# Patient Record
Sex: Male | Born: 1960 | Race: Black or African American | Hispanic: No | Marital: Married | Smoking: Former smoker
Health system: Southern US, Community
[De-identification: ages and names within clinical notes are randomized; demographics above are authoritative.]

## PROBLEM LIST (undated history)

## (undated) DIAGNOSIS — K760 Fatty (change of) liver, not elsewhere classified: Secondary | ICD-10-CM

## (undated) DIAGNOSIS — E785 Hyperlipidemia, unspecified: Secondary | ICD-10-CM

## (undated) DIAGNOSIS — K219 Gastro-esophageal reflux disease without esophagitis: Secondary | ICD-10-CM

## (undated) DIAGNOSIS — N400 Enlarged prostate without lower urinary tract symptoms: Secondary | ICD-10-CM

## (undated) DIAGNOSIS — E119 Type 2 diabetes mellitus without complications: Secondary | ICD-10-CM

## (undated) HISTORY — PX: HERNIA REPAIR: SHX51

## (undated) HISTORY — DX: Benign prostatic hyperplasia without lower urinary tract symptoms: N40.0

## (undated) HISTORY — DX: Gastro-esophageal reflux disease without esophagitis: K21.9

## (undated) HISTORY — PX: TREATMENT FISTULA ANAL: SUR1390

## (undated) HISTORY — PX: APPENDECTOMY: SHX54

## (undated) HISTORY — DX: Fatty (change of) liver, not elsewhere classified: K76.0

---

## 1999-05-28 ENCOUNTER — Ambulatory Visit (HOSPITAL_COMMUNITY): Admission: RE | Admit: 1999-05-28 | Discharge: 1999-05-28 | Payer: Self-pay | Admitting: *Deleted

## 2000-08-01 ENCOUNTER — Emergency Department (HOSPITAL_COMMUNITY): Admission: EM | Admit: 2000-08-01 | Discharge: 2000-08-01 | Payer: Self-pay | Admitting: Emergency Medicine

## 2002-02-12 ENCOUNTER — Encounter: Payer: Self-pay | Admitting: Emergency Medicine

## 2002-02-12 ENCOUNTER — Emergency Department (HOSPITAL_COMMUNITY): Admission: EM | Admit: 2002-02-12 | Discharge: 2002-02-12 | Payer: Self-pay

## 2002-02-14 ENCOUNTER — Encounter: Payer: Self-pay | Admitting: *Deleted

## 2002-02-14 ENCOUNTER — Ambulatory Visit (HOSPITAL_COMMUNITY): Admission: RE | Admit: 2002-02-14 | Discharge: 2002-02-14 | Payer: Self-pay

## 2006-06-13 ENCOUNTER — Emergency Department (HOSPITAL_COMMUNITY): Admission: EM | Admit: 2006-06-13 | Discharge: 2006-06-13 | Payer: Self-pay | Admitting: Emergency Medicine

## 2006-10-16 ENCOUNTER — Ambulatory Visit: Payer: Self-pay | Admitting: Family Medicine

## 2006-10-26 ENCOUNTER — Encounter: Payer: Self-pay | Admitting: Family Medicine

## 2006-10-27 ENCOUNTER — Encounter: Payer: Self-pay | Admitting: Family Medicine

## 2006-10-27 DIAGNOSIS — E785 Hyperlipidemia, unspecified: Secondary | ICD-10-CM

## 2006-10-27 LAB — CONVERTED CEMR LAB
Albumin: 4.2 g/dL (ref 3.5–5.2)
BUN: 12 mg/dL (ref 6–23)
Calcium: 9.4 mg/dL (ref 8.4–10.5)
Chloride: 101 meq/L (ref 96–112)
Glucose, Bld: 80 mg/dL (ref 70–99)
HDL: 41 mg/dL (ref >39)
PSA: 0.97 ng/mL (ref 0.10–4.00)
Potassium: 4.6 meq/L (ref 3.5–5.3)
Sodium: 138 meq/L (ref 135–145)
Total Protein: 7.8 g/dL (ref 6.0–8.3)
Triglycerides: 241 mg/dL — ABNORMAL HIGH (ref <150)

## 2007-11-08 ENCOUNTER — Ambulatory Visit: Payer: Self-pay | Admitting: Family Medicine

## 2007-11-13 ENCOUNTER — Encounter: Payer: Self-pay | Admitting: Family Medicine

## 2007-11-21 ENCOUNTER — Encounter: Payer: Self-pay | Admitting: Family Medicine

## 2007-11-21 LAB — CONVERTED CEMR LAB
ALT: 33 units/L (ref 0–53)
Albumin: 4.1 g/dL (ref 3.5–5.2)
BUN: 12 mg/dL (ref 6–23)
CO2: 23 meq/L (ref 19–32)
Calcium: 9.4 mg/dL (ref 8.4–10.5)
Chloride: 104 meq/L (ref 96–112)
Cholesterol, target level: 200 mg/dL
Cholesterol: 245 mg/dL — ABNORMAL HIGH (ref 0–200)
Creatinine, Ser: 0.85 mg/dL (ref 0.40–1.50)
HDL goal, serum: 40 mg/dL
LDL Goal: 160 mg/dL
PSA: 0.98 ng/mL (ref 0.10–4.00)
Potassium: 4.7 meq/L (ref 3.5–5.3)
Total CHOL/HDL Ratio: 6.4

## 2009-01-14 ENCOUNTER — Ambulatory Visit: Payer: Self-pay | Admitting: Family Medicine

## 2009-05-05 ENCOUNTER — Ambulatory Visit: Payer: Self-pay | Admitting: Family Medicine

## 2009-05-08 ENCOUNTER — Encounter: Payer: Self-pay | Admitting: Family Medicine

## 2009-05-08 LAB — CONVERTED CEMR LAB
ALT: 25 units/L (ref 0–53)
AST: 18 units/L (ref 0–37)
Albumin: 4.1 g/dL (ref 3.5–5.2)
Alkaline Phosphatase: 72 units/L (ref 39–117)
Glucose, Bld: 72 mg/dL (ref 70–99)
LDL Cholesterol: 163 mg/dL — ABNORMAL HIGH (ref 0–99)
Potassium: 4.8 meq/L (ref 3.5–5.3)
Sodium: 139 meq/L (ref 135–145)
Total Bilirubin: 0.6 mg/dL (ref 0.3–1.2)
Total Protein: 7.7 g/dL (ref 6.0–8.3)
VLDL: 42 mg/dL — ABNORMAL HIGH (ref 0–40)

## 2009-05-15 ENCOUNTER — Ambulatory Visit: Payer: Self-pay | Admitting: Family Medicine

## 2009-05-25 ENCOUNTER — Encounter: Admission: RE | Admit: 2009-05-25 | Discharge: 2009-06-17 | Payer: Self-pay | Admitting: Family Medicine

## 2009-05-25 ENCOUNTER — Telehealth: Payer: Self-pay | Admitting: Family Medicine

## 2009-05-25 ENCOUNTER — Encounter: Payer: Self-pay | Admitting: Family Medicine

## 2009-05-28 ENCOUNTER — Ambulatory Visit: Payer: Self-pay | Admitting: Family Medicine

## 2009-06-05 ENCOUNTER — Encounter (INDEPENDENT_AMBULATORY_CARE_PROVIDER_SITE_OTHER): Payer: Self-pay | Admitting: *Deleted

## 2009-06-05 ENCOUNTER — Ambulatory Visit: Payer: Self-pay | Admitting: Family Medicine

## 2009-06-05 ENCOUNTER — Encounter: Admission: RE | Admit: 2009-06-05 | Discharge: 2009-06-05 | Payer: Self-pay | Admitting: Family Medicine

## 2009-06-12 ENCOUNTER — Ambulatory Visit: Payer: Self-pay | Admitting: Family Medicine

## 2009-06-15 ENCOUNTER — Telehealth: Payer: Self-pay | Admitting: Family Medicine

## 2009-06-15 ENCOUNTER — Encounter: Payer: Self-pay | Admitting: Family Medicine

## 2009-06-16 ENCOUNTER — Encounter: Payer: Self-pay | Admitting: Family Medicine

## 2009-06-23 ENCOUNTER — Encounter: Payer: Self-pay | Admitting: Family Medicine

## 2009-07-30 ENCOUNTER — Encounter: Admission: RE | Admit: 2009-07-30 | Discharge: 2009-07-30 | Payer: Self-pay | Admitting: Orthopedic Surgery

## 2009-08-02 ENCOUNTER — Encounter: Admission: RE | Admit: 2009-08-02 | Discharge: 2009-08-02 | Payer: Self-pay | Admitting: Orthopedic Surgery

## 2009-10-19 ENCOUNTER — Telehealth (INDEPENDENT_AMBULATORY_CARE_PROVIDER_SITE_OTHER): Payer: Self-pay | Admitting: *Deleted

## 2009-10-22 ENCOUNTER — Encounter: Payer: Self-pay | Admitting: Family Medicine

## 2009-10-23 LAB — CONVERTED CEMR LAB
ALT: 25 units/L (ref 0–53)
AST: 19 units/L (ref 0–37)
CO2: 24 meq/L (ref 19–32)
Calcium: 9.2 mg/dL (ref 8.4–10.5)
Chloride: 103 meq/L (ref 96–112)
Cholesterol: 183 mg/dL (ref 0–200)
Creatinine, Ser: 0.87 mg/dL (ref 0.40–1.50)
Potassium: 4.5 meq/L (ref 3.5–5.3)
Sodium: 138 meq/L (ref 135–145)
Total CHOL/HDL Ratio: 4.3
Total Protein: 7 g/dL (ref 6.0–8.3)

## 2009-10-28 ENCOUNTER — Encounter: Payer: Self-pay | Admitting: Family Medicine

## 2009-11-26 ENCOUNTER — Ambulatory Visit: Payer: Self-pay | Admitting: Family

## 2009-11-26 LAB — CONVERTED CEMR LAB: Rapid Strep: NEGATIVE

## 2009-12-31 ENCOUNTER — Ambulatory Visit: Payer: Self-pay | Admitting: Family

## 2009-12-31 DIAGNOSIS — M5136 Other intervertebral disc degeneration, lumbar region: Secondary | ICD-10-CM | POA: Insufficient documentation

## 2009-12-31 DIAGNOSIS — IMO0002 Reserved for concepts with insufficient information to code with codable children: Secondary | ICD-10-CM

## 2010-02-05 ENCOUNTER — Telehealth: Payer: Self-pay | Admitting: Family Medicine

## 2010-02-05 ENCOUNTER — Encounter: Payer: Self-pay | Admitting: Family Medicine

## 2010-04-13 ENCOUNTER — Encounter
Admission: RE | Admit: 2010-04-13 | Discharge: 2010-05-26 | Payer: Self-pay | Source: Home / Self Care | Admitting: Neurological Surgery

## 2010-04-19 ENCOUNTER — Ambulatory Visit: Payer: Self-pay | Admitting: Family Medicine

## 2010-04-20 ENCOUNTER — Telehealth: Payer: Self-pay | Admitting: Family Medicine

## 2010-04-21 ENCOUNTER — Encounter: Payer: Self-pay | Admitting: Family Medicine

## 2010-04-22 LAB — CONVERTED CEMR LAB
CO2: 23 meq/L (ref 19–32)
Calcium: 9.9 mg/dL (ref 8.4–10.5)
Glucose, Bld: 85 mg/dL (ref 70–99)
Potassium: 4.6 meq/L (ref 3.5–5.3)
Sodium: 139 meq/L (ref 135–145)
Triglycerides: 226 mg/dL — ABNORMAL HIGH (ref <150)

## 2010-07-18 ENCOUNTER — Encounter: Payer: Self-pay | Admitting: Orthopedic Surgery

## 2010-07-29 NOTE — Assessment & Plan Note (Signed)
Summary: f/u on back pain- jr   Vital Signs:  Patient profile:   50 year old male Height:      67.5 inches Weight:      281 pounds BMI:     43.52 O2 Sat:      98 % on Room air Pulse rate:   90 / minute BP sitting:   121 / 66  (left arm) Cuff size:   large  Vitals Entered By: Payton Spark CMA (December 31, 2009 1:47 PM)  O2 Flow:  Room air CC: F/U on back pain. Would like referral to neuro surgery   Primary Care Provider:  Metheney, C  CC:  F/U on back pain. Would like referral to neuro surgery.  History of Present Illness: Frederick Yang is a 50 year old male who presents today with complaint of low back pain.  1) Low back pain- reports history of ruptured L5.  Review of E-chart notes MRI from 2/11  which notes foraminal and extraforaminal disc protrusions bilaterally L3-4 and L4-5 and on the left at L5-S1. His pain radiates down his right leg, nearly to his right foot.  Notes generalized weakness in his legs.  Pain initially started approximately 1 year ago.  Patient has been evaluated by Dr. Venita Lick and has undergone PT and ESI without significant improvment.  He is requesting a second opinion from Neurosurgery.  Reports pain today is 5/10-  he occasionally uses tramadol if severe.  Generally does not use medication.  2) Insomnia-has been using melatonin and tylenol PM.  Notes that he falls asleep and then will suddenly awaken- almost feels startled and like he is choking.  Drowsy during the day.  Current Medications (verified): 1)  Simvastatin 40 Mg Tabs (Simvastatin) .... Take 1 Tablet By Mouth Once A Day At Bedtime 2)  Fish Oil 1000 Mg Caps (Omega-3 Fatty Acids) .... 4 Tabs By Mouth Daily.  Allergies (verified): No Known Drug Allergies  Review of Systems       see HPI  Physical Exam  General:  Well-developed,well-nourished,in no acute distress; alert,appropriate and cooperative throughout examination Mouth:  narrow posterior pharynx. Neck:  thick neck Lungs:  Normal  respiratory effort, chest expands symmetrically. Lungs are clear to auscultation, no crackles or wheezes. Heart:  Normal rate and regular rhythm. S1 and S2 normal without gallop, murmur, click, rub or other extra sounds. Neurologic:  strength normal in all extremities.     Detailed Back/Spine Exam  Lumbosacral Exam:  Toe Walking:    Right:  normal    Left:  normal Heel Walking:    Right:  normal    Left:  normal   Impression & Recommendations:  Problem # 1:  BACK PAIN WITH RADICULOPATHY (ICD-729.2) Assessment Deteriorated  50 year old male with severe low back pain with radiculopathy.  Has undergone ESI and PT- now considering surgery, desires second opinion from Neurosurgery.  Will refer.    Orders: Neurosurgeon Referral (Neurosurgeon)  Problem # 2:  SLEEPINESS (ICD-780.09) Assessment: New  I am concerned about possibility of underlying sleep apnea in this patient given his complaints and body habitus.  Will schedule for a sleep study.    Orders: Sleep Disorder Referral (Sleep Disorder)  Complete Medication List: 1)  Simvastatin 40 Mg Tabs (Simvastatin) .... Take 1 tablet by mouth once a day at bedtime 2)  Fish Oil 1000 Mg Caps (Omega-3 fatty acids) .... 4 tabs by mouth daily.  Patient Instructions: 1)  You will be contacted about  your referral for a sleep study and for Neurosurgery.

## 2010-07-29 NOTE — Progress Notes (Signed)
Summary: Doesn' want sleep study  ---- Converted from flag ---- ---- 02/05/2010 1:43 PM, Frederick Bars DO wrote:   ---- 02/05/2010 1:43 PM, Frederick Yang wrote: Patient has decided he does not want his sleep study because he is sleeping better. He thinks it was due to anxiety issues he was having at the time. Thanks ------------------------------

## 2010-07-29 NOTE — Consult Note (Signed)
Summary: [facility]  [facility]   Imported By: Lanelle Bal 02/19/2010 12:41:19  _____________________________________________________________________  External Attachment:    Type:   Image     Comment:   External Document  Appended Document: [facility] Call pt: Neurosurg recommended PT.  Has then been scheduled?  Would he like [place] to schedule it for him?  02/24/2010 @ 11:03am- Pt notified of MD instructions via VM and instructed to call office back with answer. KJ LPN  Appended Document: [facility] 03/02/2010 @ 9:29am- Left several messages for pt to return call regarding whether PT had been set up for him or not and have not yet heard back from pt. KJ LPN

## 2010-07-29 NOTE — Letter (Signed)
Summary: Generic Letter  [facility]  [address], [address]   [place], [place] [address]   Phone: [phone]  Fax: [phone]    10/28/2009  [place] [facility] [address] [place], [place]  [address]  Dear Mr. Canale,   We have received your lab results and have made many attempts to call you but have not heard back from you. Your blood sugar is borderline elevated lets recheck this in 6 months. Cholesterol looks much better on the statin, but your triglycerides are still elevated. Recommend start Fish Oil 1000mg  4 capsules a day.        Sincerely,   Nani Gasser, MD

## 2010-07-29 NOTE — Progress Notes (Signed)
----   Converted from flag ---- ---- 10/19/2009 12:23 PM, Nani Gasser MD wrote: Call pt: Need to recheck lipids and liver before we refill cholesterol pill for 90 days supply ------------------------------  10/19/2009 @ 1:01pm-Pt notified of above. KJ LPN

## 2010-07-29 NOTE — Assessment & Plan Note (Signed)
Summary: sore throat- jr   Vital Signs:  Patient profile:   50 year old male Height:      67.5 inches Weight:      286 pounds Temp:     98.2 degrees F oral Pulse rate:   74 / minute BP sitting:   119 / 64  (left arm) Cuff size:   large  Vitals Entered By: Kathlene November (November 26, 2009 2:18 PM) CC: sore throat, a little drainage in the mornings, denies fever or cough. Sypmtoms since Monday   Primary Care Provider:  Linford Arnold, C  CC:  sore throat, a little drainage in the mornings, and denies fever or cough. Sypmtoms since Monday.  History of Present Illness: [name] is a 50 year old male who presents with complaint of sore throat which started 4 days ago.  Denies associated fever.  Sore throat is worse with swallowing.  Has been salt water gargling without improvement.  Noted some right ear discomfort last night.  Denies fever.  Denies known exposure to strep.  Notes some thick brown mucous in the morning only.    Current Medications (verified): 1)  Simvastatin 40 Mg Tabs (Simvastatin) .... Take 1 Tablet By Mouth Once A Day At Bedtime 2)  Fish Oil 1000 Mg Caps (Omega-3 Fatty Acids) .... 4 Tabs By Mouth Daily.  Allergies (verified): No Known Drug Allergies  Comments:  Nurse/Medical Assistant: The patient's medications and allergies were reviewed with the patient and were updated in the Medication and Allergy Lists. Kathlene November (November 26, 2009 2:19 PM)  Past History:  Past Medical History: Last updated: 10/16/2006 Hx of swollen prostate 10 years ago, now resolved.    Past Surgical History: Last updated: 10/16/2006 Right Groin Hernia 2003 Appendectomy 1993  Family History: Last updated: 10/16/2006 Uncle with alchoholism GF with Lung CA, smoker Mother with Br Ca Aunt with DM Uncle with MI, stroke Sibling with HTN  Social History: Last updated: 11/08/2007 Working at [employer].  HS degree. Married to [place] with 3 children.   Former Smoker Alcohol  use-yes Drug use-no  Regular exercise-yes  Risk Factors: Alcohol Use: <1 (10/16/2006) Caffeine Use: 0 (11/08/2007) Exercise: yes (11/08/2007)  Risk Factors: Smoking Status: quit (10/16/2006)  Physical Exam  General:  Well-developed,well-nourished,in no acute distress; alert,appropriate and cooperative throughout examination Eyes:  PERRLA Ears:  Mild erythema of left TM, R TM normal Mouth:  Oral mucosa and oropharynx without lesions or exudates.  Teeth in good repair. Neck:  No deformities, masses, or tenderness noted. Lungs:  Normal respiratory effort, chest expands symmetrically. Lungs are clear to auscultation, no crackles or wheezes. Heart:  Normal rate and regular rhythm. S1 and S2 normal without gallop, murmur, click, rub or other extra sounds.   Impression & Recommendations:  Problem # 1:  OTITIS MEDIA, ACUTE, LEFT (ICD-382.9) Assessment New Rapid strep is negative.  Will plan to treat OM with amoxicillin. The following medications were removed from the medication list:    Diclofenac Sodium 75 Mg Tbec (Diclofenac sodium) .Marland Kitchen... Take 1 tablet by mouth two times a day His updated medication list for this problem includes:    Amoxicillin 500 Mg Cap (Amoxicillin) .Marland Kitchen... Take 1 capsule by mouth three times a day x 10 days  Complete Medication List: 1)  Simvastatin 40 Mg Tabs (Simvastatin) .... Take 1 tablet by mouth once a day at bedtime 2)  Fish Oil 1000 Mg Caps (Omega-3 fatty acids) .... 4 tabs by mouth daily. 3)  Amoxicillin  500 Mg Cap (Amoxicillin) .... Take 1 capsule by mouth three times a day x 10 days  Other Orders: Rapid Strep ([address])  Patient Instructions: 1)  Gargle twice daily with salt water. 2)  Take Tylenol 650mg  every 6 hours as needed for pain 3)  You may use over the counter Cepacol lozenges or Chloraseptic spray as needed for sore throat. 4)  Call if you develop fever, worsening cough, if symptoms worsen, or if they do not  improve. Prescriptions: AMOXICILLIN 500 MG CAP (AMOXICILLIN) Take 1 capsule by mouth three times a day X 10 days  #30 x 0   Entered and Authorized by:   Lemont Fillers FNP   Signed by:   Lemont Fillers FNP on 11/26/2009   Method used:   Electronically to        [facility]* (retail)       [address] [place], [place]  [address]       Ph: [phone]       Fax: [phone]   RxID:   [id number]   Laboratory Results  Date/Time Received: 11/26/2009 Date/Time Reported: 11/26/2009  Other Tests  Rapid Strep: negative

## 2010-07-29 NOTE — Progress Notes (Signed)
Summary: Needs rx for the fish oil  Phone Note Call from Patient Call back at Home Phone [phone]   Caller: Patient Call For: Nani Gasser MD Summary of Call: Pt needs a rx called into pharmacy for the fish oil for his flex spending account to cover it. Send to [facility] Initial call taken by: Kathlene November LPN,  April 20, 2010 10:26 AM  Follow-up for Phone Call        OK, print and I will sign Follow-up by: Nani Gasser MD,  April 20, 2010 12:18 PM    Prescriptions: FISH OIL 1000 MG CAPS (OMEGA-3 FATTY ACIDS) 4 tabs by mouth daily.  #120 x 2   Entered by:   Kathlene November LPN   Authorized by:   Nani Gasser MD   Signed by:   Kathlene November LPN on 13/01/6577   Method used:   [phone]ail[facility]       [address]       [place], [place]  [address]       Ph: [phone] or [phone]       Fax: [phone]   RxID:   [id number]

## 2010-07-29 NOTE — Assessment & Plan Note (Signed)
Summary: CPE   Vital Signs:  Patient profile:   50 year old male Height:      67.5 inches Weight:      281 pounds Pulse rate:   74 / minute BP sitting:   116 / 75  (right arm) Cuff size:   large  Vitals Entered By: Avon Gully CMA, Duncan Dull) (April 19, 2010 1:02 PM) CC: CPE, need refill on simvistatin   Primary Care Provider:  Linford Arnold, C  CC:  CPE and need refill on simvistatin.  History of Present Illness: CPE, need refill on simvistatin. Started back to the gym about 2 months ago. Has had some back problems that affected his exercise as well. Currently doing PT for his back. He did up his Fish Oil back in May to help his TG.  Current Medications (verified): 1)  Simvastatin 40 Mg Tabs (Simvastatin) .... Take 1 Tablet By Mouth Once A Day At Bedtime 2)  Fish Oil 1000 Mg Caps (Omega-3 Fatty Acids) .... 4 Tabs By Mouth Daily.  Allergies (verified): No Known Drug Allergies  Comments:  Nurse/Medical Assistant: The patient's medications and allergies were reviewed with the patient and were updated in the Medication and Allergy Lists. Avon Gully CMA, Duncan Dull) (April 19, 2010 1:02 PM)  Past History:  Past Medical History: Last updated: 10/16/2006 Hx of swollen prostate 10 years ago, now resolved.    Past Surgical History: Last updated: 10/16/2006 Right Groin Hernia 2003 Appendectomy 1993  Family History: Last updated: 10/16/2006 Uncle with alchoholism GF with Lung CA, smoker Mother with Br Ca Aunt with DM Uncle with MI, stroke Sibling with HTN  Social History: Last updated: 11/08/2007 Working at [employer].  HS degree. Married to [place] with 3 children.   Former Smoker Alcohol use-yes Drug use-no  Regular exercise-yes  Review of Systems  The patient denies anorexia, fever, weight loss, weight gain, vision loss, decreased hearing, hoarseness, chest pain, syncope, dyspnea on exertion, peripheral edema, prolonged cough, headaches,  hemoptysis, abdominal pain, melena, hematochezia, severe indigestion/heartburn, hematuria, incontinence, genital sores, muscle weakness, suspicious skin lesions, transient blindness, difficulty walking, depression, unusual weight change, enlarged lymph nodes, angioedema, and testicular masses.         Notice an itching below his umbilicus.  No urinary sxs or rash.   Physical Exam  General:  Well-developed,well-nourished,in no acute distress; alert,appropriate and cooperative throughout examination Head:  Normocephalic and atraumatic without obvious abnormalities. No apparent alopecia or balding. Eyes:  No corneal or conjunctival inflammation noted. EOMI. Perrla.  Ears:  External ear exam shows no significant lesions or deformities.  Otoscopic examination reveals clear canals, tympanic membranes are intact bilaterally without bulging, retraction, inflammation or discharge. Hearing is grossly normal bilaterally. Nose:  External nasal examination shows no deformity or inflammation.  Mouth:  Oral mucosa and oropharynx without lesions or exudates.  Teeth in good repair. Neck:  No deformities, masses, or tenderness noted. Chest Wall:  No deformities, masses, tenderness or gynecomastia noted. Lungs:  Normal respiratory effort, chest expands symmetrically. Lungs are clear to auscultation, no crackles or wheezes. Heart:  Normal rate and regular rhythm. S1 and S2 normal without gallop, murmur, click, rub or other extra sounds. Abdomen:  Bowel sounds positive,abdomen soft and non-tender without masses, organomegaly or hernias noted. Msk:  No deformity or scoliosis noted of thoracic or lumbar spine.   Pulses:  R and L carotid,radial,dorsalis pedis and posterior tibial pulses are full and equal bilaterally Extremities:  No clubbing, cyanosis, edema, or deformity noted with normal  full range of motion of all joints.   Neurologic:  No cranial nerve deficits noted. Station and gait are normal. DTRs are  symmetrical throughout. Sensory, motor and coordinative functions appear intact. Skin:  no rashes.   Cervical Nodes:  No lymphadenopathy noted Psych:  Cognition and judgment appear intact. Alert and cooperative with normal attention span and concentration. No apparent delusions, illusions, hallucinations   Impression & Recommendations:  Problem # 1:  EXAMINATION, ROUTINE MEDICAL (ICD-V70.0) Exam is normal except for obesity Continue wtih his regular exercise. Encourage weight los and dec sweets, carbs as well Due to recheck TG and make sure at gaol in inc dose of Fish OIl.  Tdap given today. Getting flu vac through work.  Orders: T-Basic Metabolic Panel [id number]) T-Triglycerides [id number])  Complete Medication List: 1)  Simvastatin 40 Mg Tabs (Simvastatin) .... Take 1 tablet by mouth once a day at bedtime 2)  Fish Oil 1000 Mg Caps (Omega-3 fatty acids) .... 4 tabs by mouth daily.  Other Orders: Tdap => 50yrs IM (29562) Admin 1st Vaccine ([address]86)  Patient Instructions: 1)  It is important that you exercise reguarly at least 20 minutes 5 times a week. If you develop chest pain, have severe difficulty breathing, or feel very tired, stop exercising immediately and seek medical attention.  2)  You need to lose weight. Consider a lower calorie diet and regular exercise.   Contraindications/Deferment of Procedures/Staging:    Test/Procedure: FLU VAX    Reason for deferment: patient declined  Prescriptions: SIMVASTATIN 40 MG TABS (SIMVASTATIN) Take 1 tablet by mouth once a day at bedtime  #90 x 3   Entered and Authorized by:   Nani Gasser MD   Signed by:   Nani Gasser MD on 04/19/2010   Method used:   [facility]        [facility]  [address] [phone][facility]       [address] [place], [place]  [address]       Ph: [phone] or [phone]       Fax: [phone]   RxID:   [id number]    Orders Added: 1)  T-Basic Metabolic Panel  [[id number]] 2)  T-Triglycerides [10932-35573] 3)  Tdap => 63yrs IM [22025] 4)  Admin 1st Vaccine [90471] 5)  Est. Patient age 74-64 [99396]   Immunizations Administered:  Tetanus Vaccine:    Vaccine Type: Tdap    Site: left deltoid    Mfr: GlaxoSmithKline    Dose: 0.5 ml    Route: IM    Given by: Sue Lush McCrimmon CMA, (AAMA)    Exp. Date: 04/15/2012    Lot #: [id number]    VIS given: 05/14/08 version given April 19, 2010.   Immunizations Administered:  Tetanus Vaccine:    Vaccine Type: Tdap    Site: left deltoid    Mfr: GlaxoSmithKline    Dose: 0.5 ml    Route: IM    Given by: Sue Lush McCrimmon CMA, (AAMA)    Exp. Date: 04/15/2012    Lot #: [id number]    VIS given: 05/14/08 version given April 19, 2010.

## 2010-09-10 ENCOUNTER — Ambulatory Visit (INDEPENDENT_AMBULATORY_CARE_PROVIDER_SITE_OTHER): Payer: Private Health Insurance - Indemnity | Admitting: Family Medicine

## 2010-09-10 ENCOUNTER — Encounter: Payer: Self-pay | Admitting: Family Medicine

## 2010-09-10 DIAGNOSIS — M542 Cervicalgia: Secondary | ICD-10-CM

## 2010-09-10 DIAGNOSIS — IMO0002 Reserved for concepts with insufficient information to code with codable children: Secondary | ICD-10-CM

## 2010-09-10 DIAGNOSIS — M7541 Impingement syndrome of right shoulder: Secondary | ICD-10-CM | POA: Insufficient documentation
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
## 2011-01-29 ENCOUNTER — Encounter: Payer: Self-pay | Admitting: Family Medicine

## 2011-02-03 ENCOUNTER — Ambulatory Visit (INDEPENDENT_AMBULATORY_CARE_PROVIDER_SITE_OTHER): Payer: Private Health Insurance - Indemnity | Admitting: Family Medicine

## 2011-02-03 ENCOUNTER — Encounter: Payer: Self-pay | Admitting: Family Medicine

## 2011-02-03 VITALS — BP 118/70 | HR 61 | Ht 67.0 in | Wt 292.0 lb

## 2011-02-03 DIAGNOSIS — Z0000 Encounter for general adult medical examination without abnormal findings: Secondary | ICD-10-CM

## 2011-02-03 MED ORDER — PANTOPRAZOLE SODIUM 40 MG PO TBEC: 40.0000 mg | DELAYED_RELEASE_TABLET | Freq: Every day | ORAL | Status: DC

## 2011-02-03 NOTE — Patient Instructions (Signed)
Start a regular exercise program and make sure you are eating a healthy diet Try to eat 4 servings of dairy a day or take a calcium supplement (500mg  twice a day). Your vaccines are up to date.  We will call you with your lab results.

## 2011-02-03 NOTE — Progress Notes (Signed)
Subjective:    Patient ID: Frederick Yang, male    DOB: [birth year], 50 y.o.   MRN: [id number]  HPI Has concerns about reflux- Says has been having more frequent heartburn. Tried prilosec OTC for 2 weeks and it helped some but then a week later sxs recurred.    Insomnia - On and off for the last year.  Says initially took melatonin and that has helped. Says it got better and then recently started again.Says hard time falling asleep but has also been waking up feelings started. No nightmares or excessive dreams. No recent mood change or inc in stress.     Review of Systems Comprehensive ROS except for above.     BP 118/70  Pulse 61  Ht 5\' 7"  (1.702 m)  Wt 292 lb (132.45 kg)  BMI 45.73 kg/m2    No Known Allergies  Past Medical History  Diagnosis Date  . Prostate hypertrophy     Past Surgical History  Procedure Date  . Hernia repair     RT groin  . Appendectomy   . Treatment fistula anal     History   Social History  . Marital Status: Married    Spouse Name: N/A    Number of Children: 3   . Years of Education: N/A   Occupational History  .  [employer]   Social History Main Topics  . Smoking status: Former Smoker    Types: Cigarettes    Quit date: 06/27/1988  . Smokeless tobacco: Not on file  . Alcohol Use: Yes  . Drug Use: No  . Sexually Active: Not on file   Other Topics Concern  . Not on file   Social History Narrative   Started some exercise. Occ caffeine but not daily.     Family History  Problem Relation Age of Onset  . Breast cancer Mother 30  . Alcohol abuse Maternal Uncle   . Heart attack Maternal Uncle   . Stroke Maternal Uncle   . Cancer Maternal Grandfather     lung  . Diabetes Maternal Aunt   . Hypertension Brother   . Hypertension Sister   . Diabetes Sister     Mr. Frederick Yang does not currently have medications on file.  Objective:   Physical Exam  Constitutional: He is oriented to person, place, and time. He appears  well-developed and well-nourished.  HENT:  Head: Normocephalic and atraumatic.  Right Ear: External ear normal.  Left Ear: External ear normal.  Nose: Nose normal.  Mouth/Throat: Oropharynx is clear and moist.       TMs and canals are clear bilat   Eyes: Conjunctivae and EOM are normal. Pupils are equal, round, and reactive to light.  Neck: Normal range of motion. Neck supple. No thyromegaly present.  Cardiovascular: Normal rate, regular rhythm, normal heart sounds and intact distal pulses.        No carotid or abdominal bruits.   Pulmonary/Chest: Effort normal and breath sounds normal.  Abdominal: Soft. Bowel sounds are normal. He exhibits no distension and no mass. There is no tenderness. There is no rebound and no guarding.  Musculoskeletal: Normal range of motion. He exhibits no edema.  Lymphadenopathy:    He has no cervical adenopathy.  Neurological: He is alert and oriented to person, place, and time. He has normal reflexes.  Skin: Skin is warm and dry.  Psychiatric: He has a normal mood and affect. His behavior is normal. Judgment and thought content normal.  Assessment & Plan:  CPE -Overweight 50 yo male.   Note he has not taken his statin in about 2 weeks but has been taking his fish oil and garlic Due for labs today to recheck Cholesterol and liver.  BP looks great.  Start a regular exercise program and make sure you are eating a healthy diet Try to eat 4 servings of dairy a day or take a calcium supplement (500mg  twice a day). Your vaccines are up to date.  GERD - Will start a PPI and reviewed reflux hygeine Insomnia-Discussed restarting his melatonin since this worked well in the past and discussed consider a sleep study for abpnea since he snores, is obese and at risk for OSA.

## 2011-02-04 ENCOUNTER — Telehealth: Payer: Self-pay | Admitting: Family Medicine

## 2011-02-04 LAB — LIPID PANEL
LDL Cholesterol: 166 mg/dL — ABNORMAL HIGH (ref 0–99)
Total CHOL/HDL Ratio: 6.9 Ratio
VLDL: 41 mg/dL — ABNORMAL HIGH (ref 0–40)

## 2011-02-04 LAB — COMPLETE METABOLIC PANEL WITH GFR
AST: 21 U/L (ref 0–37)
Albumin: 4.3 g/dL (ref 3.5–5.2)
Alkaline Phosphatase: 68 U/L (ref 39–117)
Glucose, Bld: 95 mg/dL (ref 70–99)
Potassium: 4.8 mEq/L (ref 3.5–5.3)
Sodium: 135 mEq/L (ref 135–145)
Total Protein: 7.4 g/dL (ref 6.0–8.3)

## 2011-02-04 MED ORDER — SIMVASTATIN 40 MG PO TABS: 40.0000 mg | ORAL_TABLET | Freq: Every day | ORAL | Status: DC

## 2011-02-04 NOTE — Telephone Encounter (Signed)
Pt.notified

## 2011-02-04 NOTE — Telephone Encounter (Signed)
Call pt: Blood sugar, liver and kidneys look great!  Chol is high again. REstart statin. I will send over refills and recheck level in 8 weeks. l

## 2011-06-14 ENCOUNTER — Other Ambulatory Visit: Payer: Self-pay | Admitting: Family Medicine

## 2011-07-01 ENCOUNTER — Encounter: Payer: Self-pay | Admitting: *Deleted

## 2011-07-01 ENCOUNTER — Emergency Department (INDEPENDENT_AMBULATORY_CARE_PROVIDER_SITE_OTHER)
Admission: EM | Admit: 2011-07-01 | Discharge: 2011-07-01 | Disposition: A | Discharge status: Home / Self Care | Payer: Private Health Insurance - Indemnity | Source: Home / Self Care | Attending: Emergency Medicine | Admitting: Emergency Medicine

## 2011-07-01 ENCOUNTER — Emergency Department
Admit: 2011-07-01 | Discharge: 2011-07-01 | Disposition: A | Discharge status: Home / Self Care | Payer: Private Health Insurance - Indemnity

## 2011-07-01 DIAGNOSIS — M79672 Pain in left foot: Secondary | ICD-10-CM

## 2011-07-01 DIAGNOSIS — M79609 Pain in unspecified limb: Secondary | ICD-10-CM

## 2011-07-01 HISTORY — DX: Hyperlipidemia, unspecified: E78.5

## 2011-07-01 NOTE — ED Provider Notes (Signed)
History     CSN: [id number]  Arrival date & time 07/01/11  1632   First MD Initiated Contact with Patient 07/01/11 1652      Chief Complaint  Patient presents with  . Foot Pain    right    (Consider location/radiation/quality/duration/timing/severity/associated sxs/prior treatment) Patient is a 51 y.o. male presenting with lower extremity pain.  Foot Pain   about 2 weeks ago he stepped off a curb and felt that he stubbed or tweaked his foot. Since then he's been having pain in his left great toe. He thinks it may be a little bit swollen. No bruising. He has not tried any modalities or medications.  Past Medical History  Diagnosis Date  . Prostate hypertrophy   . Hyperlipidemia     Past Surgical History  Procedure Date  . Hernia repair     RT groin  . Appendectomy   . Treatment fistula anal     Family History  Problem Relation Age of Onset  . Breast cancer Mother 45  . Alcohol abuse Maternal Uncle   . Heart attack Maternal Uncle   . Stroke Maternal Uncle   . Cancer Maternal Grandfather     lung  . Diabetes Maternal Aunt   . Hypertension Brother   . Hypertension Sister   . Diabetes Sister     History  Substance Use Topics  . Smoking status: Former Smoker    Types: Cigarettes    Quit date: 06/27/1988  . Smokeless tobacco: Not on file  . Alcohol Use: Yes      Review of Systems  Allergies  Review of patient's allergies indicates no known allergies.  Home Medications   Current Outpatient Rx  Name Route Sig Dispense Refill  . PANTOPRAZOLE SODIUM 40 MG PO TBEC Oral Take 1 tablet (40 mg total) by mouth daily. 30 tablet 6  . SIMVASTATIN 40 MG PO TABS  TAKE 1 TABLET (40 MG TOTAL) BY MOUTH AT BEDTIME. 90 tablet 0    BP 118/78  Pulse 97  Temp(Src) 99 F (37.2 C) (Oral)  Resp 16  Ht 5\' 7"  (1.702 m)  Wt 303 lb (137.44 kg)  BMI 47.46 kg/m2  SpO2 96%  Physical Exam  Nursing note and vitals reviewed. Constitutional: He is oriented to person, place,  and time. He appears well-developed and well-nourished.  HENT:  Head: Normocephalic and atraumatic.  Eyes: No scleral icterus.  Neck: Neck supple.  Cardiovascular: Regular rhythm and normal heart sounds.   Pulmonary/Chest: Effort normal and breath sounds normal. No respiratory distress.  Musculoskeletal:       L ankle/foot: FROM, +TTP 1st MTP joint.  No pain with axial loading.   No TTP medial/lateral malleolus, navicular, base of 5th, calcaneus, Achilles, proximal fibula.  No swelling.  No ecchymoses.  Distal neurovascular status is intact.   Neurological: He is alert and oriented to person, place, and time.  Skin: Skin is warm and dry.  Psychiatric: He has a normal mood and affect. His speech is normal.    ED Course  Procedures (including critical care time)  Labs Reviewed - No data to display No results found.   1. Left foot pain       MDM   An x-ray was performed and read by radiology as above. Likely sprain.  Encourage rest, ice, compression with ACE bandage and/or a brace, and elevation of injured body part. A postop walking shoe was tried.  Differential diagnosis includes gout.  Lily Kocher, MD 07/01/11  1814 

## 2011-07-01 NOTE — ED Notes (Signed)
Patient stepped off curb and felt a pain in his left foot at the base of his great toe 2 weeks ago. No previous injury.

## 2011-08-03 ENCOUNTER — Encounter: Payer: Self-pay | Admitting: Family Medicine

## 2011-08-03 ENCOUNTER — Ambulatory Visit (INDEPENDENT_AMBULATORY_CARE_PROVIDER_SITE_OTHER): Payer: Private Health Insurance - Indemnity | Admitting: Family Medicine

## 2011-08-03 VITALS — BP 118/69 | HR 64 | Wt 295.0 lb

## 2011-08-03 DIAGNOSIS — E669 Obesity, unspecified: Secondary | ICD-10-CM | POA: Insufficient documentation

## 2011-08-03 DIAGNOSIS — R110 Nausea: Secondary | ICD-10-CM

## 2011-08-03 DIAGNOSIS — K625 Hemorrhage of anus and rectum: Secondary | ICD-10-CM

## 2011-08-03 MED ORDER — HYDROCORTISONE ACETATE 25 MG RE SUPP: 25.0000 mg | Freq: Two times a day (BID) | RECTAL | Status: AC

## 2011-08-03 NOTE — Patient Instructions (Signed)
Restart fiber to soften your stools.  Restart your protonix Call if not better in 2 weeks and we will you to a gastroenterologist.

## 2011-08-03 NOTE — Progress Notes (Signed)
  Subjective:    Patient ID: Frederick Yang, male    DOB: [birth year], 51 y.o.   MRN: [id number]  HPI Rectal bleeding for 6 days. During BM will notice Blood dripping into the toilet. Felt nauseated with it. No straining. NO constipation.  Says has been taking IBU bid  for her his foot regularly and was on a fast and only eating one meal a day about a week before this started.  Has now stopped his vitamins and IBU.  Has been feeling gassy. No hx of colonoscopy.  No fever.  Not sure if really any abdominal pain.  Hx of fistula and infection in the pst but was several years ago. No worsening or alleviating factors.   Review of Systems     Objective:   Physical Exam  Constitutional: He appears well-developed and well-nourished.  HENT:  Head: Normocephalic and atraumatic.  Cardiovascular: Normal rate and normal heart sounds.   Pulmonary/Chest: Effort normal and breath sounds normal.  Abdominal: Soft. Bowel sounds are normal. He exhibits no distension and no mass. There is no tenderness. There is no rebound and no guarding.  Genitourinary: Prostate normal. Guaiac negative stool.       No rectal masses. No external polyps.  There is a small tear at the 3:00 position.          Assessment & Plan:  Rectal bleeding- discussed likely secondary to the tear seen on exam. We will treat him with Anusol-HC over the next one to 2 weeks. Anchors to restart his diverticulitis somersaults is not aggravating the lesion. If he is still having blood in the stool at that point then please call the office or for him to gastroenterology for further evaluation. At this point, due to comparable to have an explanation for his bleeding. Check CBC and CMP today.  Nausea - May be secondary to the NSAID.  Restart the protonix.  I do not think this necessarily related to the rectal bleeding. If the nausea persists even on protonic after 2 weeks then please call the office an hour for her to get urology for further  evaluation.

## 2011-08-04 ENCOUNTER — Telehealth: Payer: Self-pay | Admitting: *Deleted

## 2011-08-04 LAB — COMPLETE METABOLIC PANEL WITH GFR
ALT: 30 U/L (ref 0–53)
AST: 20 U/L (ref 0–37)
CO2: 23 mEq/L (ref 19–32)
Chloride: 103 mEq/L (ref 96–112)
GFR, Est African American: >89 mL/min
Sodium: 136 mEq/L (ref 135–145)
Total Bilirubin: 0.4 mg/dL (ref 0.3–1.2)
Total Protein: 7.6 g/dL (ref 6.0–8.3)

## 2011-08-04 LAB — CBC WITH DIFFERENTIAL/PLATELET
Lymphocytes Relative: 42 % (ref 12–46)
Lymphs Abs: 2.9 10*3/uL (ref 0.7–4.0)
Neutrophils Relative %: 46 % (ref 43–77)
Platelets: 252 10*3/uL (ref 150–400)
RBC: 5.21 MIL/uL (ref 4.22–5.81)
WBC: 6.8 10*3/uL (ref 4.0–10.5)

## 2011-08-04 NOTE — Telephone Encounter (Signed)
LM for pt to returncall

## 2011-08-04 NOTE — Telephone Encounter (Signed)
Pt wants to know if you know where the tear on the rectum came from?

## 2011-08-04 NOTE — Progress Notes (Signed)
Quick Note:  All labs are normal. ______ 

## 2011-08-04 NOTE — Telephone Encounter (Signed)
Usually just a hard stool.

## 2011-08-05 NOTE — Telephone Encounter (Signed)
Pt informed

## 2011-10-14 ENCOUNTER — Other Ambulatory Visit: Payer: Self-pay | Admitting: Family Medicine

## 2011-10-21 ENCOUNTER — Encounter: Payer: Self-pay | Admitting: Family Medicine

## 2011-10-21 ENCOUNTER — Ambulatory Visit (INDEPENDENT_AMBULATORY_CARE_PROVIDER_SITE_OTHER): Payer: Private Health Insurance - Indemnity | Admitting: Family Medicine

## 2011-10-21 VITALS — BP 107/63 | HR 85 | Ht 67.0 in | Wt 283.0 lb

## 2011-10-21 DIAGNOSIS — M7710 Lateral epicondylitis, unspecified elbow: Secondary | ICD-10-CM

## 2011-10-21 MED ORDER — DICLOFENAC SODIUM 1 % TD GEL: 1.0000 "application " | Freq: Four times a day (QID) | TRANSDERMAL | Status: DC

## 2011-10-21 NOTE — Progress Notes (Signed)
  Subjective:    Patient ID: Frederick Yang, male    DOB: 02/17/1961, 51 y.o.   MRN: [id number]  HPI Left outer elbow pain for about 3 weeks. Has been icing it and heat but not helping. Also using a brace for support. Taking Motrin for pain.  Changed brakes on his car about 2-3 weeks ago. No weakness in th hand. Worse when reaches back for seatbelt.  No direct trauam.    Review of Systems     Objective:   Physical Exam  Constitutional: He is oriented to person, place, and time. He appears well-developed and well-nourished.  HENT:  Head: Normocephalic and atraumatic.  Cardiovascular:       Radial pulse on the right wrist is 2+.   Pulmonary/Chest: Effort normal and breath sounds normal.  Musculoskeletal:       Very tender over the lateral epicondyle. No significant swelling or erythema. He is nontender elsewhere on the elbow. He does have pain with pronation and supination. He has pain with dorsiflexion against resistance. Otherwise wrist and fingers with normal strength 5 over 5.  Neurological: He is alert and oriented to person, place, and time.  Skin: Skin is warm and dry.  Psychiatric: He has a normal mood and affect. His behavior is normal.          Assessment & Plan:  Lateral epicondylitis-discuss his diagnosis. Start icing frequently. He can take ibuprofen. I did give him prescription for Voltaren she'll that he can use in place of the ibuprofen if it's not too expensive. It is costly and switch back to the ibuprofen. Also gave him a handout on stretches to start doing home after he rested over the weekend. Also recommend getting a tennis elbow strap and explained to him how to apply it properly. Call or followup in 3-4 weeks if not making significant improvement in his pain. Consider injection, physical therapy or referral at that time. He has no sign of weakness in the hand.

## 2011-10-21 NOTE — Patient Instructions (Signed)
Tennis Elbow  Your caregiver has diagnosed you with a condition often referred to as "tennis elbow." This results from small tears or soreness (inflammation) at the start (origin) of the extensor muscles of the forearm. Although the condition is often called tennis or golfer's elbow, it is caused by any repetitive action performed by your elbow.  HOME CARE INSTRUCTIONS   If the condition has been short lived, rest may be the only treatment required. Using your opposite hand or arm to perform the task may help. Even changing your grip may help rest the extremity. These may even prevent the condition from recurring.   Longer standing problems, however, will often be relieved faster by:   Using anti-inflammatory agents.   Applying ice packs for 30 minutes at the end of the working day, at bed time, or when activities are finished.   Your caregiver may also have you wear a splint or sling. This will allow the inflamed tendon to heal.  At times, steroid injections aided with a local anesthetic will be required along with splinting for 1 to 2 weeks. Two to three steroid injections will often solve the problem. In some long standing cases, the inflamed tendon does not respond to conservative (non-surgical) therapy. Then surgery may be required to repair it.  MAKE SURE YOU:    Understand these instructions.   Will watch your condition.   Will get help right away if you are not doing well or get worse.  Document Released: 06/13/2005 Document Revised: 06/02/2011 Document Reviewed: 01/30/2008  [employer] Patient Information 2012 [employer].

## 2013-03-08 ENCOUNTER — Other Ambulatory Visit: Payer: Self-pay | Admitting: *Deleted

## 2013-03-08 ENCOUNTER — Telehealth: Payer: Self-pay | Admitting: Family Medicine

## 2013-03-08 DIAGNOSIS — Z0000 Encounter for general adult medical examination without abnormal findings: Secondary | ICD-10-CM

## 2013-03-08 NOTE — Telephone Encounter (Signed)
Patient has a cpe on 9/18 and would like an order for labwork before his appt.

## 2013-03-08 NOTE — Telephone Encounter (Signed)
Pt informed that labs have been ordered and faxed to the lab.Laureen Ochs, Viann Shove

## 2013-03-14 ENCOUNTER — Other Ambulatory Visit: Payer: Self-pay | Admitting: Family Medicine

## 2013-03-14 ENCOUNTER — Ambulatory Visit (INDEPENDENT_AMBULATORY_CARE_PROVIDER_SITE_OTHER): Payer: Private Health Insurance - Indemnity | Admitting: Family Medicine

## 2013-03-14 ENCOUNTER — Encounter: Payer: Self-pay | Admitting: Family Medicine

## 2013-03-14 VITALS — BP 119/68 | HR 68 | Ht 67.6 in | Wt 297.0 lb

## 2013-03-14 DIAGNOSIS — Z0000 Encounter for general adult medical examination without abnormal findings: Secondary | ICD-10-CM

## 2013-03-14 DIAGNOSIS — M7662 Achilles tendinitis, left leg: Secondary | ICD-10-CM

## 2013-03-14 DIAGNOSIS — R200 Anesthesia of skin: Secondary | ICD-10-CM

## 2013-03-14 DIAGNOSIS — R209 Unspecified disturbances of skin sensation: Secondary | ICD-10-CM

## 2013-03-14 DIAGNOSIS — Z1211 Encounter for screening for malignant neoplasm of colon: Secondary | ICD-10-CM

## 2013-03-14 DIAGNOSIS — M7660 Achilles tendinitis, unspecified leg: Secondary | ICD-10-CM

## 2013-03-14 LAB — COMPLETE METABOLIC PANEL WITH GFR
AST: 20 U/L (ref 0–37)
Albumin: 3.8 g/dL (ref 3.5–5.2)
BUN: 12 mg/dL (ref 6–23)
Calcium: 9.1 mg/dL (ref 8.4–10.5)
Chloride: 103 mEq/L (ref 96–112)
GFR, Est Non African American: >89 mL/min
Glucose, Bld: 111 mg/dL — ABNORMAL HIGH (ref 70–99)
Potassium: 4.8 mEq/L (ref 3.5–5.3)

## 2013-03-14 LAB — PSA, TOTAL AND FREE: PSA: 1.56 ng/mL (ref <=4.00)

## 2013-03-14 NOTE — Patient Instructions (Addendum)
Keep up a regular exercise program and make sure you are eating a healthy diet Try to eat 4 servings of dairy a day, or if you are lactose intolerant take a calcium with vitamin D daily.  Your vaccines are up to date.   

## 2013-03-14 NOTE — Progress Notes (Addendum)
Subjective:    Patient ID: Frederick Yang, male    DOB: May 12, 1961, 52 y.o.   MRN: [id number]  HPI Here for CPE - he has the following complaints as below. He declines flu vaccines if he will get at work this year.  Left hand numbness has been going on for severeal years on and off.  It happens intermittently. Driving seems to trigger it. It also happens at night. He does do a lot of small repetitive movements at work. Such as standing indwelling. He says he feels like it's gotten worse. He does occasionally have a tender spot on the medial side of left elbow and wonders if it could be coming from that. He's not really having any symptoms in the right hand. No other worsening or alleviating events. He says if he goes numb he just tries to move his hand and shake it around. He has tried Aleve and says does gets some mild relief.   Says occ gets numbness in the achilles area on the left foot as well. He had been working out at [employer] and change to a new parish use. He's not sure if this may have triggered it. He does not are a specific injury to the ankle. This started a couple months ago. He says he backed off his exercise and it got a little bit better. Aleve seems to help some as well but not significantly. He just frustrated that it's not completely gone away at this point in time. And wants to get back into his regular exercise routine. He says the worst pain is first thing in the morning when he first gets out of bed and walks on it. He says as he continues to move and stretch the area it actually feels a little bit better. He denies any weakness of the ankle.  Hx of back pain.  No neck pain currently   Review of Systems Comprehensive ROS neg except for HPI  BP 119/68  Pulse 68  Ht 5' 7.6" (1.717 m)  Wt 297 lb (134.718 kg)  BMI 45.7 kg/m2    No Known Allergies  Past Medical History  Diagnosis Date  . Prostate hypertrophy   . Hyperlipidemia     Past Surgical History  Procedure  Laterality Date  . Hernia repair      RT groin  . Appendectomy    . Treatment fistula anal      History   Social History  . Marital Status: Married    Spouse Name: N/A    Number of Children: 3   . Years of Education: N/A   Occupational History  .  [employer]   Social History Main Topics  . Smoking status: Former Smoker    Types: Cigarettes    Quit date: 06/27/1988  . Smokeless tobacco: Not on file  . Alcohol Use: Yes  . Drug Use: No  . Sexual Activity: Not on file   Other Topics Concern  . Not on file   Social History Narrative   Started some exercise. Occ caffeine but not daily.     Family History  Problem Relation Age of Onset  . Breast cancer Mother 74  . Alcohol abuse Maternal Uncle   . Heart attack Maternal Uncle   . Stroke Maternal Uncle   . Lung cancer Maternal Grandfather   . Diabetes Maternal Aunt   . Hypertension Brother   . Hypertension Sister   . Diabetes Sister   . Uterine cancer Mother  Outpatient Encounter Prescriptions as of 03/14/2013  Medication Sig Dispense Refill  . [DISCONTINUED] diclofenac sodium (VOLTAREN) 1 % GEL Apply 1 application topically 4 (four) times daily.  100 g  1  . [DISCONTINUED] pantoprazole (PROTONIX) 40 MG tablet Take 1 tablet (40 mg total) by mouth daily.  30 tablet  6  . [DISCONTINUED] simvastatin (ZOCOR) 40 MG tablet TAKE 1 TABLET (40 MG TOTAL) BY MOUTH AT BEDTIME.  90 tablet  0   No facility-administered encounter medications on file as of 03/14/2013.          Objective:   Physical Exam  Constitutional: He is oriented to person, place, and time. He appears well-developed and well-nourished.  HENT:  Head: Normocephalic and atraumatic.  Right Ear: External ear normal.  Left Ear: External ear normal.  Nose: Nose normal.  Mouth/Throat: Oropharynx is clear and moist.  Eyes: Conjunctivae and EOM are normal. Pupils are equal, round, and reactive to light.  Neck: Normal range of motion. Neck supple. No  thyromegaly present.  Cardiovascular: Normal rate, regular rhythm, normal heart sounds and intact distal pulses.   Pulmonary/Chest: Effort normal and breath sounds normal.  Abdominal: Soft. Bowel sounds are normal. He exhibits no distension and no mass. There is no tenderness. There is no rebound and no guarding.  Musculoskeletal: Normal range of motion.  Lymphadenopathy:    He has no cervical adenopathy.  Neurological: He is alert and oriented to person, place, and time. He has normal reflexes.  Skin: Skin is warm and dry.  Psychiatric: He has a normal mood and affect. His behavior is normal. Judgment and thought content normal.    He is tender over the posterior heel where the Achilles attaches. He is a little bit more tender laterally. Great strength with flexion-extension inversion and eversion of the foot. He has a positive Tinel's sign and negative Phalen's. Wrist with normal range of motion no swelling. Fingers with normal strength and range of motion.      Assessment & Plan:  CPE Keep up a regular exercise program and make sure you are eating a healthy diet Try to eat 4 servings of dairy a day, or if you are lactose intolerant take a calcium with vitamin D daily.  Your vaccines are up to date.  Labs pending including PSA.   Left hand numbness - Discussed possible dx of carpal tunnel vs nerve entrapment at the elbow. Could be from the neck as well as he does have a history of back pain. He's not currently having any neck pain. Would like to start by treating him for carpal tunnel since this is fairly easy to treat. Given a left cock-up splint to wear at bedtime for the next 3-4 weeks. If she's not improving significantly then consider EMG study to further delineate where the nerve is being pinched. We discussed this as a potential option if she's not improving.  BMI >40 (severe obesity)- I really want him to be would get back into his exercise routine so I really want to work on the  tendinitis issue below. He does note that he's been off of cholesterol medications for about a year and a half and has been taking supplements. He said when he had his cluster of checked earlier this year through work it actually looked pretty good.  Achilles tendinitis-discussed diagnosis. No sign of weakness of the ankles I do not suspect a significant tear. Handout given with stretches to do. Since he started been using Aleve but has not  been getting better over the last couple months with backing off of his exercise I would like for him to go ahead and see my partner Dr. Rodney Langton for further evaluation and treatment.

## 2013-03-18 ENCOUNTER — Ambulatory Visit (INDEPENDENT_AMBULATORY_CARE_PROVIDER_SITE_OTHER): Payer: Private Health Insurance - Indemnity | Admitting: Sports Medicine

## 2013-03-18 ENCOUNTER — Encounter: Payer: Self-pay | Admitting: Sports Medicine

## 2013-03-18 ENCOUNTER — Ambulatory Visit (INDEPENDENT_AMBULATORY_CARE_PROVIDER_SITE_OTHER): Payer: Private Health Insurance - Indemnity

## 2013-03-18 VITALS — BP 118/67 | HR 76 | Wt 297.0 lb

## 2013-03-18 DIAGNOSIS — M7660 Achilles tendinitis, unspecified leg: Secondary | ICD-10-CM

## 2013-03-18 DIAGNOSIS — M19049 Primary osteoarthritis, unspecified hand: Secondary | ICD-10-CM

## 2013-03-18 DIAGNOSIS — M7662 Achilles tendinitis, left leg: Secondary | ICD-10-CM | POA: Insufficient documentation

## 2013-03-18 DIAGNOSIS — G5602 Carpal tunnel syndrome, left upper limb: Secondary | ICD-10-CM | POA: Insufficient documentation

## 2013-03-18 DIAGNOSIS — G5600 Carpal tunnel syndrome, unspecified upper limb: Secondary | ICD-10-CM

## 2013-03-18 DIAGNOSIS — M79609 Pain in unspecified limb: Secondary | ICD-10-CM

## 2013-03-18 NOTE — Assessment & Plan Note (Signed)
Left first metacarpophalangeal joint. Continue Aleve. If no better in one month, we can inject. I do want some x-rays.

## 2013-03-18 NOTE — Progress Notes (Signed)
   Subjective:    I'm seeing this patient as a consultation for:  Dr. Linford Arnold  CC: Left ankle pain  HPI: This is a pleasant 52 year old male who comes in with a several week history of pain localized at the calcaneal insertion of the Achilles on the left side. He denies any trauma, change in his activity level. He is tried two Aleve twice a day without a response.  Pain is localized, doesn't radiate, mild to moderate.  Left wrist pain: Localized at the joint from the volar aspect, the pain radiates into the hand causing numbness and tingling in the second through fourth fingers. Worse in the mornings, no radiation from the neck, hurt sometimes into the forearm. PCP has been treating with a wrist brace.  Left thumb pain: Localized at the first metacarpophalangeal joint, mild swelling, no trauma.  Past medical history, Surgical history, Family history not pertinant except as noted below, Social history, Allergies, and medications have been entered into the medical record, reviewed, and no changes needed.   Review of Systems: No headache, visual changes, nausea, vomiting, diarrhea, constipation, dizziness, abdominal pain, skin rash, fevers, chills, night sweats, weight loss, swollen lymph nodes, body aches, joint swelling, muscle aches, chest pain, shortness of breath, mood changes, visual or auditory hallucinations.   Objective:   General: Well Developed, well nourished, and in no acute distress.  Neuro/Psych: Alert and oriented x3, extra-ocular muscles intact, able to move all 4 extremities, sensation grossly intact. Skin: Warm and dry, no rashes noted.  Respiratory: Not using accessory muscles, speaking in full sentences, trachea midline.  Cardiovascular: Pulses palpable, no extremity edema. Abdomen: Does not appear distended. Left Ankle: No visible erythema or swelling. Range of motion is full in all directions. Strength is 5/5 in all directions. Stable lateral and medial ligaments;  squeeze test and kleiger test unremarkable; Talar dome nontender; No pain at base of 5th MT; No tenderness over cuboid; No tenderness over N spot or navicular prominence No tenderness on posterior aspects of lateral and medial malleolus No sign of peroneal tendon subluxations or tenderness to palpation Negative tarsal tunnel tinel's Able to walk 4 steps. Left Wrist: Inspection normal with no visible erythema or swelling. ROM smooth and normal with good flexion and extension and ulnar/radial deviation that is symmetrical with opposite wrist. Palpation is normal over metacarpals, navicular, lunate, and TFCC; tendons without tenderness/ swelling No snuffbox tenderness. No tenderness over Canal of Guyon. Strength 5/5 in all directions without pain. Negative Finkelstein sign, negative Tinel's sign, positive Phalen's sign. Negative Watson's test.  Hand x-ray was reviewed, there is arthritis at the trapezial metacarpal joint, metacarpophalangeal joint looks okay.  Impression and Recommendations:   This case required medical decision making of moderate complexity.

## 2013-03-18 NOTE — Assessment & Plan Note (Signed)
Bilateral heel lift. Home eccentric rehabilitation. Continue Aleve. Return in 4 weeks, if no better we will proceed to formal physical therapy.

## 2013-03-18 NOTE — Assessment & Plan Note (Signed)
Continue bracing per PCP. He can followup with her. Should this be insufficient I would be happy to perform a guided median nerve hydrodissection/injection.

## 2013-03-18 NOTE — Patient Instructions (Addendum)
[facility] Begin with easy walking, heel, toe and backwards  Do calf raises on a step:  First lower and then raise on 1 foot  If this is painful, lower on 1 foot, do the heel raise on both feet Begin with 3 sets of 10 repetitions  Increase by 5 repetitions every 3 days  Goal is 3 sets of 30 repetitions  Do with both straight knee and knee at 20 degrees of flexion  If pain persists, once you can do 3 sets of 30 without weight, add backpack with 5 lbs.  Increase by 5 lbs per week to max of 30 lbs for 3 sets of 15 ; 

## 2013-03-19 LAB — LIPID PANEL
Cholesterol: 242 mg/dL — ABNORMAL HIGH (ref 0–200)
HDL: 44 mg/dL (ref >39)
LDL Cholesterol: 164 mg/dL — ABNORMAL HIGH (ref 0–99)
Total CHOL/HDL Ratio: 5.5 Ratio
VLDL: 34 mg/dL (ref 0–40)

## 2013-03-22 DIAGNOSIS — Z6838 Body mass index (BMI) 38.0-38.9, adult: Secondary | ICD-10-CM | POA: Insufficient documentation

## 2013-03-22 DIAGNOSIS — Z6839 Body mass index (BMI) 39.0-39.9, adult: Secondary | ICD-10-CM | POA: Insufficient documentation

## 2013-03-22 NOTE — Addendum Note (Signed)
Addended by: Nani Gasser D on: 03/22/2013 01:36 PM   Modules accepted: Level of Service

## 2013-04-12 ENCOUNTER — Telehealth: Payer: Self-pay

## 2013-04-15 ENCOUNTER — Ambulatory Visit (INDEPENDENT_AMBULATORY_CARE_PROVIDER_SITE_OTHER): Payer: Private Health Insurance - Indemnity | Admitting: Sports Medicine

## 2013-04-15 ENCOUNTER — Encounter: Payer: Self-pay | Admitting: Sports Medicine

## 2013-04-15 VITALS — BP 120/73 | HR 65 | Wt 299.0 lb

## 2013-04-15 DIAGNOSIS — M7660 Achilles tendinitis, unspecified leg: Secondary | ICD-10-CM

## 2013-04-15 DIAGNOSIS — G5602 Carpal tunnel syndrome, left upper limb: Secondary | ICD-10-CM

## 2013-04-15 DIAGNOSIS — M19049 Primary osteoarthritis, unspecified hand: Secondary | ICD-10-CM

## 2013-04-15 DIAGNOSIS — M7662 Achilles tendinitis, left leg: Secondary | ICD-10-CM

## 2013-04-15 DIAGNOSIS — G5600 Carpal tunnel syndrome, unspecified upper limb: Secondary | ICD-10-CM

## 2013-04-15 MED ORDER — NITROGLYCERIN 0.2 MG/HR TD PT24: MEDICATED_PATCH | TRANSDERMAL | Status: DC

## 2013-04-15 NOTE — Assessment & Plan Note (Signed)
40% improvement with bracing. Ultrasound guided hyper dissection of the median nerve as above. Return in one month.

## 2013-04-15 NOTE — Assessment & Plan Note (Addendum)
85 percent improved, and continuing to improve, continue NSAIDs, and home exercises. Adding nitroglycerin protocol.

## 2013-04-15 NOTE — Progress Notes (Signed)
  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  Carpal tunnel syndrome: 40% improved with bracing, and exercises, desires interventional treatment.  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  Past medical history, Surgical history, Family history not pertinant except as noted below, Social history, Allergies, and medications have been entered into the medical record, reviewed, and no changes needed.   Review of Systems: No fevers, chills, night sweats, weight loss, chest pain, or shortness of breath.   Objective:    General: Well Developed, well nourished, and in no acute distress.  Neuro: Alert and oriented x3, extra-ocular muscles intact, sensation grossly intact.  HEENT: Normocephalic, atraumatic, pupils equal round reactive to light, neck supple, no masses, no lymphadenopathy, thyroid nonpalpable.  Skin: Warm and dry, no rashes. Cardiac: Regular rate and rhythm, no murmurs rubs or gallops, no lower extremity edema.  Respiratory: Clear to auscultation bilaterally. Not using accessory muscles, speaking in full sentences.  Procedure: Real-time Ultrasound Guided Injection of left first metacarpophalangeal joint Device: GE Logiq E  Verbal informed consent obtained.  Time-out conducted.  Noted no overlying erythema, induration, or other signs of local infection.  Skin prepped in a sterile fashion.  Local anesthesia: Topical Ethyl chloride.  With sterile technique and under real time ultrasound guidance:  0.5 cc Kenalog 40, 0.5 cc lidocaine injected easily. Completed without difficulty  Pain immediately resolved suggesting accurate placement of the medication.  Advised to call if fevers/chills, erythema, induration, drainage, or persistent bleeding.  Images permanently stored and available for review in the ultrasound unit.  Impression: Technically successful  ultrasound guided injection.  Procedure: Real-time Ultrasound Guided Injection of left carpal tunnel median nerve hydrodissection Device: GE Logiq E  Verbal informed consent obtained.  Time-out conducted.  Noted no overlying erythema, induration, or other signs of local infection.  Skin prepped in a sterile fashion.  Local anesthesia: Topical Ethyl chloride.  With sterile technique and under real time ultrasound guidance:  25-gauge needle used to inject a total of 1 cc Kenalog 40, 5 cc lidocaine to hydrodissect the median nerve from surrounding structures. Completed without difficulty  Pain immediately resolved suggesting accurate placement of the medication.  Advised to call if fevers/chills, erythema, induration, drainage, or persistent bleeding.  Images permanently stored and available for review in the ultrasound unit.  Impression: Technically successful ultrasound guided injection.  Impression and Recommendations:

## 2013-04-15 NOTE — Telephone Encounter (Signed)
error 

## 2013-04-15 NOTE — Assessment & Plan Note (Signed)
No improvement with NSAIDs, guided injection into the left first metacarpophalangeal joint as above. Return in one month.

## 2013-04-29 ENCOUNTER — Ambulatory Visit (INDEPENDENT_AMBULATORY_CARE_PROVIDER_SITE_OTHER): Payer: Private Health Insurance - Indemnity | Admitting: Sports Medicine

## 2013-04-29 ENCOUNTER — Encounter: Payer: Self-pay | Admitting: Sports Medicine

## 2013-04-29 DIAGNOSIS — M7662 Achilles tendinitis, left leg: Secondary | ICD-10-CM

## 2013-04-29 DIAGNOSIS — G5602 Carpal tunnel syndrome, left upper limb: Secondary | ICD-10-CM

## 2013-04-29 DIAGNOSIS — G5600 Carpal tunnel syndrome, unspecified upper limb: Secondary | ICD-10-CM

## 2013-04-29 DIAGNOSIS — M19049 Primary osteoarthritis, unspecified hand: Secondary | ICD-10-CM

## 2013-04-29 DIAGNOSIS — M7660 Achilles tendinitis, unspecified leg: Secondary | ICD-10-CM

## 2013-04-29 NOTE — Assessment & Plan Note (Signed)
85% better after left first metacarpophalangeal joint injection.

## 2013-04-29 NOTE — Progress Notes (Signed)
  Subjective:    CC: Followup  HPI: Left carpal tunnel syndrome: 85% improved after median nerve hydrodissection. He has not been using his nighttime splints.  Hand osteoarthritis:  85% improved after left first metacarpophalangeal joint injection.   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  Past medical history, Surgical history, Family history not pertinant except as noted below, Social history, Allergies, and medications have been entered into the medical record, reviewed, and no changes needed.   Review of Systems: No fevers, chills, night sweats, weight loss, chest pain, or shortness of breath.   Objective:    General: Well Developed, well nourished, and in no acute distress.  Neuro: Alert and oriented x3, extra-ocular muscles intact, sensation grossly intact.  HEENT: Normocephalic, atraumatic, pupils equal round reactive to light, neck supple, no masses, no lymphadenopathy, thyroid nonpalpable.  Skin: Warm and dry, no rashes. Cardiac: Regular rate and rhythm, no murmurs rubs or gallops, no lower extremity edema.  Respiratory: Clear to auscultation bilaterally. Not using accessory muscles, speaking in full sentences.  Impression and Recommendations:

## 2013-04-29 NOTE — Assessment & Plan Note (Signed)
95% better with home exercises, heel lifts, and nitroglycerin patches. Return as needed for this.

## 2013-04-29 NOTE — Assessment & Plan Note (Signed)
85% better, decreased numbness and tingling, no nocturnal symptoms one month after Hydro dissection. Needs to wear nighttime splints. Return as needed.

## 2013-06-19 ENCOUNTER — Encounter: Payer: Self-pay | Admitting: Family Medicine

## 2013-06-24 ENCOUNTER — Encounter: Payer: Self-pay | Admitting: Family Medicine

## 2013-06-24 HISTORY — PX: HEMORRHOID BANDING: SHX5850

## 2013-07-12 HISTORY — PX: HEMORRHOID BANDING: SHX5850

## 2013-07-15 ENCOUNTER — Encounter: Payer: Self-pay | Admitting: Family Medicine

## 2013-09-12 ENCOUNTER — Encounter: Payer: Self-pay | Admitting: Family Medicine

## 2013-10-24 ENCOUNTER — Encounter: Payer: Self-pay | Admitting: Sports Medicine

## 2013-10-24 ENCOUNTER — Ambulatory Visit (INDEPENDENT_AMBULATORY_CARE_PROVIDER_SITE_OTHER): Payer: Private Health Insurance - Indemnity | Admitting: Sports Medicine

## 2013-10-24 VITALS — BP 110/63 | HR 70 | Ht 67.0 in | Wt 297.0 lb

## 2013-10-24 DIAGNOSIS — M25819 Other specified joint disorders, unspecified shoulder: Secondary | ICD-10-CM

## 2013-10-24 DIAGNOSIS — M19049 Primary osteoarthritis, unspecified hand: Secondary | ICD-10-CM

## 2013-10-24 DIAGNOSIS — M7541 Impingement syndrome of right shoulder: Secondary | ICD-10-CM

## 2013-10-24 DIAGNOSIS — G5600 Carpal tunnel syndrome, unspecified upper limb: Secondary | ICD-10-CM

## 2013-10-24 DIAGNOSIS — G5602 Carpal tunnel syndrome, left upper limb: Secondary | ICD-10-CM

## 2013-10-24 DIAGNOSIS — M7580 Other shoulder lesions, unspecified shoulder: Secondary | ICD-10-CM

## 2013-10-24 NOTE — Assessment & Plan Note (Signed)
Repeat median nerve hydrodissection, last one was 6 months ago.

## 2013-10-24 NOTE — Assessment & Plan Note (Signed)
Only little the pain has returned since his left first metacarpophalangeal joint injection 6 months ago. He is not yet ready for another shot.

## 2013-10-24 NOTE — Assessment & Plan Note (Signed)
Has done physical therapy, NSAIDs, still having pain. Subacromial injection as above. Continue rehabilitation, return in one month.

## 2013-10-24 NOTE — Progress Notes (Signed)
  Subjective:    CC: Recheck hand pain  HPI: Left first metatarsophalangeal joint osteoarthritis : doing well after injection, only having a bit of pain, does not desire repeat injection.  Left carpal tunnel syndrome: Six-month response to median nerve hydrodissection, desires a repeat.  Right shoulder pain: Localized over the deltoid, moderate, persistent, worse with overhead activities.  Past medical history, Surgical history, Family history not pertinant except as noted below, Social history, Allergies, and medications have been entered into the medical record, reviewed, and no changes needed.   Review of Systems: No fevers, chills, night sweats, weight loss, chest pain, or shortness of breath.   Objective:    General: Well Developed, well nourished, and in no acute distress.  Neuro: Alert and oriented x3, extra-ocular muscles intact, sensation grossly intact.  HEENT: Normocephalic, atraumatic, pupils equal round reactive to light, neck supple, no masses, no lymphadenopathy, thyroid nonpalpable.  Skin: Warm and dry, no rashes. Cardiac: Regular rate and rhythm, no murmurs rubs or gallops, no lower extremity edema.  Respiratory: Clear to auscultation bilaterally. Not using accessory muscles, speaking in full sentences. Right Shoulder: Inspection reveals no abnormalities, atrophy or asymmetry. Palpation is normal with no tenderness over AC joint or bicipital groove. ROM is full in all planes. Rotator cuff strength normal throughout. Positive Neer and Hawkin's tests, empty can sign. Speeds and Yergason's tests normal. No labral pathology noted with negative Obrien's, negative clunk and good stability. Normal scapular function observed. No painful arc and no drop arm sign. No apprehension sign  Procedure: Real-time Ultrasound Guided Injection of left median nerve hydrodissection Device: GE Logiq E  Verbal informed consent obtained.  Time-out conducted.  Noted no overlying  erythema, induration, or other signs of local infection.  Skin prepped in a sterile fashion.  Local anesthesia: Topical Ethyl chloride.  With sterile technique and under real time ultrasound guidance:  Using a 25-gauge needle a total of 1 cc Kenalog 40, 4 cc lidocaine was injected both superficial 2, and deep to the median nerve, hydrodissecting it from the surrounding structures. Completed without difficulty  Pain immediately resolved suggesting accurate placement of the medication.  Advised to call if fevers/chills, erythema, induration, drainage, or persistent bleeding.  Images permanently stored and available for review in the ultrasound unit.  Impression: Technically successful ultrasound guided injection.  Procedure: Real-time Ultrasound Guided Injection of right subacromial bursa Device: GE Logiq E  Verbal informed consent obtained.  Time-out conducted.  Noted no overlying erythema, induration, or other signs of local infection.  Skin prepped in a sterile fashion.  Local anesthesia: Topical Ethyl chloride.  With sterile technique and under real time ultrasound guidance:  1 cc Kenalog 40, 4 cc lidocaine injected into a visibly distended subacromial bursa. Completed without difficulty  Pain immediately resolved suggesting accurate placement of the medication.  Advised to call if fevers/chills, erythema, induration, drainage, or persistent bleeding.  Images permanently stored and available for review in the ultrasound unit.  Impression: Technically successful ultrasound guided injection.  Impression and Recommendations:

## 2013-11-22 ENCOUNTER — Ambulatory Visit: Payer: Private Health Insurance - Indemnity | Admitting: Sports Medicine

## 2013-11-29 ENCOUNTER — Ambulatory Visit (INDEPENDENT_AMBULATORY_CARE_PROVIDER_SITE_OTHER): Payer: Private Health Insurance - Indemnity | Admitting: Sports Medicine

## 2013-11-29 ENCOUNTER — Encounter: Payer: Self-pay | Admitting: Sports Medicine

## 2013-11-29 VITALS — BP 145/83 | HR 77 | Ht 67.0 in | Wt 296.0 lb

## 2013-11-29 DIAGNOSIS — M25819 Other specified joint disorders, unspecified shoulder: Secondary | ICD-10-CM

## 2013-11-29 DIAGNOSIS — M7541 Impingement syndrome of right shoulder: Secondary | ICD-10-CM

## 2013-11-29 DIAGNOSIS — G5602 Carpal tunnel syndrome, left upper limb: Secondary | ICD-10-CM

## 2013-11-29 DIAGNOSIS — M7580 Other shoulder lesions, unspecified shoulder: Secondary | ICD-10-CM

## 2013-11-29 DIAGNOSIS — G5600 Carpal tunnel syndrome, unspecified upper limb: Secondary | ICD-10-CM

## 2013-11-29 NOTE — Assessment & Plan Note (Signed)
Improved after injection, I do want him to wear a low profile extension brace at work as well as at night, it does not need to mobilize his thumb.

## 2013-11-29 NOTE — Assessment & Plan Note (Signed)
Impingement symptoms resolved after initial subacromial injection. Symptoms now are predominantly referrable to the glenohumeral joint and the acromioclavicular joint. I placed injections into both the glenohumeral and the acromioclavicular joints today. These were effective, and confirmed that they are also in generators.

## 2013-11-29 NOTE — Progress Notes (Signed)
   Subjective:    CC: Follow up  HPI: Carpal tunnel syndrome: Nearly completely resolved after ultrasound-guided hydrodissection one month ago, continues to have mild symptoms, is not wearing his splint at work.  Right shoulder pain: All impingement symptoms have resolved after subacromial injection last month, unfortunately starting to have pain at the glenohumeral joint and at the a.c. joint.  Past medical history, Surgical history, Family history not pertinant except as noted below, Social history, Allergies, and medications have been entered into the medical record, reviewed, and no changes needed.   Review of Systems: No fevers, chills, night sweats, weight loss, chest pain, or shortness of breath.   Objective:    General: Well Developed, well nourished, and in no acute distress.  Neuro: Alert and oriented x3, extra-ocular muscles intact, sensation grossly intact.  HEENT: Normocephalic, atraumatic, pupils equal round reactive to light, neck supple, no masses, no lymphadenopathy, thyroid nonpalpable.  Skin: Warm and dry, no rashes. Cardiac: Regular rate and rhythm, no murmurs rubs or gallops, no lower extremity edema.  Respiratory: Clear to auscultation bilaterally. Not using accessory muscles, speaking in full sentences. Right Shoulder: Inspection reveals no abnormalities, atrophy or asymmetry. Tender to palpation over the acromioclavicular joint ROM is full in all planes. Rotator cuff strength normal throughout. No signs of impingement with negative Neer and Hawkin's tests, empty can sign. Speeds and Yergason's tests normal. Positive crank test. Normal scapular function observed. No painful arc and no drop arm sign. No apprehension sign  Procedure: Real-time Ultrasound Guided Injection of right acromioclavicular joint Device: GE Logiq E  Verbal informed consent obtained.  Time-out conducted.  Noted no overlying erythema, induration, or other signs of local infection.    Skin prepped in a sterile fashion.  Local anesthesia: Topical Ethyl chloride.  With sterile technique and under real time ultrasound guidance:  0.5 cc Kenalog 40, 0.5 cc lidocaine injected easily. Completed without difficulty  Pain immediately resolved suggesting accurate placement of the medication.  Advised to call if fevers/chills, erythema, induration, drainage, or persistent bleeding.  Images permanently stored and available for review in the ultrasound unit.  Impression: Technically successful ultrasound guided injection.  Procedure: Real-time Ultrasound Guided Injection of right glenohumeral joint Device: GE Logiq E  Verbal informed consent obtained.  Time-out conducted.  Noted no overlying erythema, induration, or other signs of local infection.  Skin prepped in a sterile fashion.  Local anesthesia: Topical Ethyl chloride.  With sterile technique and under real time ultrasound guidance:  1 cc Kenalog 44 cc lidocaine injected easily. Completed without difficulty  Pain immediately resolved suggesting accurate placement of the medication.  Advised to call if fevers/chills, erythema, induration, drainage, or persistent bleeding.  Images permanently stored and available for review in the ultrasound unit.  Impression: Technically successful ultrasound guided injection.  Impression and Recommendations:

## 2013-12-30 ENCOUNTER — Ambulatory Visit (INDEPENDENT_AMBULATORY_CARE_PROVIDER_SITE_OTHER): Payer: Private Health Insurance - Indemnity | Admitting: Sports Medicine

## 2013-12-30 ENCOUNTER — Ambulatory Visit (INDEPENDENT_AMBULATORY_CARE_PROVIDER_SITE_OTHER): Payer: Private Health Insurance - Indemnity

## 2013-12-30 ENCOUNTER — Encounter: Payer: Self-pay | Admitting: Sports Medicine

## 2013-12-30 VITALS — BP 131/76 | HR 79 | Ht 67.0 in | Wt 299.0 lb

## 2013-12-30 DIAGNOSIS — M25819 Other specified joint disorders, unspecified shoulder: Secondary | ICD-10-CM

## 2013-12-30 DIAGNOSIS — M7541 Impingement syndrome of right shoulder: Secondary | ICD-10-CM

## 2013-12-30 DIAGNOSIS — M7580 Other shoulder lesions, unspecified shoulder: Secondary | ICD-10-CM

## 2013-12-30 DIAGNOSIS — R9389 Abnormal findings on diagnostic imaging of other specified body structures: Secondary | ICD-10-CM

## 2013-12-30 MED ORDER — DIAZEPAM 10 MG PO TABS: ORAL_TABLET | ORAL | Status: DC

## 2013-12-30 MED ORDER — TRAMADOL HCL 50 MG PO TABS: ORAL_TABLET | ORAL | Status: DC

## 2013-12-30 NOTE — Progress Notes (Signed)
  Subjective:    CC: Followup  HPI: Right shoulder pain: Initially started with impingement type symptoms, a subacromial injection provided good relief. He then had some pain over the a.c. joint and the glenohumeral joint, these were injected under guidance but unfortunately his pain is persistent. The  Past medical history, Surgical history, Family history not pertinant except as noted below, Social history, Allergies, and medications have been entered into the medical record, reviewed, and no changes needed.   Review of Systems: No fevers, chills, night sweats, weight loss, chest pain, or shortness of breath.   Objective:    General: Well Developed, well nourished, and in no acute distress.  Neuro: Alert and oriented x3, extra-ocular muscles intact, sensation grossly intact.  HEENT: Normocephalic, atraumatic, pupils equal round reactive to light, neck supple, no masses, no lymphadenopathy, thyroid nonpalpable.  Skin: Warm and dry, no rashes. Cardiac: Regular rate and rhythm, no murmurs rubs or gallops, no lower extremity edema.  Respiratory: Clear to auscultation bilaterally. Not using accessory muscles, speaking in full sentences.  Shoulder x-ray shows acromioclavicular degenerative changes.  Impression and Recommendations:

## 2013-12-30 NOTE — Assessment & Plan Note (Signed)
Frederick Yang has now had acromioclavicular, subacromial, and glenohumeral joint injections as well as physical therapy. Continues to have pain, MRI, return to see results. He will need an x-ray as well in the meantime. Tramadol for pain in the meantime, Valium for preprocedural anxiolysis for claustrophobia.

## 2013-12-31 ENCOUNTER — Telehealth: Payer: Self-pay | Admitting: *Deleted

## 2013-12-31 NOTE — Telephone Encounter (Signed)
I called and lmom for Hezakiah to return call regarding his insurance. I attempted to state auth and [employer] portal is unable to identify or locate. Need new insurance card. Margette Fast, CMA

## 2014-01-01 ENCOUNTER — Telehealth: Payer: Self-pay | Admitting: *Deleted

## 2014-01-01 NOTE — Telephone Encounter (Signed)
No prior auth required as per [facility] @ [employer] and [facility] for CPT [phone]. As per [facility] must have study done at main campus because they are in network with his policy. [place] notified. Margette Fast, CMA

## 2014-01-22 ENCOUNTER — Ambulatory Visit (HOSPITAL_COMMUNITY)
Admission: RE | Admit: 2014-01-22 | Discharge: 2014-01-22 | Disposition: A | Discharge status: Home / Self Care | Payer: Private Health Insurance - Indemnity | Source: Ambulatory Visit | Attending: Sports Medicine | Admitting: Sports Medicine

## 2014-01-22 ENCOUNTER — Telehealth: Payer: Self-pay

## 2014-01-22 DIAGNOSIS — M249 Joint derangement, unspecified: Secondary | ICD-10-CM | POA: Diagnosis not present

## 2014-01-22 DIAGNOSIS — M719 Bursopathy, unspecified: Principal | ICD-10-CM | POA: Insufficient documentation

## 2014-01-22 DIAGNOSIS — T1590XS Foreign body on external eye, part unspecified, unspecified eye, sequela: Secondary | ICD-10-CM

## 2014-01-22 DIAGNOSIS — M7541 Impingement syndrome of right shoulder: Secondary | ICD-10-CM

## 2014-01-22 DIAGNOSIS — M19019 Primary osteoarthritis, unspecified shoulder: Secondary | ICD-10-CM | POA: Insufficient documentation

## 2014-01-22 DIAGNOSIS — M67919 Unspecified disorder of synovium and tendon, unspecified shoulder: Secondary | ICD-10-CM | POA: Insufficient documentation

## 2014-01-22 DIAGNOSIS — M25819 Other specified joint disorders, unspecified shoulder: Secondary | ICD-10-CM | POA: Diagnosis present

## 2014-01-22 NOTE — Telephone Encounter (Signed)
Frederick Yang from [facility] called wanted Xray ordered for foreign body in eye. [name],CMA

## 2014-01-28 ENCOUNTER — Telehealth: Payer: Self-pay

## 2014-01-28 DIAGNOSIS — M7541 Impingement syndrome of right shoulder: Secondary | ICD-10-CM

## 2014-01-28 MED ORDER — DIAZEPAM 10 MG PO TABS: ORAL_TABLET | ORAL | Status: DC

## 2014-01-28 NOTE — Telephone Encounter (Signed)
Frederick Yang called and stated that he went to go have his MRI done last week but there was some complications. His MRI is rescheduled for the 13 th. He would like to know if he can get another rx for the Valium to get him through the MRI procedure/Kanna Dafoe,CMA

## 2014-01-28 NOTE — Telephone Encounter (Signed)
New prescription for Valium is in my box.

## 2014-01-29 NOTE — Telephone Encounter (Signed)
LMOM for return call for which pharmacy to fax Rx to,/Ridley Schewe,CMA

## 2014-01-29 NOTE — Telephone Encounter (Signed)
Rx faxed to [facility] and pt informed./Mata Rowen,CMA

## 2014-02-06 ENCOUNTER — Ambulatory Visit (HOSPITAL_COMMUNITY)
Admission: RE | Admit: 2014-02-06 | Discharge: 2014-02-06 | Disposition: A | Discharge status: Home / Self Care | Payer: Private Health Insurance - Indemnity | Source: Ambulatory Visit | Attending: Sports Medicine | Admitting: Sports Medicine

## 2014-02-06 DIAGNOSIS — M25519 Pain in unspecified shoulder: Secondary | ICD-10-CM | POA: Diagnosis present

## 2014-02-08 ENCOUNTER — Encounter: Payer: Self-pay | Admitting: Emergency Medicine

## 2014-02-08 ENCOUNTER — Emergency Department
Admission: EM | Admit: 2014-02-08 | Discharge: 2014-02-08 | Disposition: A | Discharge status: Home / Self Care | Payer: Private Health Insurance - Indemnity | Source: Home / Self Care | Attending: Family Medicine | Admitting: Family Medicine

## 2014-02-08 DIAGNOSIS — S61219A Laceration without foreign body of unspecified finger without damage to nail, initial encounter: Secondary | ICD-10-CM

## 2014-02-08 DIAGNOSIS — S61209A Unspecified open wound of unspecified finger without damage to nail, initial encounter: Secondary | ICD-10-CM

## 2014-02-08 NOTE — ED Notes (Signed)
Stuck steak knife into distal soft pad of L index finger.  Done this AM.  Frederick Yang re last Tetanus

## 2014-02-08 NOTE — Discharge Instructions (Signed)
Keep wound clean and dry.  Wear splint.  Return for any signs of infection.  Follow instructions on Dermabond information sheet.   Laceration Care, Adult A laceration is a cut or lesion that goes through all layers of the skin and into the tissue just beneath the skin. TREATMENT  Some lacerations may not require closure. Some lacerations may not be able to be closed due to an increased risk of infection. It is important to see your caregiver as soon as possible after an injury to minimize the risk of infection and maximize the opportunity for successful closure. If closure is appropriate, pain medicines may be given, if needed. The wound will be cleaned to help prevent infection. Your caregiver will use stitches (sutures), staples, wound glue (adhesive), or skin adhesive strips to repair the laceration. These tools bring the skin edges together to allow for faster healing and a better cosmetic outcome. However, all wounds will heal with a scar. Once the wound has healed, scarring can be minimized by covering the wound with sunscreen during the day for 1 full year. HOME CARE INSTRUCTIONS  For sutures or staples:  Keep the wound clean and dry.  If you were given a bandage (dressing), you should change it at least once a day. Also, change the dressing if it becomes wet or dirty, or as directed by your caregiver.  Wash the wound with soap and water 2 times a day. Rinse the wound off with water to remove all soap. Pat the wound dry with a clean towel.  After cleaning, apply a thin layer of the antibiotic ointment as recommended by your caregiver. This will help prevent infection and keep the dressing from sticking.  You may shower as usual after the first 24 hours. Do not soak the wound in water until the sutures are removed.  Only take over-the-counter or prescription medicines for pain, discomfort, or fever as directed by your caregiver.  Get your sutures or staples removed as directed by your  caregiver. For skin adhesive strips:  Keep the wound clean and dry.  Do not get the skin adhesive strips wet. You may bathe carefully, using caution to keep the wound dry.  If the wound gets wet, pat it dry with a clean towel.  Skin adhesive strips will fall off on their own. You may trim the strips as the wound heals. Do not remove skin adhesive strips that are still stuck to the wound. They will fall off in time. For wound adhesive:  You may briefly wet your wound in the shower or bath. Do not soak or scrub the wound. Do not swim. Avoid periods of heavy perspiration until the skin adhesive has fallen off on its own. After showering or bathing, gently pat the wound dry with a clean towel.  Do not apply liquid medicine, cream medicine, or ointment medicine to your wound while the skin adhesive is in place. This may loosen the film before your wound is healed.  If a dressing is placed over the wound, be careful not to apply tape directly over the skin adhesive. This may cause the adhesive to be pulled off before the wound is healed.  Avoid prolonged exposure to sunlight or tanning lamps while the skin adhesive is in place. Exposure to ultraviolet light in the first year will darken the scar.  The skin adhesive will usually remain in place for 5 to 10 days, then naturally fall off the skin. Do not pick at the adhesive film. You  may need a tetanus shot if:  You cannot remember when you had your last tetanus shot.  You have never had a tetanus shot. If you get a tetanus shot, your arm may swell, get red, and feel warm to the touch. This is common and not a problem. If you need a tetanus shot and you choose not to have one, there is a rare chance of getting tetanus. Sickness from tetanus can be serious. SEEK MEDICAL CARE IF:   You have redness, swelling, or increasing pain in the wound.  You see a red line that goes away from the wound.  You have yellowish-white fluid (pus) coming from  the wound.  You have a fever.  You notice a bad smell coming from the wound or dressing.  Your wound breaks open before or after sutures have been removed.  You notice something coming out of the wound such as wood or glass.  Your wound is on your hand or foot and you cannot move a finger or toe. SEEK IMMEDIATE MEDICAL CARE IF:   Your pain is not controlled with prescribed medicine.  You have severe swelling around the wound causing pain and numbness or a change in color in your arm, hand, leg, or foot.  Your wound splits open and starts bleeding.  You have worsening numbness, weakness, or loss of function of any joint around or beyond the wound.  You develop painful lumps near the wound or on the skin anywhere on your body. MAKE SURE YOU:   Understand these instructions.  Will watch your condition.  Will get help right away if you are not doing well or get worse. Document Released: 06/13/2005 Document Revised: 09/05/2011 Document Reviewed: 12/07/2010 [facility] Patient Information 2015 [place], [place]. This information is not intended to replace advice given to you by your health care provider. Make sure you discuss any questions you have with your health care provider.

## 2014-02-08 NOTE — ED Provider Notes (Signed)
CSN: [id number]     Arrival date & time 02/08/14  2536 History   First MD Initiated Contact with Patient 02/08/14 0957     Chief Complaint  Patient presents with  . Extremity Laceration    L index finger      HPI Comments: Patient cut his left distal index finger with a steak knife last night (stab type wound).  His last Tdap was 04/19/2010.  Patient is a 53 y.o. male presenting with skin laceration. The history is provided by the patient.  Laceration Location:  Finger Finger laceration location:  L index finger Length (cm):  0.6 Depth:  Through underlying tissue Quality: straight   Bleeding: controlled   Time since incident:  12 hours Laceration mechanism:  Knife Pain details:    Quality:  Dull   Severity:  Mild   Timing:  Constant   Progression:  Improving Foreign body present:  No foreign bodies Relieved by:  Pressure Ineffective treatments:  Pressure Tetanus status:  Up to date   Past Medical History  Diagnosis Date  . Prostate hypertrophy   . Hyperlipidemia   . GERD (gastroesophageal reflux disease)    Past Surgical History  Procedure Laterality Date  . Hernia repair      RT groin  . Appendectomy    . Treatment fistula anal    . Hemorrhoid banding  06/24/13    Dr. Ludwig Lean  . Hemorrhoid banding  07/12/2013   Family History  Problem Relation Age of Onset  . Breast cancer Mother 62  . Alcohol abuse Maternal Uncle   . Heart attack Maternal Uncle   . Stroke Maternal Uncle   . Lung cancer Maternal Grandfather   . Diabetes Maternal Aunt   . Hypertension Brother   . Hypertension Sister   . Diabetes Sister   . Uterine cancer Mother    History  Substance Use Topics  . Smoking status: Former Smoker    Types: Cigarettes    Quit date: 06/27/1988  . Smokeless tobacco: Never Used  . Alcohol Use: Yes    Review of Systems  All other systems reviewed and are negative.   Allergies  Review of patient's allergies indicates no known allergies.  Home  Medications   Prior to Admission medications   Medication Sig Start Date End Date Taking? Authorizing Provider  APPLE CIDER VINEGAR PO Take 30 mLs by mouth daily.    Historical Provider, MD  Calcium-Magnesium-Vitamin D (CALCIUM 500 PO) Take 500 mg by mouth 2 (two) times daily.    Historical Provider, MD  Cinnamon 500 MG capsule Take 2,000 mg by mouth daily.    Historical Provider, MD  diazepam (VALIUM) 10 MG tablet Take 1 tab PO 2 hours before procedure or imaging. 01/28/14   Silverio Decamp, MD  FIBER PO Take 9 g by mouth 2 (two) times daily.    Historical Provider, MD  fish oil-omega-3 fatty acids 1000 MG capsule Take 3,000 g by mouth daily.    Historical Provider, MD  Garlic 6440 MG CAPS Take 1,000 mg by mouth.    Historical Provider, MD  glucosamine-chondroitin 500-400 MG tablet Take 1 tablet by mouth 3 (three) times daily.    Historical Provider, MD  HONEY PO Take 30 mLs by mouth daily.    Historical Provider, MD  Magnesium 500 MG CAPS Take 500 mg by mouth.    Historical Provider, MD  Multiple Vitamins-Minerals (MULTIVITAMIN WITH MINERALS) tablet Take 1 tablet by mouth daily.    Historical  Provider, MD  niacin 500 MG tablet Take 500 mg by mouth daily with breakfast.    Historical Provider, MD  nitroGLYCERIN (NITRODUR - DOSED IN MG/24 HR) 0.2 mg/hr patch Cut and apply 1/4 patch to most painful area q24h. 04/15/13   Silverio Decamp, MD  traMADol (ULTRAM) 50 MG tablet 1-2 tabs by mouth Q8 hours, maximum 6 tabs per day. 12/30/13   Silverio Decamp, MD   BP 114/77  Pulse 83  Temp(Src) 98.1 F (36.7 C) (Oral)  Ht 5\' 7"  (1.702 m)  Wt 297 lb 8 oz (134.945 kg)  BMI 46.58 kg/m2  SpO2 98% Physical Exam  Nursing note and vitals reviewed. Constitutional: He is oriented to person, place, and time. He appears well-developed and well-nourished. No distress.  Patient is obese (BMI 36.6)  HENT:  Head: Atraumatic.  Eyes: Conjunctivae are normal. Pupils are equal, round, and reactive  to light.  Musculoskeletal:       Left hand: He exhibits laceration. He exhibits normal range of motion, no tenderness, no bony tenderness, normal two-point discrimination, normal capillary refill and no swelling. Normal sensation noted.       Hands: On the palmar surface of the left index finger is a simple linear 49mm long laceration as noted on diagram.    Neurological: He is alert and oriented to person, place, and time.  Skin: Skin is warm and dry.    ED Course  Procedures  Laceration Repair (Dermabond) Discussed benefits and risks of procedure and verbal consent obtained. Using sterile technique, cleansed wound with Betadine followed by copious lavage with normal saline.  Wound carefully inspected for debris and foreign bodies; none found.  Wound edges carefully approximated in normal anatomic position and closed with Dermabond.  Wound precautions explained to patient.             MDM   1. Laceration of finger of left hand, initial encounter     Keep wound clean and dry.  Wear splint.  Return for any signs of infection.  Follow instructions on Dermabond information sheet.    Kandra Nicolas, MD 02/09/14 [phone]

## 2014-02-18 ENCOUNTER — Ambulatory Visit (INDEPENDENT_AMBULATORY_CARE_PROVIDER_SITE_OTHER): Payer: Private Health Insurance - Indemnity | Admitting: Sports Medicine

## 2014-02-18 ENCOUNTER — Encounter: Payer: Self-pay | Admitting: Sports Medicine

## 2014-02-18 VITALS — BP 127/74 | HR 90 | Ht 67.0 in | Wt 300.0 lb

## 2014-02-18 DIAGNOSIS — M7541 Impingement syndrome of right shoulder: Secondary | ICD-10-CM

## 2014-02-18 DIAGNOSIS — M25819 Other specified joint disorders, unspecified shoulder: Secondary | ICD-10-CM

## 2014-02-18 DIAGNOSIS — M7580 Other shoulder lesions, unspecified shoulder: Secondary | ICD-10-CM

## 2014-02-18 MED ORDER — DICLOFENAC SODIUM 75 MG PO TBEC: 75.0000 mg | DELAYED_RELEASE_TABLET | Freq: Two times a day (BID) | ORAL | Status: DC

## 2014-02-18 MED ORDER — TRAMADOL HCL 50 MG PO TABS: ORAL_TABLET | ORAL | Status: DC

## 2014-02-18 NOTE — Progress Notes (Signed)
  Subjective:    CC: Followup MRI  HPI: Right shoulder pain: Clinically represents impingement syndrome, Frederick Yang has had therapy, several subacromial injections under guidance and a.c. and glenohumeral joint injections under guidance. Unfortunately pain is persistent we obtained an MRI. He is here to go over the results.  Past medical history, Surgical history, Family history not pertinant except as noted below, Social history, Allergies, and medications have been entered into the medical record, reviewed, and no changes needed.   Review of Systems: No fevers, chills, night sweats, weight loss, chest pain, or shortness of breath.   Objective:    General: Well Developed, well nourished, and in no acute distress.  Neuro: Alert and oriented x3, extra-ocular muscles intact, sensation grossly intact.  HEENT: Normocephalic, atraumatic, pupils equal round reactive to light, neck supple, no masses, no lymphadenopathy, thyroid nonpalpable.  Skin: Warm and dry, no rashes. Cardiac: Regular rate and rhythm, no murmurs rubs or gallops, no lower extremity edema.  Respiratory: Clear to auscultation bilaterally. Not using accessory muscles, speaking in full sentences.  MRI shows tendinosis with a small insertional tear of the supraspinatus and tendinosis of the infraspinatus. He has acromioclavicular DJD.  Impression and Recommendations:

## 2014-02-18 NOTE — Assessment & Plan Note (Signed)
Post several subacromial ultrasound-guided injections and an acromioclavicular and glenohumeral injection. Persistent symptoms, doubling tramadol, switching to diclofenac, referral to Dr. Tamera Punt for shoulder arthroscopy.

## 2014-03-06 HISTORY — PX: OTHER SURGICAL HISTORY: SHX169

## 2014-03-27 ENCOUNTER — Ambulatory Visit: Payer: Private Health Insurance - Indemnity | Admitting: Family Medicine

## 2014-04-18 ENCOUNTER — Encounter: Payer: Self-pay | Admitting: Family Medicine

## 2014-04-18 ENCOUNTER — Ambulatory Visit (INDEPENDENT_AMBULATORY_CARE_PROVIDER_SITE_OTHER): Payer: Private Health Insurance - Indemnity | Admitting: Family Medicine

## 2014-04-18 VITALS — BP 116/69 | HR 76 | Ht 67.0 in | Wt 299.0 lb

## 2014-04-18 DIAGNOSIS — Z0000 Encounter for general adult medical examination without abnormal findings: Secondary | ICD-10-CM

## 2014-04-18 DIAGNOSIS — E785 Hyperlipidemia, unspecified: Secondary | ICD-10-CM

## 2014-04-18 DIAGNOSIS — Z125 Encounter for screening for malignant neoplasm of prostate: Secondary | ICD-10-CM

## 2014-04-18 DIAGNOSIS — R7309 Other abnormal glucose: Secondary | ICD-10-CM

## 2014-04-18 DIAGNOSIS — Z0189 Encounter for other specified special examinations: Secondary | ICD-10-CM

## 2014-04-18 LAB — COMPLETE METABOLIC PANEL WITH GFR
ALK PHOS: 68 U/L (ref 39–117)
ALT: 27 U/L (ref 0–53)
AST: 18 U/L (ref 0–37)
Albumin: 4.1 g/dL (ref 3.5–5.2)
BILIRUBIN TOTAL: 0.5 mg/dL (ref 0.2–1.2)
BUN: 10 mg/dL (ref 6–23)
CO2: 26 mEq/L (ref 19–32)
CREATININE: 0.74 mg/dL (ref 0.50–1.35)
Calcium: 9.4 mg/dL (ref 8.4–10.5)
Chloride: 103 mEq/L (ref 96–112)
GFR, Est Non African American: >89 mL/min
Glucose, Bld: 107 mg/dL — ABNORMAL HIGH (ref 70–99)
Potassium: 5 mEq/L (ref 3.5–5.3)
Sodium: 138 mEq/L (ref 135–145)
Total Protein: 7.3 g/dL (ref 6.0–8.3)

## 2014-04-18 LAB — LIPID PANEL
Cholesterol: 250 mg/dL — ABNORMAL HIGH (ref 0–200)
HDL: 41 mg/dL (ref >39)
LDL Cholesterol: 147 mg/dL — ABNORMAL HIGH (ref 0–99)
TRIGLYCERIDES: 308 mg/dL — AB (ref <150)
Total CHOL/HDL Ratio: 6.1 Ratio
VLDL: 62 mg/dL — ABNORMAL HIGH (ref 0–40)

## 2014-04-18 LAB — POCT GLYCOSYLATED HEMOGLOBIN (HGB A1C): Hemoglobin A1C: 6.2

## 2014-04-18 NOTE — Patient Instructions (Signed)
Keep up a regular exercise program and make sure you are eating a healthy diet Try to eat 4 servings of dairy a day, or if you are lactose intolerant take a calcium with vitamin D daily.  Your vaccines are up to date.   

## 2014-04-18 NOTE — Addendum Note (Signed)
Addended by: Teddy Spike on: 04/18/2014 09:04 AM   Modules accepted: Orders

## 2014-04-18 NOTE — Assessment & Plan Note (Signed)
2015: 10 yr risk of 4.9%, lifetime risk of 50%

## 2014-04-18 NOTE — Progress Notes (Addendum)
Subjective:    Patient ID: Frederick Yang, male    DOB: 08-Oct-1960, 53 y.o.   MRN: [id number]  HPI Here for CPE. He did want me to look at his right great toe today. The medial border has been sore and irritated. He had a pedicure back in July and says they cut along the nail border. It was very sore for about 2 days and then just irritated for a while. He says it's been gradually getting a little bit better. No active drainage or pus. He is able to wear normal shoes.   Review of Systems Comprehensive ROS  There were no vitals taken for this visit.    No Known Allergies  Past Medical History  Diagnosis Date  . Prostate hypertrophy   . Hyperlipidemia   . GERD (gastroesophageal reflux disease)     Past Surgical History  Procedure Laterality Date  . Hernia repair      RT groin  . Appendectomy    . Treatment fistula anal    . Hemorrhoid banding  06/24/13    Dr. Ludwig Lean  . Hemorrhoid banding  07/12/2013    History   Social History  . Marital Status: Married    Spouse Name: N/A    Number of Children: 3   . Years of Education: N/A   Occupational History  .  [employer]   Social History Main Topics  . Smoking status: Former Smoker    Types: Cigarettes    Quit date: 06/27/1988  . Smokeless tobacco: Never Used  . Alcohol Use: Yes  . Drug Use: No  . Sexual Activity: Not on file   Other Topics Concern  . Not on file   Social History Narrative   Started some exercise. Occ caffeine but not daily.     Family History  Problem Relation Age of Onset  . Breast cancer Mother 3  . Alcohol abuse Maternal Uncle   . Heart attack Maternal Uncle   . Stroke Maternal Uncle   . Lung cancer Maternal Grandfather   . Diabetes Maternal Aunt   . Hypertension Brother   . Hypertension Sister   . Diabetes Sister   . Uterine cancer Mother     Outpatient Encounter Prescriptions as of 04/18/2014  Medication Sig  . APPLE CIDER VINEGAR PO Take 30 mLs by mouth daily.  .  Calcium-Magnesium-Vitamin D (CALCIUM 500 PO) Take 500 mg by mouth 2 (two) times daily.  . Cinnamon 500 MG capsule Take 2,000 mg by mouth daily.  . diclofenac (VOLTAREN) 75 MG EC tablet Take 1 tablet (75 mg total) by mouth 2 (two) times daily.  Marland Kitchen FIBER PO Take 9 g by mouth 2 (two) times daily.  . fish oil-omega-3 fatty acids 1000 MG capsule Take 3,000 g by mouth daily.  . Garlic 0865 MG CAPS Take 1,000 mg by mouth.  Marland Kitchen glucosamine-chondroitin 500-400 MG tablet Take 1 tablet by mouth 3 (three) times daily.  . HONEY PO Take 30 mLs by mouth daily.  . Magnesium 500 MG CAPS Take 500 mg by mouth.  . Multiple Vitamins-Minerals (MULTIVITAMIN WITH MINERALS) tablet Take 1 tablet by mouth daily.  . niacin 500 MG tablet Take 500 mg by mouth daily with breakfast.  . nitroGLYCERIN (NITRODUR - DOSED IN MG/24 HR) 0.2 mg/hr patch Cut and apply 1/4 patch to most painful area q24h.  . traMADol (ULTRAM) 50 MG tablet 2 tabs by mouth Q8 hours, maximum 6 tabs per day.  Objective:   Physical Exam  Constitutional: He is oriented to person, place, and time. He appears well-developed and well-nourished.  HENT:  Head: Normocephalic and atraumatic.  Right Ear: External ear normal.  Left Ear: External ear normal.  Nose: Nose normal.  Mouth/Throat: Oropharynx is clear and moist.  Eyes: Conjunctivae and EOM are normal. Pupils are equal, round, and reactive to light.  Neck: Normal range of motion. Neck supple. No thyromegaly present.  Cardiovascular: Normal rate, regular rhythm, normal heart sounds and intact distal pulses.   Pulmonary/Chest: Effort normal and breath sounds normal.  Abdominal: Soft. Bowel sounds are normal. He exhibits no distension and no mass. There is no tenderness. There is no rebound and no guarding.  Musculoskeletal: Normal range of motion.  Lymphadenopathy:    He has no cervical adenopathy.  Neurological: He is alert and oriented to person, place, and time. He has normal reflexes.   Skin: Skin is warm and dry.  Acanthosis nigricans around the neck  Psychiatric: He has a normal mood and affect. His behavior is normal. Judgment and thought content normal.          Assessment & Plan:  CPE Keep up a regular exercise program and make sure you are eating a healthy diet Try to eat 4 servings of dairy a day, or if you are lactose intolerant take a calcium with vitamin D daily.  Your vaccines are up to date.   I think he may have a mild ingrown toenail. Doesn't look acutely inflamed and if not sore on touch today. He says it's action much better than it was a month ago. Continue current regimen. If he suddenly gets worse then please let me know and we can do a partial nail removal.  Skin tags-we discussed possible removal. He will check with his insurance and check on coverage and let me know.

## 2014-04-19 LAB — PSA: PSA: 2.2 ng/mL (ref <=4.00)

## 2014-06-26 ENCOUNTER — Ambulatory Visit (INDEPENDENT_AMBULATORY_CARE_PROVIDER_SITE_OTHER): Payer: Private Health Insurance - Indemnity | Admitting: Sports Medicine

## 2014-06-26 ENCOUNTER — Encounter: Payer: Self-pay | Admitting: Sports Medicine

## 2014-06-26 VITALS — BP 131/80 | HR 84 | Ht 67.0 in | Wt 307.0 lb

## 2014-06-26 DIAGNOSIS — G5602 Carpal tunnel syndrome, left upper limb: Secondary | ICD-10-CM

## 2014-06-26 DIAGNOSIS — M722 Plantar fascial fibromatosis: Secondary | ICD-10-CM | POA: Insufficient documentation

## 2014-06-26 NOTE — Progress Notes (Signed)
  Subjective:    CC: Carpal tunnel syndrome  HPI: This pleasant 53 year old male returns, his most recent median nerve hydrodissection provided 8 months of relief. He is now having a recurrence of pain in the median nerve distribution and desires a repeat treatment. He does tell that he has discontinued use of his nighttime splint.  Plantar fasciitis: Wonders what can be done, amenable to try conservative treatment initially. Pain is moderate, persistent and localized on the right worse than left heel. Worse with the first few steps in the morning.  Past medical history, Surgical history, Family history not pertinant except as noted below, Social history, Allergies, and medications have been entered into the medical record, reviewed, and no changes needed.   Review of Systems: No fevers, chills, night sweats, weight loss, chest pain, or shortness of breath.   Objective:    General: Well Developed, well nourished, and in no acute distress.  Neuro: Alert and oriented x3, extra-ocular muscles intact, sensation grossly intact.  HEENT: Normocephalic, atraumatic, pupils equal round reactive to light, neck supple, no masses, no lymphadenopathy, thyroid nonpalpable.  Skin: Warm and dry, no rashes. Cardiac: Regular rate and rhythm, no murmurs rubs or gallops, no lower extremity edema.  Respiratory: Clear to auscultation bilaterally. Not using accessory muscles, speaking in full sentences.  Procedure: Real-time Ultrasound Guided Injection of left carpal tunnel median nerve hydrodissection Device: GE Logiq E  Verbal informed consent obtained.  Time-out conducted.  Noted no overlying erythema, induration, or other signs of local infection.  Skin prepped in a sterile fashion.  Local anesthesia: Topical Ethyl chloride.  With sterile technique and under real time ultrasound guidance:  Using a 25-gauge needle I injected a total of 1 mL kenalog 40, 4 mL lidocaine both superficial to and deep to the  median nerve in the carpal tunnel, freeing it from surrounding structures. Completed without difficulty  Pain immediately resolved suggesting accurate placement of the medication.  Advised to call if fevers/chills, erythema, induration, drainage, or persistent bleeding.  Images permanently stored and available for review in the ultrasound unit.  Impression: Technically successful ultrasound guided injection.  Impression and Recommendations:

## 2014-06-26 NOTE — Assessment & Plan Note (Signed)
Most recent median nerve hydrodissection provided 8 months of response. Repeat median nerve hydrodissection today. He has not been wearing his nighttime splinting, encouraged restarting this. Return as needed.

## 2014-06-26 NOTE — Assessment & Plan Note (Signed)
Rehabilitation exercises given. Return for custom orthotics.

## 2014-07-29 ENCOUNTER — Ambulatory Visit (INDEPENDENT_AMBULATORY_CARE_PROVIDER_SITE_OTHER): Payer: Private Health Insurance - Indemnity | Admitting: Sports Medicine

## 2014-07-29 ENCOUNTER — Encounter: Payer: Self-pay | Admitting: Sports Medicine

## 2014-07-29 VITALS — BP 133/74 | HR 94 | Ht 67.0 in | Wt 300.0 lb

## 2014-07-29 DIAGNOSIS — M722 Plantar fascial fibromatosis: Secondary | ICD-10-CM

## 2014-07-29 DIAGNOSIS — M7541 Impingement syndrome of right shoulder: Secondary | ICD-10-CM

## 2014-07-29 NOTE — Assessment & Plan Note (Signed)
Custom orthotics as above. 

## 2014-07-29 NOTE — Assessment & Plan Note (Signed)
Overall doing well but is now post subacromial decompression. Still having mild painful clicking, he is a candidate for another injection postoperative, he will do rotator cuff exercises for 2 weeks and if no better we can do an injection.

## 2014-07-29 NOTE — Progress Notes (Signed)

## 2014-11-03 ENCOUNTER — Ambulatory Visit (INDEPENDENT_AMBULATORY_CARE_PROVIDER_SITE_OTHER): Payer: Private Health Insurance - Indemnity | Admitting: Sports Medicine

## 2014-11-03 ENCOUNTER — Encounter: Payer: Self-pay | Admitting: Sports Medicine

## 2014-11-03 VITALS — BP 121/72 | HR 90 | Ht 67.0 in | Wt 303.0 lb

## 2014-11-03 DIAGNOSIS — M19072 Primary osteoarthritis, left ankle and foot: Secondary | ICD-10-CM | POA: Diagnosis not present

## 2014-11-03 NOTE — Assessment & Plan Note (Signed)
There is an overlying benign, asymptomatic lipoma. He also has fairly severe widespread midfoot osteoarthritis. Talonavicular injection as above. Return in one month.

## 2014-11-03 NOTE — Progress Notes (Signed)
  Subjective:    CC: follow-up  HPI: Frederick Yang comes back, he has a history of plantar fasciitis, more recently he has had some pain that he localizes over the dorsum of the midfoot, moderate, persistent without radiation, stiff in the morning and worse with weightbearing. No injuries, no trauma.  Past medical history, Surgical history, Family history not pertinant except as noted below, Social history, Allergies, and medications have been entered into the medical record, reviewed, and no changes needed.   Review of Systems: No fevers, chills, night sweats, weight loss, chest pain, or shortness of breath.   Objective:    General: Well Developed, well nourished, and in no acute distress.  Neuro: Alert and oriented x3, extra-ocular muscles intact, sensation grossly intact.  HEENT: Normocephalic, atraumatic, pupils equal round reactive to light, neck supple, no masses, no lymphadenopathy, thyroid nonpalpable.  Skin: Warm and dry, no rashes. Cardiac: Regular rate and rhythm, no murmurs rubs or gallops, no lower extremity edema.  Respiratory: Clear to auscultation bilaterally. Not using accessory muscles, speaking in full sentences. Left foot: There is a palpable 3-4 cm well-defined, movable mass over the medial malleolus that feels like a lipoma. There is also tenderness diffusely over the midfoot.no tenderness over the medial or lateral tendons, nor the dorsal tendons. Strength is good.  Procedure: Real-time Ultrasound Guided Injection of left dorsal mid foot, talonavicular joint Device: GE Logiq E  Verbal informed consent obtained.  Time-out conducted.  Noted no overlying erythema, induration, or other signs of local infection.  Skin prepped in a sterile fashion.  Local anesthesia: Topical Ethyl chloride.  With sterile technique and under real time ultrasound guidance:  25-gauge needle advanced into the talonavicular joint, 1 mL kenalog 40, 1 mL lidocaine injected easily. Completed without  difficulty  Pain immediately resolved suggesting accurate placement of the medication.  Advised to call if fevers/chills, erythema, induration, drainage, or persistent bleeding.  Images permanently stored and available for review in the ultrasound unit.  Impression: Technically successful ultrasound guided injection.  Impression and Recommendations:

## 2014-12-01 ENCOUNTER — Ambulatory Visit (INDEPENDENT_AMBULATORY_CARE_PROVIDER_SITE_OTHER): Payer: Private Health Insurance - Indemnity | Admitting: Sports Medicine

## 2014-12-01 ENCOUNTER — Encounter: Payer: Self-pay | Admitting: Sports Medicine

## 2014-12-01 VITALS — BP 114/70 | HR 87 | Ht 67.0 in | Wt 304.0 lb

## 2014-12-01 DIAGNOSIS — M19072 Primary osteoarthritis, left ankle and foot: Secondary | ICD-10-CM | POA: Diagnosis not present

## 2014-12-01 MED ORDER — DICLOFENAC SODIUM 2 % TD SOLN: 2.0000 | Freq: Two times a day (BID) | TRANSDERMAL | Status: DC

## 2014-12-01 NOTE — Assessment & Plan Note (Signed)
Talonavicular midfoot osteoarthritis with pain completely resolved after injection at the last visit. I think the pain over the dorsum may have to do with a nick in the extensor digitorum tendons as he jerked his foot during the injection, we will treat this with topical diclofenac, and he can return to see me if no better in about a month. At that point we would consider a diagnostic/therapeutic injection, +/- MRI.

## 2014-12-01 NOTE — Progress Notes (Signed)
  Subjective:    CC: Follow-up  HPI: Frederick Yang had a left talonavicular joint injection at the last visit, he did jerk his foot during the injection, resulting in the needle coming out of the skin, we had to reinsert the needle to complete the injection procedure. He is completely pain free with regards to his talonavicular joint but still has some pain that he localizes over the extensor digitorum tendons at the level of the talocrural joint. Overall significantly better than prior.  Past medical history, Surgical history, Family history not pertinant except as noted below, Social history, Allergies, and medications have been entered into the medical record, reviewed, and no changes needed.   Review of Systems: No fevers, chills, night sweats, weight loss, chest pain, or shortness of breath.   Objective:    General: Well Developed, well nourished, and in no acute distress.  Neuro: Alert and oriented x3, extra-ocular muscles intact, sensation grossly intact.  HEENT: Normocephalic, atraumatic, pupils equal round reactive to light, neck supple, no masses, no lymphadenopathy, thyroid nonpalpable.  Skin: Warm and dry, no rashes. Cardiac: Regular rate and rhythm, no murmurs rubs or gallops, no lower extremity edema.  Respiratory: Clear to auscultation bilaterally. Not using accessory muscles, speaking in full sentences. Left Foot: No visible erythema or swelling. Range of motion is full in all directions. Strength is 5/5 in all directions. No hallux valgus. No pes cavus or pes planus. No abnormal callus noted. No pain over the navicular prominence, or base of fifth metatarsal. No tenderness to palpation of the calcaneal insertion of plantar fascia. No pain at the Achilles insertion. No pain over the calcaneal bursa. No pain of the retrocalcaneal bursa. No tenderness to palpation over the tarsals, metatarsals, or phalanges. No hallux rigidus or limitus. Minimal tenderness over the extensor  digitorum longus tendon sheath at the level of the talocrural joint, no longer has any tenderness at the talonavicular joint. No tenderness palpation over interphalangeal joints. No pain with compression of the metatarsal heads. Neurovascularly intact distally.  Impression and Recommendations:

## 2015-02-23 ENCOUNTER — Ambulatory Visit (INDEPENDENT_AMBULATORY_CARE_PROVIDER_SITE_OTHER): Payer: Private Health Insurance - Indemnity | Admitting: Family Medicine

## 2015-02-23 ENCOUNTER — Encounter: Payer: Self-pay | Admitting: Family Medicine

## 2015-02-23 ENCOUNTER — Ambulatory Visit: Payer: Private Health Insurance - Indemnity | Admitting: Family Medicine

## 2015-02-23 VITALS — BP 124/74 | HR 88 | Temp 98.8°F | Wt 302.0 lb

## 2015-02-23 DIAGNOSIS — K625 Hemorrhage of anus and rectum: Secondary | ICD-10-CM | POA: Insufficient documentation

## 2015-02-23 NOTE — Progress Notes (Signed)
Frederick Yang is a 54 y.o. male who presents to [facility]  today for rectal bleeding. Patient has a several month history of intermittent rectal bleeding. He's had similar episodes in the past that resolved after banding of internal hemorrhoids. He's had a colonoscopy which showed multiple polyps and he has a three-year follow-up planned in 2017. He denies any pain fevers chills nausea vomiting or diarrhea. He feels well otherwise with no treatment tried yet. No unintentional weight loss.  Patient notes that the blood is bright red and visible on the toilet paper.   Past Medical History  Diagnosis Date  . Prostate hypertrophy   . Hyperlipidemia   . GERD (gastroesophageal reflux disease)    Past Surgical History  Procedure Laterality Date  . Hernia repair      RT groin  . Appendectomy    . Treatment fistula anal    . Hemorrhoid banding  06/24/13    Dr. Ludwig Lean  . Hemorrhoid banding  07/12/2013  . Right shoulder surgery  03/06/2014    Dr. Tamera Punt    Social History  Substance Use Topics  . Smoking status: Former Smoker    Types: Cigarettes    Quit date: 06/27/1988  . Smokeless tobacco: Never Used  . Alcohol Use: Yes   family history includes Alcohol abuse in his maternal uncle; Breast cancer (age of onset: 55) in his mother; Diabetes in his maternal aunt and sister; Heart attack in his maternal uncle; Hypertension in his brother and sister; Lung cancer in his maternal grandfather; Stroke in his maternal uncle; Uterine cancer in his mother.  ROS as above Medications: Current Outpatient Prescriptions  Medication Sig Dispense Refill  . APPLE CIDER VINEGAR PO Take 30 mLs by mouth daily.    . Calcium-Magnesium-Vitamin D (CALCIUM 500 PO) Take 500 mg by mouth 2 (two) times daily.    . Cinnamon 500 MG capsule Take 2,000 mg by mouth daily.    . Diclofenac Sodium 2 % SOLN Place 2 sprays onto the skin 2 (two) times daily. 1 Bottle 11  . FIBER PO  Take 9 g by mouth 2 (two) times daily.    . fish oil-omega-3 fatty acids 1000 MG capsule Take 3,000 g by mouth daily.    . Garlic 7989 MG CAPS Take 1,000 mg by mouth.    Marland Kitchen glucosamine-chondroitin 500-400 MG tablet Take 1 tablet by mouth 3 (three) times daily.    . HONEY PO Take 30 mLs by mouth daily.    . Magnesium 500 MG CAPS Take 500 mg by mouth.    . Multiple Vitamins-Minerals (MULTIVITAMIN WITH MINERALS) tablet Take 1 tablet by mouth daily.    . niacin 500 MG tablet Take 500 mg by mouth daily with breakfast.     No current facility-administered medications for this visit.   No Known Allergies   Exam:  BP 124/74 mmHg  Pulse 88  Temp(Src) 98.8 F (37.1 C) (Oral)  Wt 302 lb (136.986 kg) Gen: Well NAD, morbidly obese  HEENT: EOMI,  MMM Lungs: Normal work of breathing. CTABL Heart: RRR no MRG Abd: NABS, Soft. Nondistended, Nontender Exts: Brisk capillary refill, warm and well perfused.  Rectal exam: Normal-appearing anus. Rectal exam with no masses palpated. Nontender. Normal rectal tone. Stool in the vault.  No results found for this or any previous visit (from the past 24 hour(s)). No results found.   Please see individual assessment and plan sections.

## 2015-02-23 NOTE — Patient Instructions (Signed)
Thank you for coming in today. If your belly pain worsens, or you have high fever, bad vomiting, blood in your stool or black tarry stool go to the Emergency Room.  Follow up with gastroenterology. Return to Dr. Charise Carwin as needed  Rectal Bleeding Rectal bleeding is when blood passes out of the anus. It is usually a sign that something is wrong. It may not be serious, but it should always be evaluated. Rectal bleeding may present as bright red blood or extremely dark stools. The color may range from dark red or maroon to black (like tar). It is important that the cause of rectal bleeding be identified so treatment can be started and the problem corrected. CAUSES   Hemorrhoids. These are enlarged (dilated) blood vessels or veins in the anal or rectal area.  Fistulas. Theseare abnormal, burrowing channels that usually run from inside the rectum to the skin around the anus. They can bleed.  Anal fissures. This is a tear in the tissue of the anus. Bleeding occurs with bowel movements.  Diverticulosis. This is a condition in which pockets or sacs project from the bowel wall. Occasionally, the sacs can bleed.  Diverticulitis. Thisis an infection involving diverticulosis of the colon.  Proctitis and colitis. These are conditions in which the rectum, colon, or both, can become inflamed and pitted (ulcerated).  Polyps and cancer. Polyps are non-cancerous (benign) growths in the colon that may bleed. Certain types of polyps turn into cancer.  Protrusion of the rectum. Part of the rectum can project from the anus and bleed.  Certain medicines.  Intestinal infections.  Blood vessel abnormalities. HOME CARE INSTRUCTIONS  Eat a high-fiber diet to keep your stool soft.  Limit activity.  Drink enough fluids to keep your urine clear or pale yellow.  Warm baths may be useful to soothe rectal pain.  Follow up with your caregiver as directed. SEEK IMMEDIATE MEDICAL CARE IF:  You develop  increased bleeding.  You have black or dark red stools.  You vomit blood or material that looks like coffee grounds.  You have abdominal pain or tenderness.  You have a fever.  You feel weak, nauseous, or you faint.  You have severe rectal pain or you are unable to have a bowel movement. MAKE SURE YOU:  Understand these instructions.  Will watch your condition.  Will get help right away if you are not doing well or get worse. Document Released: 12/03/2001 Document Revised: 09/05/2011 Document Reviewed: 11/28/2010 [facility] Patient Information 2015 [place], [place]. This information is not intended to replace advice given to you by your health care provider. Make sure you discuss any questions you have with your health care provider.

## 2015-02-23 NOTE — Assessment & Plan Note (Signed)
Patient with bright red rectal bleeding. This is likely from a distal source such as diverticulosis or internal hemorrhoids or anal fissure. Based on his history and lack of pain I suspect internal hemorrhoids versus polyps. Diverticulosis is also a possibility. Plan to check CBC and metabolic panel and refer back to gastroenterology. Hemeoccult  not obtained today because patient has visible rectal bleeding at home.

## 2015-02-24 LAB — COMPLETE METABOLIC PANEL WITH GFR
ALBUMIN: 4 g/dL (ref 3.6–5.1)
ALK PHOS: 71 U/L (ref 40–115)
ALT: 26 U/L (ref 9–46)
AST: 17 U/L (ref 10–35)
BUN: 9 mg/dL (ref 7–25)
CALCIUM: 9.8 mg/dL (ref 8.6–10.3)
CO2: 29 mmol/L (ref 20–31)
CREATININE: 0.79 mg/dL (ref 0.70–1.33)
Chloride: 99 mmol/L (ref 98–110)
GFR, Est African American: >89 mL/min (ref >=60)
GFR, Est Non African American: >89 mL/min (ref >=60)
GLUCOSE: 209 mg/dL — AB (ref 65–99)
Potassium: 4.9 mmol/L (ref 3.5–5.3)
SODIUM: 137 mmol/L (ref 135–146)
Total Bilirubin: 0.3 mg/dL (ref 0.2–1.2)
Total Protein: 7.3 g/dL (ref 6.1–8.1)

## 2015-02-24 LAB — CBC
HCT: 45.5 % (ref 39.0–52.0)
HEMOGLOBIN: 14.9 g/dL (ref 13.0–17.0)
MCH: 28.4 pg (ref 26.0–34.0)
MCHC: 32.7 g/dL (ref 30.0–36.0)
MCV: 86.7 fL (ref 78.0–100.0)
MPV: 11.1 fL (ref 8.6–12.4)
Platelets: 224 10*3/uL (ref 150–400)
RBC: 5.25 MIL/uL (ref 4.22–5.81)
RDW: 13.9 % (ref 11.5–15.5)
WBC: 6.5 10*3/uL (ref 4.0–10.5)

## 2015-02-24 NOTE — Progress Notes (Signed)
Quick Note:  Labs are normal. F/u with GI ______

## 2015-05-13 ENCOUNTER — Ambulatory Visit (INDEPENDENT_AMBULATORY_CARE_PROVIDER_SITE_OTHER): Payer: Private Health Insurance - Indemnity | Admitting: Family Medicine

## 2015-05-13 ENCOUNTER — Encounter: Payer: Self-pay | Admitting: Family Medicine

## 2015-05-13 VITALS — BP 133/71 | HR 91 | Temp 98.1°F | Ht 67.0 in | Wt 303.0 lb

## 2015-05-13 DIAGNOSIS — M5489 Other dorsalgia: Secondary | ICD-10-CM | POA: Diagnosis not present

## 2015-05-13 DIAGNOSIS — N419 Inflammatory disease of prostate, unspecified: Secondary | ICD-10-CM | POA: Insufficient documentation

## 2015-05-13 DIAGNOSIS — N410 Acute prostatitis: Secondary | ICD-10-CM

## 2015-05-13 DIAGNOSIS — R1030 Lower abdominal pain, unspecified: Secondary | ICD-10-CM

## 2015-05-13 LAB — POCT URINALYSIS DIPSTICK
BILIRUBIN UA: NEGATIVE
Blood, UA: NEGATIVE
Glucose, UA: NEGATIVE
KETONES UA: NEGATIVE
LEUKOCYTES UA: NEGATIVE
Nitrite, UA: NEGATIVE
Protein, UA: 30
SPEC GRAV UA: 1.015
Urobilinogen, UA: 0.2
pH, UA: 7.5

## 2015-05-13 MED ORDER — CIPROFLOXACIN HCL 500 MG PO TABS: 500.0000 mg | ORAL_TABLET | Freq: Two times a day (BID) | ORAL | Status: DC

## 2015-05-13 NOTE — Assessment & Plan Note (Signed)
Symptoms likely due to prostatitis. Urine culture pending. Treatment with Cipro. Follow-up with PCP for abnormal EKG and for elevated blood sugar at the last check.

## 2015-05-13 NOTE — Patient Instructions (Signed)
Thank you for coming in today. If your belly pain worsens, or you have high fever, bad vomiting, blood in your stool or black tarry stool go to the Emergency Room.   Prostatitis The prostate gland is about the size and shape of a walnut. It is located just below your bladder. It produces one of the components of semen, which is made up of sperm and the fluids that help nourish and transport it out from the testicles. Prostatitis is inflammation of the prostate gland.  There are four types of prostatitis:  Acute bacterial prostatitis. This is the least common type of prostatitis. It starts quickly and usually is associated with a bladder infection, high fever, and shaking chills. It can occur at any age.  Chronic bacterial prostatitis. This is a persistent bacterial infection in the prostate. It usually develops from repeated acute bacterial prostatitis or acute bacterial prostatitis that was not properly treated. It can occur in men of any age but is most common in middle-aged men whose prostate has begun to enlarge. The symptoms are not as severe as those in acute bacterial prostatitis. Discomfort in the part of your body that is in front of your rectum and below your scrotum (perineum), lower abdomen, or in the head of your penis (glans) may represent your primary discomfort.  Chronic prostatitis (nonbacterial). This is the most common type of prostatitis. It is inflammation of the prostate gland that is not caused by a bacterial infection. The cause is unknown and may be associated with a viral infection or autoimmune disorder.  Prostatodynia (pelvic floor disorder). This is associated with increased muscular tone in the pelvis surrounding the prostate. CAUSES The causes of bacterial prostatitis are bacterial infection. The causes of the other types of prostatitis are unknown.  SYMPTOMS  Symptoms can vary depending upon the type of prostatitis that exists. There can also be overlap in symptoms.  Possible symptoms for each type of prostatitis are listed below. Acute Bacterial Prostatitis  Painful urination.  Fever or chills.  Muscle or joint pains.  Low back pain.  Low abdominal pain.  Inability to empty bladder completely. Chronic Bacterial Prostatitis, Chronic Nonbacterial Prostatitis, and Prostatodynia  Sudden urge to urinate.  Frequent urination.  Difficulty starting urine stream.  Weak urine stream.  Discharge from the urethra.  Dribbling after urination.  Rectal pain.  Pain in the testicles, penis, or tip of the penis.  Pain in the perineum.  Problems with sexual function.  Painful ejaculation.  Bloody semen. DIAGNOSIS  In order to diagnose prostatitis, your health care provider will ask about your symptoms. One or more urine samples will be taken and tested (urinalysis). If the urinalysis result is negative for bacteria, your health care provider may use a finger to feel your prostate (digital rectal exam). This exam helps your health care provider determine if your prostate is swollen and tender. It will also produce a specimen of semen that can be analyzed. TREATMENT  Treatment for prostatitis depends on the cause. If a bacterial infection is the cause, it can be treated with antibiotic medicine. In cases of chronic bacterial prostatitis, the use of antibiotics for up to 1 month or 6 weeks may be necessary. Your health care provider may instruct you to take sitz baths to help relieve pain. A sitz bath is a bath of hot water in which your hips and buttocks are under water. This relaxes the pelvic floor muscles and often helps to relieve the pressure on your prostate. HOME  CARE INSTRUCTIONS   Take all medicines as directed by your health care provider.  Take sitz baths as directed by your health care provider. SEEK MEDICAL CARE IF:   Your symptoms get worse, not better.  You have a fever. SEEK IMMEDIATE MEDICAL CARE IF:   You have chills.  You  feel nauseous or vomit.  You feel lightheaded or faint.  You are unable to urinate.  You have blood or blood clots in your urine. MAKE SURE YOU:  Understand these instructions.  Will watch your condition.  Will get help right away if you are not doing well or get worse.   This information is not intended to replace advice given to you by your health care provider. Make sure you discuss any questions you have with your health care provider.   Document Released: 06/10/2000 Document Revised: 07/04/2014 Document Reviewed: 12/31/2012 Elsevier Interactive Patient Education [employer].

## 2015-05-13 NOTE — Progress Notes (Signed)
Frederick Yang is a 54 y.o. male who presents to [facility]  today for pelvic pain. Patient notes pelvic pain starting today. Pain radiates to back. No fevers chills nausea vomiting or diarrhea. He became flushed and had chills today at work and was seen by the work nurse who noted blood pressure in the 90s over 50s. This quickly passed. At no time did he develop chest pain or palpitations or shortness of breath. He denies any dysuria. Symptoms are somewhat consistent with previous episodes of prostatitis.   Past Medical History  Diagnosis Date  . Prostate hypertrophy   . Hyperlipidemia   . GERD (gastroesophageal reflux disease)    Past Surgical History  Procedure Laterality Date  . Hernia repair      RT groin  . Appendectomy    . Treatment fistula anal    . Hemorrhoid banding  06/24/13    Dr. Ludwig Lean  . Hemorrhoid banding  07/12/2013  . Right shoulder surgery  03/06/2014    Dr. Tamera Punt    Social History  Substance Use Topics  . Smoking status: Former Smoker    Types: Cigarettes    Quit date: 06/27/1988  . Smokeless tobacco: Never Used  . Alcohol Use: Yes   family history includes Alcohol abuse in his maternal uncle; Breast cancer (age of onset: 68) in his mother; Diabetes in his maternal aunt and sister; Heart attack in his maternal uncle; Hypertension in his brother and sister; Lung cancer in his maternal grandfather; Stroke in his maternal uncle; Uterine cancer in his mother.  ROS as above Medications: Current Outpatient Prescriptions  Medication Sig Dispense Refill  . APPLE CIDER VINEGAR PO Take 30 mLs by mouth daily.    . Calcium-Magnesium-Vitamin D (CALCIUM 500 PO) Take 500 mg by mouth 2 (two) times daily.    . Cinnamon 500 MG capsule Take 2,000 mg by mouth daily.    . Diclofenac Sodium 2 % SOLN Place 2 sprays onto the skin 2 (two) times daily. 1 Bottle 11  . FIBER PO Take 9 g by mouth 2 (two) times daily.    . fish oil-omega-3  fatty acids 1000 MG capsule Take 3,000 g by mouth daily.    . Garlic 123XX123 MG CAPS Take 1,000 mg by mouth.    Marland Kitchen glucosamine-chondroitin 500-400 MG tablet Take 1 tablet by mouth 3 (three) times daily.    . HONEY PO Take 30 mLs by mouth daily.    . Magnesium 500 MG CAPS Take 500 mg by mouth.    . Multiple Vitamins-Minerals (MULTIVITAMIN WITH MINERALS) tablet Take 1 tablet by mouth daily.    . niacin 500 MG tablet Take 500 mg by mouth daily with breakfast.    . ciprofloxacin (CIPRO) 500 MG tablet Take 1 tablet (500 mg total) by mouth 2 (two) times daily. 28 tablet 0   No current facility-administered medications for this visit.   No Known Allergies   Exam:  BP 133/71 mmHg  Pulse 91  Temp(Src) 98.1 F (36.7 C) (Oral)  Ht 5\' 7"  (1.702 m)  Wt 303 lb (137.44 kg)  BMI 47.45 kg/m2 Gen: Well NAD obese HEENT: EOMI,  MMM Lungs: Normal work of breathing. CTABL Heart: RRR no MRG Abd: NABS, Soft. Nondistended, Nontender to upper abdomen. Mildly tender to palpation central pelvis. No CVA tenderness to percussion. Back is nontender. Exts: Brisk capillary refill, warm and well perfused.  Genital: Testicles are descended bilaterally nontender with no hernias present Rectal:  Well appearing anus. Normal rectal tone. Tender boggy prostate without nodules present.  Twelve-lead EKG shows normal sinus rhythm at 94 bpm. No ST segment elevation or depression. Minimal voltage for criteria for LVH present. No acute MI. No prior studies to compare to.  Results for orders placed or performed in visit on 05/13/15 (from the past 24 hour(s))  POCT Urinalysis Dipstick     Status: Normal   Collection Time: 05/13/15 11:38 AM  Result Value Ref Range   Color, UA yellow    Clarity, UA clear    Glucose, UA neg    Bilirubin, UA neg    Ketones, UA neg    Spec Grav, UA 1.015    Blood, UA neg    pH, UA 7.5    Protein, UA 30    Urobilinogen, UA 0.2    Nitrite, UA neg    Leukocytes, UA Negative Negative   No  results found.   Please see individual assessment and plan sections.

## 2015-05-14 LAB — URINE CULTURE
Colony Count: NO GROWTH
Organism ID, Bacteria: NO GROWTH

## 2015-05-28 ENCOUNTER — Ambulatory Visit (INDEPENDENT_AMBULATORY_CARE_PROVIDER_SITE_OTHER): Payer: Private Health Insurance - Indemnity | Admitting: Family Medicine

## 2015-05-28 ENCOUNTER — Encounter: Payer: Self-pay | Admitting: Family Medicine

## 2015-05-28 VITALS — BP 142/78 | HR 79 | Temp 97.7°F | Resp 18 | Wt 302.2 lb

## 2015-05-28 DIAGNOSIS — N410 Acute prostatitis: Secondary | ICD-10-CM

## 2015-05-28 DIAGNOSIS — K625 Hemorrhage of anus and rectum: Secondary | ICD-10-CM

## 2015-05-28 DIAGNOSIS — E1165 Type 2 diabetes mellitus with hyperglycemia: Secondary | ICD-10-CM | POA: Diagnosis not present

## 2015-05-28 DIAGNOSIS — E119 Type 2 diabetes mellitus without complications: Secondary | ICD-10-CM | POA: Insufficient documentation

## 2015-05-28 DIAGNOSIS — IMO0001 Reserved for inherently not codable concepts without codable children: Secondary | ICD-10-CM

## 2015-05-28 DIAGNOSIS — E1159 Type 2 diabetes mellitus with other circulatory complications: Secondary | ICD-10-CM | POA: Insufficient documentation

## 2015-05-28 MED ORDER — AMBULATORY NON FORMULARY MEDICATION: Status: DC

## 2015-05-28 MED ORDER — METFORMIN HCL 500 MG PO TABS: 500.0000 mg | ORAL_TABLET | Freq: Two times a day (BID) | ORAL | Status: DC

## 2015-05-28 NOTE — Progress Notes (Signed)
   Subjective:    Patient ID: Frederick Yang, male    DOB: 1961/05/27, 54 y.o.   MRN: [id number]  HPI Acute prostatis - Says he feels about 90% better. Says the last 2 days have been much better.  He is done with the Antibiotic.  Getting his appetite back.    IFG  - it has been well over a year since his last hemoglobin A1c and we have been following him for impaired fasting glucose. He is due to recheck that today. No increased thirst or urination. He does admit that he hasn't been taking as good care of himself. He's been not eating as healthy and has not been working out at all.  Rectal bleeding has been better did see GI back and they felt it was a hrmorrhoid flare.  Due for 3 year f/u colonoscopy is next year in 2017.    Review of Systems     Objective:   Physical Exam  Constitutional: He is oriented to person, place, and time. He appears well-developed and well-nourished.  HENT:  Head: Normocephalic and atraumatic.  Cardiovascular: Normal rate, regular rhythm and normal heart sounds.   Pulmonary/Chest: Effort normal and breath sounds normal.  Musculoskeletal:  No CVA tenderness   Neurological: He is alert and oriented to person, place, and time.  Skin: Skin is warm and dry.  Psychiatric: He has a normal mood and affect. His behavior is normal.          Assessment & Plan:  Acute prostatitis - symptoms are significantly improved especially over the last 2 days. No need for further extension of antibiotic's. Call if symptoms recur.  REctal bleeding. Improved. He is Re: Followed up with GI and they've discussed this most likely from hemorrhoids. Plan to keep on schedule for colonoscopy for next year.  New Dx of Diabetes - discussed referral for diabetes and nutrition as well as getting him a prescription for his own glucometer. I'm going go ahead and start him on metformin and discussed the potential side effects and risks of the medication. He was very hesitant at first but  says he would be willing to try it. He actually has a physical plan for January so I will see him back then and see how is doing on the medication.

## 2015-05-29 MED ORDER — AMBULATORY NON FORMULARY MEDICATION: Status: DC

## 2015-05-29 NOTE — Addendum Note (Signed)
Addended by: Elizabeth Sauer on: 05/29/2015 03:07 PM   Modules accepted: Orders

## 2015-06-02 ENCOUNTER — Telehealth: Payer: Self-pay

## 2015-06-02 MED ORDER — SULFAMETHOXAZOLE-TRIMETHOPRIM 800-160 MG PO TABS: 1.0000 | ORAL_TABLET | Freq: Two times a day (BID) | ORAL | Status: DC

## 2015-06-02 NOTE — Telephone Encounter (Signed)
Patient started having urinary symptoms over the weekend and still has symptoms. He has nausea, tired, fatigue, low abdominal pain, groin pain and pain in low back. Denies fever, chills or sweats. He would like another antibiotic.

## 2015-06-02 NOTE — Telephone Encounter (Signed)
Left detailed message.   

## 2015-06-02 NOTE — Telephone Encounter (Signed)
Prescription sent to pharmacy for Bactrim to take for 4 weeks. Please have them schedule appointment in 4 weeks to follow-up just to make sure that he is better.

## 2015-06-04 ENCOUNTER — Emergency Department (HOSPITAL_BASED_OUTPATIENT_CLINIC_OR_DEPARTMENT_OTHER): Payer: Managed Care, Other (non HMO)

## 2015-06-04 ENCOUNTER — Encounter (HOSPITAL_BASED_OUTPATIENT_CLINIC_OR_DEPARTMENT_OTHER): Payer: Self-pay

## 2015-06-04 ENCOUNTER — Ambulatory Visit (INDEPENDENT_AMBULATORY_CARE_PROVIDER_SITE_OTHER): Payer: Private Health Insurance - Indemnity | Admitting: Family Medicine

## 2015-06-04 ENCOUNTER — Encounter: Payer: Self-pay | Admitting: Family Medicine

## 2015-06-04 ENCOUNTER — Inpatient Hospital Stay (HOSPITAL_BASED_OUTPATIENT_CLINIC_OR_DEPARTMENT_OTHER)
Admission: EM | Admit: 2015-06-04 | Discharge: 2015-06-08 | DRG: 392 | Disposition: A | Discharge status: Home / Self Care | Payer: Managed Care, Other (non HMO) | Attending: Internal Medicine | Admitting: Internal Medicine

## 2015-06-04 VITALS — BP 84/59 | HR 92 | Temp 99.1°F | Ht 67.0 in | Wt 295.0 lb

## 2015-06-04 DIAGNOSIS — Z7984 Long term (current) use of oral hypoglycemic drugs: Secondary | ICD-10-CM

## 2015-06-04 DIAGNOSIS — E1165 Type 2 diabetes mellitus with hyperglycemia: Secondary | ICD-10-CM | POA: Diagnosis present

## 2015-06-04 DIAGNOSIS — K5792 Diverticulitis of intestine, part unspecified, without perforation or abscess without bleeding: Secondary | ICD-10-CM | POA: Diagnosis present

## 2015-06-04 DIAGNOSIS — N400 Enlarged prostate without lower urinary tract symptoms: Secondary | ICD-10-CM | POA: Diagnosis present

## 2015-06-04 DIAGNOSIS — I959 Hypotension, unspecified: Secondary | ICD-10-CM

## 2015-06-04 DIAGNOSIS — E785 Hyperlipidemia, unspecified: Secondary | ICD-10-CM | POA: Diagnosis present

## 2015-06-04 DIAGNOSIS — K5732 Diverticulitis of large intestine without perforation or abscess without bleeding: Secondary | ICD-10-CM | POA: Diagnosis present

## 2015-06-04 DIAGNOSIS — R1030 Lower abdominal pain, unspecified: Secondary | ICD-10-CM

## 2015-06-04 DIAGNOSIS — K219 Gastro-esophageal reflux disease without esophagitis: Secondary | ICD-10-CM | POA: Diagnosis present

## 2015-06-04 DIAGNOSIS — M545 Low back pain: Secondary | ICD-10-CM | POA: Diagnosis present

## 2015-06-04 DIAGNOSIS — G8929 Other chronic pain: Secondary | ICD-10-CM | POA: Diagnosis present

## 2015-06-04 DIAGNOSIS — K5720 Diverticulitis of large intestine with perforation and abscess without bleeding: Principal | ICD-10-CM | POA: Insufficient documentation

## 2015-06-04 DIAGNOSIS — Z79899 Other long term (current) drug therapy: Secondary | ICD-10-CM

## 2015-06-04 DIAGNOSIS — E119 Type 2 diabetes mellitus without complications: Secondary | ICD-10-CM | POA: Diagnosis present

## 2015-06-04 DIAGNOSIS — E1159 Type 2 diabetes mellitus with other circulatory complications: Secondary | ICD-10-CM | POA: Diagnosis present

## 2015-06-04 DIAGNOSIS — Z87891 Personal history of nicotine dependence: Secondary | ICD-10-CM | POA: Diagnosis not present

## 2015-06-04 HISTORY — DX: Type 2 diabetes mellitus without complications: E11.9

## 2015-06-04 LAB — CBC WITH DIFFERENTIAL/PLATELET
BASOS PCT: 0 %
Basophils Absolute: 0 10*3/uL (ref 0.0–0.1)
Eosinophils Absolute: 0.1 10*3/uL (ref 0.0–0.7)
Eosinophils Relative: 1 %
HEMATOCRIT: 44.8 % (ref 39.0–52.0)
HEMOGLOBIN: 14.6 g/dL (ref 13.0–17.0)
LYMPHS ABS: 1.5 10*3/uL (ref 0.7–4.0)
Lymphocytes Relative: 13 %
MCH: 28.5 pg (ref 26.0–34.0)
MCHC: 32.6 g/dL (ref 30.0–36.0)
MCV: 87.3 fL (ref 78.0–100.0)
MONOS PCT: 7 %
Monocytes Absolute: 0.8 10*3/uL (ref 0.1–1.0)
NEUTROS ABS: 9.1 10*3/uL — AB (ref 1.7–7.7)
Neutrophils Relative %: 79 %
Platelets: 268 10*3/uL (ref 150–400)
RBC: 5.13 MIL/uL (ref 4.22–5.81)
RDW: 13.1 % (ref 11.5–15.5)
WBC: 11.4 10*3/uL — ABNORMAL HIGH (ref 4.0–10.5)

## 2015-06-04 LAB — COMPREHENSIVE METABOLIC PANEL
ALT: 30 U/L (ref 17–63)
ANION GAP: 7 (ref 5–15)
AST: 25 U/L (ref 15–41)
Albumin: 4 g/dL (ref 3.5–5.0)
Alkaline Phosphatase: 78 U/L (ref 38–126)
BILIRUBIN TOTAL: 0.9 mg/dL (ref 0.3–1.2)
BUN: 10 mg/dL (ref 6–20)
CO2: 25 mmol/L (ref 22–32)
Calcium: 9.2 mg/dL (ref 8.9–10.3)
Chloride: 102 mmol/L (ref 101–111)
Creatinine, Ser: 0.84 mg/dL (ref 0.61–1.24)
GFR calc Af Amer: >60 mL/min (ref >60)
GFR calc non Af Amer: >60 mL/min (ref >60)
Glucose, Bld: 102 mg/dL — ABNORMAL HIGH (ref 65–99)
POTASSIUM: 4 mmol/L (ref 3.5–5.1)
Sodium: 134 mmol/L — ABNORMAL LOW (ref 135–145)
TOTAL PROTEIN: 8.6 g/dL — AB (ref 6.5–8.1)

## 2015-06-04 LAB — POCT URINALYSIS DIPSTICK
Bilirubin, UA: NEGATIVE
Blood, UA: NEGATIVE
Glucose, UA: NEGATIVE
Ketones, UA: NEGATIVE
LEUKOCYTES UA: NEGATIVE
Nitrite, UA: NEGATIVE
PH UA: 6
PROTEIN UA: NEGATIVE
SPEC GRAV UA: 1.015
Urobilinogen, UA: 0.2

## 2015-06-04 LAB — URINALYSIS, ROUTINE W REFLEX MICROSCOPIC
Bilirubin Urine: NEGATIVE
Glucose, UA: NEGATIVE mg/dL
HGB URINE DIPSTICK: NEGATIVE
Ketones, ur: 15 mg/dL — AB
Leukocytes, UA: NEGATIVE
Nitrite: NEGATIVE
Protein, ur: NEGATIVE mg/dL
Specific Gravity, Urine: 1.012 (ref 1.005–1.030)
pH: 5.5 (ref 5.0–8.0)

## 2015-06-04 LAB — LIPASE, BLOOD: Lipase: 24 U/L (ref 11–51)

## 2015-06-04 LAB — I-STAT CG4 LACTIC ACID, ED: LACTIC ACID, VENOUS: 1.89 mmol/L (ref 0.5–2.0)

## 2015-06-04 LAB — GLUCOSE, CAPILLARY: GLUCOSE-CAPILLARY: 107 mg/dL — AB (ref 65–99)

## 2015-06-04 MED ORDER — IOHEXOL 300 MG/ML  SOLN
25.0000 mL | Freq: Once | INTRAMUSCULAR | Status: AC | PRN
  Administered 2015-06-04: 25 mL via ORAL

## 2015-06-04 MED ORDER — SODIUM CHLORIDE 0.9 % IV SOLN: INTRAVENOUS | Status: DC

## 2015-06-04 MED ORDER — SODIUM CHLORIDE 0.9 % IV SOLN
INTRAVENOUS | Status: DC
  Administered 2015-06-04 – 2015-06-05 (×2): via INTRAVENOUS

## 2015-06-04 MED ORDER — IOHEXOL 300 MG/ML  SOLN
100.0000 mL | Freq: Once | INTRAMUSCULAR | Status: AC | PRN
  Administered 2015-06-04: 100 mL via INTRAVENOUS

## 2015-06-04 MED ORDER — SODIUM CHLORIDE 0.9 % IV BOLUS (SEPSIS)
1000.0000 mL | Freq: Once | INTRAVENOUS | Status: AC
  Administered 2015-06-04: 1000 mL via INTRAVENOUS

## 2015-06-04 MED ORDER — METRONIDAZOLE IN NACL 5-0.79 MG/ML-% IV SOLN
500.0000 mg | Freq: Once | INTRAVENOUS | Status: AC
  Administered 2015-06-04: 500 mg via INTRAVENOUS
  Filled 2015-06-04: qty 100

## 2015-06-04 MED ORDER — CIPROFLOXACIN IN D5W 400 MG/200ML IV SOLN
400.0000 mg | Freq: Once | INTRAVENOUS | Status: AC
  Administered 2015-06-04: 400 mg via INTRAVENOUS
  Filled 2015-06-04: qty 200

## 2015-06-04 MED ORDER — ONDANSETRON HCL 4 MG/2ML IJ SOLN
4.0000 mg | Freq: Once | INTRAMUSCULAR | Status: AC
  Administered 2015-06-04: 4 mg via INTRAVENOUS
  Filled 2015-06-04: qty 2

## 2015-06-04 NOTE — ED Notes (Signed)
MD at bedside. 

## 2015-06-04 NOTE — ED Notes (Signed)
MD at bedside discussing plan of care.

## 2015-06-04 NOTE — ED Provider Notes (Addendum)
CSN: [id number]     Arrival date & time 06/04/15  1352 History   First MD Initiated Contact with Patient 06/04/15 1514     Chief Complaint  Patient presents with  . Abdominal Pain  . Hypotension     (Consider location/radiation/quality/duration/timing/severity/associated sxs/prior Treatment) Patient is a 54 y.o. male presenting with abdominal pain. The history is provided by the patient.  Abdominal Pain Associated symptoms: fatigue and nausea   Associated symptoms: no chest pain, no diarrhea, no dysuria, no shortness of breath and no vomiting    patient with a complaint of some abdominal pain lower quadrant suprapubic area since November 16. Seen by primary care doctor clinically felt to be possible prostate Tate infection. Patient started on antibiotic believe it was Cipro cut some improvement took that antibiotic for 2 weeks. Then after stopping the antibiotic this Friday started to have symptoms come back. Started again on Monday with Septra double strength for a month. Symptoms include suprapubic abdominal pain some nausea and dizziness no vomiting and occasionally low blood pressure. However blood pressure is fine here. Patient denies any dysuria states that he feels as if there is been a low-grade fever however there has been urinary frequency.  Past Medical History  Diagnosis Date  . Prostate hypertrophy   . Hyperlipidemia   . GERD (gastroesophageal reflux disease)   . Diabetes mellitus without complication [facility])    Past Surgical History  Procedure Laterality Date  . Hernia repair      RT groin  . Appendectomy    . Treatment fistula anal    . Hemorrhoid banding  06/24/13    Dr. Ludwig Lean  . Hemorrhoid banding  07/12/2013  . Right shoulder surgery  03/06/2014    Dr. Tamera Punt    Family History  Problem Relation Age of Onset  . Breast cancer Mother 56  . Alcohol abuse Maternal Uncle   . Heart attack Maternal Uncle   . Stroke Maternal Uncle   . Lung cancer Maternal  Grandfather   . Diabetes Maternal Aunt   . Hypertension Brother   . Hypertension Sister   . Diabetes Sister   . Uterine cancer Mother    Social History  Substance Use Topics  . Smoking status: Former Smoker    Types: Cigarettes    Quit date: 06/27/1988  . Smokeless tobacco: Never Used  . Alcohol Use: Yes    Review of Systems  Constitutional: Positive for fatigue.  HENT: Negative for congestion.   Eyes: Negative for visual disturbance.  Respiratory: Negative for shortness of breath.   Cardiovascular: Negative for chest pain.  Gastrointestinal: Positive for nausea and abdominal pain. Negative for vomiting and diarrhea.  Genitourinary: Positive for frequency. Negative for dysuria.  Musculoskeletal: Negative for back pain.  Skin: Negative for rash.  Neurological: Positive for dizziness. Negative for syncope.  Hematological: Does not bruise/bleed easily.  Psychiatric/Behavioral: Negative for confusion.      Allergies  Review of patient's allergies indicates no known allergies.  Home Medications   Prior to Admission medications   Medication Sig Start Date End Date Taking? Authorizing Provider  AMBULATORY NON FORMULARY MEDICATION Medication Name: glucometer, lancets and strips to test once a day.   Dx: diabetes, uncontrolled, type 2 ICD 10: 250.00 05/29/15   Hali Marry, MD  APPLE CIDER VINEGAR PO Take 30 mLs by mouth daily.    Historical Provider, MD  Calcium-Magnesium-Vitamin D (CALCIUM 500 PO) Take 500 mg by mouth 2 (two) times daily.  Historical Provider, MD  Cinnamon 500 MG capsule Take 2,000 mg by mouth daily.    Historical Provider, MD  Diclofenac Sodium 2 % SOLN Place 2 sprays onto the skin 2 (two) times daily. 12/01/14   Silverio Decamp, MD  FIBER PO Take 9 g by mouth 2 (two) times daily.    Historical Provider, MD  fish oil-omega-3 fatty acids 1000 MG capsule Take 3,000 g by mouth daily.    Historical Provider, MD  Garlic 123XX123 MG CAPS Take 1,000 mg  by mouth.    Historical Provider, MD  glucosamine-chondroitin 500-400 MG tablet Take 1 tablet by mouth 3 (three) times daily.    Historical Provider, MD  HONEY PO Take 30 mLs by mouth daily.    Historical Provider, MD  Magnesium 500 MG CAPS Take 500 mg by mouth.    Historical Provider, MD  metFORMIN (GLUCOPHAGE) 500 MG tablet Take 1 tablet (500 mg total) by mouth 2 (two) times daily with a meal. 05/28/15   Hali Marry, MD  Multiple Vitamins-Minerals (MULTIVITAMIN WITH MINERALS) tablet Take 1 tablet by mouth daily.    Historical Provider, MD  niacin 500 MG tablet Take 500 mg by mouth daily with breakfast.    Historical Provider, MD  sulfamethoxazole-trimethoprim (BACTRIM DS,SEPTRA DS) 800-160 MG tablet Take 1 tablet by mouth 2 (two) times daily. 06/02/15   Hali Marry, MD   BP 113/63 mmHg  Pulse 82  Temp(Src) 99.4 F (37.4 C) (Oral)  Resp 16  SpO2 100% Physical Exam  Constitutional: He is oriented to person, place, and time. He appears well-developed and well-nourished. No distress.  HENT:  Head: Normocephalic.  Mouth/Throat: Oropharynx is clear and moist.  Eyes: Pupils are equal, round, and reactive to light.  Neck: Normal range of motion. Neck supple.  Cardiovascular: Normal rate, regular rhythm and normal heart sounds.   No murmur heard. Pulmonary/Chest: Effort normal and breath sounds normal. No respiratory distress.  Abdominal: Soft. Bowel sounds are normal. There is tenderness.  Slight tenderness suprapubic area.  Musculoskeletal: Normal range of motion.  Neurological: He is alert and oriented to person, place, and time. No cranial nerve deficit. He exhibits normal muscle tone. Coordination normal.  Skin: Skin is warm. No rash noted.  Nursing note and vitals reviewed.   ED Course  Procedures (including critical care time) Labs Review Labs Reviewed  COMPREHENSIVE METABOLIC PANEL - Abnormal; Notable for the following:    Sodium 134 (*)    Glucose, Bld 102 (*)     Total Protein 8.6 (*)    All other components within normal limits  URINALYSIS, ROUTINE W REFLEX MICROSCOPIC (NOT AT [facility]) - Abnormal; Notable for the following:    Ketones, ur 15 (*)    All other components within normal limits  CBC WITH DIFFERENTIAL/PLATELET - Abnormal; Notable for the following:    WBC 11.4 (*)    Neutro Abs 9.1 (*)    All other components within normal limits  CULTURE, BLOOD (ROUTINE X 2)  CULTURE, BLOOD (ROUTINE X 2)  LIPASE, BLOOD  LACTIC ACID, PLASMA  I-STAT CG4 LACTIC ACID, ED   Results for orders placed or performed during the hospital encounter of 06/04/15  Comprehensive metabolic panel  Result Value Ref Range   Sodium 134 (L) 135 - 145 mmol/L   Potassium 4.0 3.5 - 5.1 mmol/L   Chloride 102 101 - 111 mmol/L   CO2 25 22 - 32 mmol/L   Glucose, Bld 102 (H) 65 - 99 mg/dL  BUN 10 6 - 20 mg/dL   Creatinine, Ser 0.84 0.61 - 1.24 mg/dL   Calcium 9.2 8.9 - 10.3 mg/dL   Total Protein 8.6 (H) 6.5 - 8.1 g/dL   Albumin 4.0 3.5 - 5.0 g/dL   AST 25 15 - 41 U/L   ALT 30 17 - 63 U/L   Alkaline Phosphatase 78 38 - 126 U/L   Total Bilirubin 0.9 0.3 - 1.2 mg/dL   GFR calc non Af Amer >60 >60 mL/min   GFR calc Af Amer >60 >60 mL/min   Anion gap 7 5 - 15  Urinalysis, Routine w reflex microscopic (not at [facility])  Result Value Ref Range   Color, Urine YELLOW YELLOW   APPearance CLEAR CLEAR   Specific Gravity, Urine 1.012 1.005 - 1.030   pH 5.5 5.0 - 8.0   Glucose, UA NEGATIVE NEGATIVE mg/dL   Hgb urine dipstick NEGATIVE NEGATIVE   Bilirubin Urine NEGATIVE NEGATIVE   Ketones, ur 15 (A) NEGATIVE mg/dL   Protein, ur NEGATIVE NEGATIVE mg/dL   Nitrite NEGATIVE NEGATIVE   Leukocytes, UA NEGATIVE NEGATIVE  CBC with Differential/Platelet  Result Value Ref Range   WBC 11.4 (H) 4.0 - 10.5 K/uL   RBC 5.13 4.22 - 5.81 MIL/uL   Hemoglobin 14.6 13.0 - 17.0 g/dL   HCT 44.8 39.0 - 52.0 %   MCV 87.3 78.0 - 100.0 fL   MCH 28.5 26.0 - 34.0 pg   MCHC 32.6 30.0 - 36.0 g/dL    RDW 13.1 11.5 - 15.5 %   Platelets 268 150 - 400 K/uL   Neutrophils Relative % 79 %   Neutro Abs 9.1 (H) 1.7 - 7.7 K/uL   Lymphocytes Relative 13 %   Lymphs Abs 1.5 0.7 - 4.0 K/uL   Monocytes Relative 7 %   Monocytes Absolute 0.8 0.1 - 1.0 K/uL   Eosinophils Relative 1 %   Eosinophils Absolute 0.1 0.0 - 0.7 K/uL   Basophils Relative 0 %   Basophils Absolute 0.0 0.0 - 0.1 K/uL  Lipase, blood  Result Value Ref Range   Lipase 24 11 - 51 U/L  I-Stat CG4 Lactic Acid, ED  Result Value Ref Range   Lactic Acid, Venous 1.89 0.5 - 2.0 mmol/L     Imaging Review Dg Chest 2 View  06/04/2015  CLINICAL DATA:  Nausea and abdominal pain EXAM: CHEST - 2 VIEW COMPARISON:  None. FINDINGS: The heart size and mediastinal contours are within normal limits. Both lungs are clear. The visualized skeletal structures are unremarkable. IMPRESSION: No active disease. Electronically Signed   By: Inez Catalina M.D.   On: 06/04/2015 16:42   Ct Abdomen Pelvis W Contrast  06/04/2015  CLINICAL DATA:  54 year old male with prostatitis November 2016. Groin pain. Blood pressure dropping. Nausea. Hyperlipidemia. Diabetes. Post appendectomy. Initial encounter. EXAM: CT ABDOMEN AND PELVIS WITH CONTRAST TECHNIQUE: Multidetector CT imaging of the abdomen and pelvis was performed using the standard protocol following bolus administration of intravenous contrast. CONTRAST:  12mL OMNIPAQUE IOHEXOL 300 MG/ML SOLN, 73mL OMNIPAQUE IOHEXOL 300 MG/ML SOLN COMPARISON:  None. FINDINGS: Sigmoid mesentery loculated free air with surrounding inflammation consistent with sigmoid colon diverticulitis without drainable abscess. Fatty infiltration of the liver. No worrisome hepatic, splenic, pancreatic, renal or adrenal lesion. 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 Lung bases clear.  Heart top-normal in size. No abdominal aortic aneurysm.  No adenopathy. No calcified gallstones. Degenerative changes lower lumbar spine without osseous  destructive lesion. Noncontrast filled views the urinary  bladder unremarkable. Prostate gland top-normal in size. IMPRESSION: Sigmoid mesentery loculated free air with surrounding inflammation consistent with sigmoid colon diverticulitis without drainable abscess. Fatty infiltration of the liver. 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 Degenerative changes lower lumbar spine without osseous destructive lesion. Prostate gland top-normal in size. These results were called by telephone at the time of interpretation on 06/04/2015 at 4:41 pm to Dr. Fredia Sorrow , who verbally acknowledged these results. Electronically Signed   By: Genia Del M.D.   On: 06/04/2015 16:46   I have personally reviewed and evaluated these images and lab results as part of my medical decision-making.   EKG Interpretation None      MDM   Final diagnoses:  Diverticulitis of large intestine with perforation without bleeding    CT scan shows evidence of sigmoid colon diverticulitis with a contained perforation no evidence of any drainable abscess. This would explain patient's symptoms. Patient without any evidence of sepsis. Will start of Cipro and Flagyl IV antibiotics we'll make arrangements for admission to hospitalist service with surgical consult.  Discussed with general surgery Dr. Harlow Asa he will follow patient up in consultation. Hospitalist at [facility] will admit to a MedSurg bed. Temporary admit orders and transfer papers completed.  Fredia Sorrow, MD 06/04/15 1717  Fredia Sorrow, MD 06/04/15 [phone]

## 2015-06-04 NOTE — Progress Notes (Signed)
Subjective:    Patient ID: Frederick Yang, male    DOB: Aug 01, 1960, 54 y.o.   MRN: [id number]  HPI Over the weekend says to feel more tired and having some occ pain in her groin.  Called and started the antibiotic, Bactrim,  Tues night and felt better yesterday.  Then this morning felt Ok and went to work but then by 7 AM started having some back pain that felt likt it was radiating into the right buttock Now feeling nauseated and lightheaded.  Says doesn't usually eat in the mornings.  Feels weak and drained.  Says his nailbed looked dark.  Had some chills this AM as well. Saw the nurse at work and BP was 93/65.  Temp 99.5 and HR was 105.  More pelvic pressure when his sits for a period  Pain radiates to the groin crease but not into the testicles.  Laying down feels better.     Review of Systems   BP 84/59 mmHg  Pulse 92  Temp(Src) 99.1 F (37.3 C) (Oral)  Ht 5\' 7"  (1.702 m)  Wt 295 lb (133.811 kg)  BMI 46.19 kg/m2    No Known Allergies  Past Medical History  Diagnosis Date  . Prostate hypertrophy   . Hyperlipidemia   . GERD (gastroesophageal reflux disease)     Past Surgical History  Procedure Laterality Date  . Hernia repair      RT groin  . Appendectomy    . Treatment fistula anal    . Hemorrhoid banding  06/24/13    Dr. Ludwig Lean  . Hemorrhoid banding  07/12/2013  . Right shoulder surgery  03/06/2014    Dr. Tamera Punt     Social History   Social History  . Marital Status: Married    Spouse Name: N/A  . Number of Children: 3   . Years of Education: N/A   Occupational History  .  [place] History Main Topics  . Smoking status: Former Smoker    Types: Cigarettes    Quit date: 06/27/1988  . Smokeless tobacco: Never Used  . Alcohol Use: Yes  . Drug Use: No  . Sexual Activity: Not on file   Other Topics Concern  . Not on file   Social History Narrative   Started some exercise. Occ caffeine but not daily.     Family History  Problem  Relation Age of Onset  . Breast cancer Mother 78  . Alcohol abuse Maternal Uncle   . Heart attack Maternal Uncle   . Stroke Maternal Uncle   . Lung cancer Maternal Grandfather   . Diabetes Maternal Aunt   . Hypertension Brother   . Hypertension Sister   . Diabetes Sister   . Uterine cancer Mother     Outpatient Encounter Prescriptions as of 06/04/2015  Medication Sig  . AMBULATORY NON FORMULARY MEDICATION Medication Name: glucometer, lancets and strips to test once a day.   Dx: diabetes, uncontrolled, type 2 ICD 10: 250.00  . APPLE CIDER VINEGAR PO Take 30 mLs by mouth daily.  . Calcium-Magnesium-Vitamin D (CALCIUM 500 PO) Take 500 mg by mouth 2 (two) times daily.  . Cinnamon 500 MG capsule Take 2,000 mg by mouth daily.  . Diclofenac Sodium 2 % SOLN Place 2 sprays onto the skin 2 (two) times daily.  Marland Kitchen FIBER PO Take 9 g by mouth 2 (two) times daily.  . fish oil-omega-3 fatty acids 1000 MG capsule Take 3,000 g by mouth daily.  Marland Kitchen  Garlic 123XX123 MG CAPS Take 1,000 mg by mouth.  Marland Kitchen glucosamine-chondroitin 500-400 MG tablet Take 1 tablet by mouth 3 (three) times daily.  . HONEY PO Take 30 mLs by mouth daily.  . Magnesium 500 MG CAPS Take 500 mg by mouth.  . metFORMIN (GLUCOPHAGE) 500 MG tablet Take 1 tablet (500 mg total) by mouth 2 (two) times daily with a meal.  . Multiple Vitamins-Minerals (MULTIVITAMIN WITH MINERALS) tablet Take 1 tablet by mouth daily.  . niacin 500 MG tablet Take 500 mg by mouth daily with breakfast.  . sulfamethoxazole-trimethoprim (BACTRIM DS,SEPTRA DS) 800-160 MG tablet Take 1 tablet by mouth 2 (two) times daily.   No facility-administered encounter medications on file as of 06/04/2015.         Objective:   Physical Exam  Constitutional: He is oriented to person, place, and time. He appears well-developed and well-nourished.  HENT:  Head: Normocephalic and atraumatic.  Right Ear: External ear normal.  Left Ear: External ear normal.  Nose: Nose normal.   Mouth/Throat: Oropharynx is clear and moist.  TMs and canals are clear.   Eyes: Conjunctivae and EOM are normal. Pupils are equal, round, and reactive to light.  Neck: Neck supple. No thyromegaly present.  Cardiovascular: Normal rate, regular rhythm and normal heart sounds.   Pulmonary/Chest: Effort normal and breath sounds normal.  Abdominal: Soft. Bowel sounds are normal. He exhibits no distension and no mass. There is tenderness. There is no rebound and no guarding.  + TTP in the suprapubic area and RLQ and LLQ.  More tender in the LLQ.  No rebound or guarding.    Musculoskeletal:  No CVA tenderness. nontender over the lumbar spine.    Lymphadenopathy:    He has no cervical adenopathy.  Neurological: He is alert and oriented to person, place, and time.  Skin: Skin is warm and dry.  Psychiatric: He has a normal mood and affect. His behavior is normal.          Assessment & Plan:  Pelvic pain - worrisome for diverticulitis.  I am also concerned at he has low BP, was tachy earlier today and low grade temp.  He has been gradually getting worse today, now on Day 2 of Bactrim.  Discussed options. Bc of unstable vitals recommend he go to the ED. His wife is here with him today and is driving. Go to [facility] ED. Will need Cr bc of diabetes hx and CT with contrast of Abd/pelvis.   Needs stat CBC as well. Depending on WBC may meet SIRS criteria.    Hypotension - likely secondary to infection.  Will likely need IV fluids.

## 2015-06-04 NOTE — Progress Notes (Signed)
54 year old gentleman coming in from [facility] for diverticulitis. Surgery already consulted by EDP and he will be coming to MED surg bed for further evaluation and management.    Hosie Poisson, MD [phone]

## 2015-06-04 NOTE — ED Notes (Signed)
Reports abdominal pain, headache chills, fever.  Pt reports he has been having on and off symptoms but this morning got worse.

## 2015-06-04 NOTE — Addendum Note (Signed)
Addended by: Beatris Ship L on: 06/04/2015 01:59 PM   Modules accepted: Orders

## 2015-06-05 DIAGNOSIS — K5732 Diverticulitis of large intestine without perforation or abscess without bleeding: Secondary | ICD-10-CM | POA: Diagnosis present

## 2015-06-05 LAB — PROTIME-INR
INR: 1.31 (ref 0.00–1.49)
PROTHROMBIN TIME: 16.4 s — AB (ref 11.6–15.2)

## 2015-06-05 LAB — GLUCOSE, CAPILLARY
GLUCOSE-CAPILLARY: 114 mg/dL — AB (ref 65–99)
GLUCOSE-CAPILLARY: 76 mg/dL (ref 65–99)
GLUCOSE-CAPILLARY: 106 mg/dL — AB (ref 65–99)
Glucose-Capillary: 90 mg/dL (ref 65–99)

## 2015-06-05 LAB — CBC
HCT: 40.6 % (ref 39.0–52.0)
HEMOGLOBIN: 13.1 g/dL (ref 13.0–17.0)
MCH: 28.7 pg (ref 26.0–34.0)
MCHC: 32.3 g/dL (ref 30.0–36.0)
MCV: 88.8 fL (ref 78.0–100.0)
PLATELETS: 219 10*3/uL (ref 150–400)
RBC: 4.57 MIL/uL (ref 4.22–5.81)
RDW: 13.2 % (ref 11.5–15.5)
WBC: 7 10*3/uL (ref 4.0–10.5)

## 2015-06-05 LAB — APTT: aPTT: 31 seconds (ref 24–37)

## 2015-06-05 LAB — LACTIC ACID, PLASMA
LACTIC ACID, VENOUS: 0.8 mmol/L (ref 0.5–2.0)
Lactic Acid, Venous: 0.7 mmol/L (ref 0.5–2.0)

## 2015-06-05 LAB — CREATININE, SERUM
CREATININE: 0.98 mg/dL (ref 0.61–1.24)
GFR calc Af Amer: >60 mL/min (ref >60)

## 2015-06-05 MED ORDER — DICLOFENAC SODIUM 1 % TD GEL
1.0000 "application " | Freq: Two times a day (BID) | TRANSDERMAL | Status: DC
  Administered 2015-06-05 – 2015-06-07 (×3): 1 via TOPICAL
  Filled 2015-06-05: qty 100

## 2015-06-05 MED ORDER — HYDROCODONE-ACETAMINOPHEN 5-325 MG PO TABS: 1.0000 | ORAL_TABLET | ORAL | Status: DC | PRN

## 2015-06-05 MED ORDER — INSULIN ASPART 100 UNIT/ML ~~LOC~~ SOLN
0.0000 [IU] | Freq: Three times a day (TID) | SUBCUTANEOUS | Status: DC
  Administered 2015-06-06: 1 [IU] via SUBCUTANEOUS

## 2015-06-05 MED ORDER — CIPROFLOXACIN IN D5W 400 MG/200ML IV SOLN
400.0000 mg | Freq: Two times a day (BID) | INTRAVENOUS | Status: DC
  Administered 2015-06-05 – 2015-06-07 (×5): 400 mg via INTRAVENOUS
  Filled 2015-06-05 (×7): qty 200

## 2015-06-05 MED ORDER — GLUCOSAMINE-CHONDROITIN 500-400 MG PO TABS: 1.0000 | ORAL_TABLET | Freq: Three times a day (TID) | ORAL | Status: DC

## 2015-06-05 MED ORDER — CALCIUM CARBONATE-VITAMIN D 500-200 MG-UNIT PO TABS
1.0000 | ORAL_TABLET | Freq: Two times a day (BID) | ORAL | Status: DC
  Filled 2015-06-05 (×3): qty 1

## 2015-06-05 MED ORDER — DEXTROSE-NACL 5-0.9 % IV SOLN
INTRAVENOUS | Status: DC
  Administered 2015-06-05 – 2015-06-06 (×2): via INTRAVENOUS

## 2015-06-05 MED ORDER — HEPARIN SODIUM (PORCINE) 5000 UNIT/ML IJ SOLN
5000.0000 [IU] | Freq: Three times a day (TID) | INTRAMUSCULAR | Status: DC
  Administered 2015-06-05 – 2015-06-07 (×6): 5000 [IU] via SUBCUTANEOUS
  Filled 2015-06-05 (×7): qty 1

## 2015-06-05 MED ORDER — ONDANSETRON HCL 4 MG/2ML IJ SOLN
4.0000 mg | Freq: Four times a day (QID) | INTRAMUSCULAR | Status: DC | PRN
  Administered 2015-06-06: 4 mg via INTRAVENOUS
  Filled 2015-06-05 (×2): qty 2

## 2015-06-05 MED ORDER — METRONIDAZOLE IN NACL 5-0.79 MG/ML-% IV SOLN
500.0000 mg | Freq: Three times a day (TID) | INTRAVENOUS | Status: DC
  Administered 2015-06-05 – 2015-06-07 (×8): 500 mg via INTRAVENOUS
  Filled 2015-06-05 (×10): qty 100

## 2015-06-05 MED ORDER — ACETAMINOPHEN 325 MG PO TABS: 650.0000 mg | ORAL_TABLET | Freq: Four times a day (QID) | ORAL | Status: DC | PRN

## 2015-06-05 MED ORDER — ACETAMINOPHEN 650 MG RE SUPP: 650.0000 mg | Freq: Four times a day (QID) | RECTAL | Status: DC | PRN

## 2015-06-05 NOTE — Progress Notes (Signed)
PATIENT DETAILS Name: Frederick Yang Age: 54 y.o. Sex: male Date of Birth: 01/10/1961 Admit Date: 06/04/2015 Admitting Physician Vianne Bulls, MD [id number], MD  Subjective: Pain better-still tender in LLQ  Assessment/Plan: Principal Problem: Sigmoid diverticulitis with localized perforation: Improving, soft abd with some mild tenderness in LLQ. Continue IV Cipro/Flagyl-keep NPO 24 hours before starting liquids. Gen Surg following. Colonoscopy 2014 +polyps.  Active Problems: Type 2 diabetes mellitus: CBG's table-continue SSI-,resume Metformin on discharge.   Chronic Low Back Pain:stable-good strength in lower ext.   Disposition: Remain inpatient  Antimicrobial agents  See below  Anti-infectives    Start     Dose/Rate Route Frequency Ordered Stop   06/05/15 0800  ciprofloxacin (CIPRO) IVPB 400 mg     400 mg 200 mL/hr over 60 Minutes Intravenous Every 12 hours 06/05/15 0126 06/15/15 0759   06/05/15 0200  metroNIDAZOLE (FLAGYL) IVPB 500 mg     500 mg 100 mL/hr over 60 Minutes Intravenous Every 8 hours 06/05/15 0126 06/15/15 0159   06/04/15 1645  ciprofloxacin (CIPRO) IVPB 400 mg     400 mg 200 mL/hr over 60 Minutes Intravenous  Once 06/04/15 1643 06/04/15 1754   06/04/15 1645  metroNIDAZOLE (FLAGYL) IVPB 500 mg     500 mg 100 mL/hr over 60 Minutes Intravenous  Once 06/04/15 1643 06/04/15 1908      DVT Prophylaxis: Prophylactic Heparin   Code Status: Full code   Family Communication None at bedside  Procedures: None  CONSULTS:  general surgery  Time spent 30 minutes-Greater than 50% of this time was spent in counseling, explanation of diagnosis, planning of further management, and coordination of care.  MEDICATIONS: Scheduled Meds: . calcium-vitamin D  1 tablet Oral BID WC  . ciprofloxacin  400 mg Intravenous Q12H  . diclofenac sodium  1 application Topical BID WC  . heparin  5,000 Units Subcutaneous 3 times per day  .  insulin aspart  0-9 Units Subcutaneous TID WC  . metronidazole  500 mg Intravenous Q8H   Continuous Infusions: . sodium chloride Stopped (06/04/15 1923)   PRN Meds:.acetaminophen **OR** acetaminophen, HYDROcodone-acetaminophen    PHYSICAL EXAM: Vital signs in last 24 hours: Filed Vitals:   06/04/15 1800 06/04/15 2001 06/04/15 2107 06/05/15 0524  BP: 102/53 108/57 116/59 97/55  Pulse: 83 76 76 71  Temp:   98.8 F (37.1 C) 99 F (37.2 C)  TempSrc:   Oral Oral  Resp: 14 20 18 19   Height:   5\' 7"  (1.702 m)   Weight:   132.541 kg (292 lb 3.2 oz)   SpO2: 98% 100% 97% 98%    Weight change:  Filed Weights   06/04/15 2107  Weight: 132.541 kg (292 lb 3.2 oz)   Body mass index is 45.75 kg/(m^2).   Gen Exam: Awake and alert with clear speech.   Neck: Supple, No JVD.   Chest: B/L Clear.   CVS: S1 S2 Regular, no murmurs.  Abdomen: soft, BS +,mildly tender LLQ, non distended.  Extremities: no edema, lower extremities warm to touch. Neurologic: Non Focal.   Skin: No Rash.   Wounds: N/A.   Intake/Output from previous day:  Intake/Output Summary (Last 24 hours) at 06/05/15 1111 Last data filed at 06/05/15 0839  Gross per 24 hour  Intake      0 ml  Output    800 ml  Net   -800 ml  LAB RESULTS: CBC  Recent Labs Lab 06/04/15 1445 06/05/15 0218  WBC 11.4* 7.0  HGB 14.6 13.1  HCT 44.8 40.6  PLT 268 219  MCV 87.3 88.8  MCH 28.5 28.7  MCHC 32.6 32.3  RDW 13.1 13.2  LYMPHSABS 1.5  --   MONOABS 0.8  --   EOSABS 0.1  --   BASOSABS 0.0  --     Chemistries   Recent Labs Lab 06/04/15 1445 06/05/15 0218  NA 134*  --   K 4.0  --   CL 102  --   CO2 25  --   GLUCOSE 102*  --   BUN 10  --   CREATININE 0.84 0.98  CALCIUM 9.2  --     CBG:  Recent Labs Lab 06/04/15 2105 06/05/15 0756  GLUCAP 107* 114*    GFR Estimated Creatinine Clearance: 115.6 mL/min (by C-G formula based on Cr of 0.98).  Coagulation profile  Recent Labs Lab 06/05/15 0218  INR  1.31    Cardiac Enzymes No results for input(s): CKMB, TROPONINI, MYOGLOBIN in the last 168 hours.  Invalid input(s): CK  Invalid input(s): POCBNP No results for input(s): DDIMER in the last 72 hours. No results for input(s): HGBA1C in the last 72 hours. No results for input(s): CHOL, HDL, LDLCALC, TRIG, CHOLHDL, LDLDIRECT in the last 72 hours. No results for input(s): TSH, T4TOTAL, T3FREE, THYROIDAB in the last 72 hours.  Invalid input(s): FREET3 No results for input(s): VITAMINB12, FOLATE, FERRITIN, TIBC, IRON, RETICCTPCT in the last 72 hours.  Recent Labs  06/04/15 1445  LIPASE 24    Urine Studies No results for input(s): UHGB, CRYS in the last 72 hours.  Invalid input(s): UACOL, UAPR, USPG, UPH, UTP, UGL, UKET, UBIL, UNIT, UROB, ULEU, UEPI, UWBC, URBC, UBAC, CAST, UCOM, BILUA  MICROBIOLOGY: Recent Results (from the past 240 hour(s))  Culture, blood (routine x 2)     Status: None (Preliminary result)   Collection Time: 06/04/15  3:50 PM  Result Value Ref Range Status   Specimen Description BLOOD LEFT ANTECUBITAL  Final   Special Requests BOTTLES DRAWN AEROBIC AND ANAEROBIC 10CC EACH  Final   Culture PENDING  Incomplete   Report Status PENDING  Incomplete  Culture, blood (routine x 2)     Status: None (Preliminary result)   Collection Time: 06/04/15  3:55 PM  Result Value Ref Range Status   Specimen Description BLOOD RIGHT ANTECUBITAL  Final   Special Requests   Final    BOTTLES DRAWN AEROBIC AND ANAEROBIC [place]   Culture PENDING  Incomplete   Report Status PENDING  Incomplete    RADIOLOGY STUDIES/RESULTS: Dg Chest 2 View  06/04/2015  CLINICAL DATA:  Nausea and abdominal pain EXAM: CHEST - 2 VIEW COMPARISON:  None. FINDINGS: The heart size and mediastinal contours are within normal limits. Both lungs are clear. The visualized skeletal structures are unremarkable. IMPRESSION: No active disease. Electronically Signed   By: Inez Catalina M.D.   On:  06/04/2015 16:42   Ct Abdomen Pelvis W Contrast  06/04/2015  CLINICAL DATA:  54 year old male with prostatitis November 2016. Groin pain. Blood pressure dropping. Nausea. Hyperlipidemia. Diabetes. Post appendectomy. Initial encounter. EXAM: CT ABDOMEN AND PELVIS WITH CONTRAST TECHNIQUE: Multidetector CT imaging of the abdomen and pelvis was performed using the standard protocol following bolus administration of intravenous contrast. CONTRAST:  172mL OMNIPAQUE IOHEXOL 300 MG/ML SOLN, 52mL OMNIPAQUE IOHEXOL 300 MG/ML SOLN COMPARISON:  None. FINDINGS: Sigmoid mesentery loculated free air  with surrounding inflammation consistent with sigmoid colon diverticulitis without drainable abscess. Fatty infiltration of the liver. No worrisome hepatic, splenic, pancreatic, renal or adrenal lesion. 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 Lung bases clear.  Heart top-normal in size. No abdominal aortic aneurysm.  No adenopathy. No calcified gallstones. Degenerative changes lower lumbar spine without osseous destructive lesion. Noncontrast filled views the urinary bladder unremarkable. Prostate gland top-normal in size. IMPRESSION: Sigmoid mesentery loculated free air with surrounding inflammation consistent with sigmoid colon diverticulitis without drainable abscess. Fatty infiltration of the liver. 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 Degenerative changes lower lumbar spine without osseous destructive lesion. Prostate gland top-normal in size. These results were called by telephone at the time of interpretation on 06/04/2015 at 4:41 pm to Dr. Fredia Sorrow , who verbally acknowledged these results. Electronically Signed   By: Genia Del M.D.   On: 06/04/2015 16:46    Oren Binet, MD  [facility] Pager:[phone]  If 7PM-7AM, please contact night-coverage [email or web address] Password [email or web address] 06/05/2015, 11:11 AM   LOS: 1 day

## 2015-06-05 NOTE — Progress Notes (Signed)
Informed Dr Sloan Leiter of CBG 76. Patient asymptomatic.

## 2015-06-05 NOTE — Progress Notes (Signed)
Doing fine.  Minimal tenderness.  Probably keep NPO until tomorrow or this evening.  Frederick Yang. [name], MD, [place] [phone] [facility] Surgery

## 2015-06-05 NOTE — Progress Notes (Signed)
PHARMACIST - PHYSICIAN ORDER COMMUNICATION  CONCERNING: P&T Medication Policy on Herbal Medications  DESCRIPTION:  This patient's order for:  Glucosamine-Chondroitin  has been noted.  This product(s) is classified as an "herbal" or natural product. Due to a lack of definitive safety studies or FDA approval, nonstandard manufacturing practices, plus the potential risk of unknown drug-drug interactions while on inpatient medications, the Pharmacy and Therapeutics Committee does not permit the use of "herbal" or natural products of this type within [place].   ACTION TAKEN: The pharmacy department is unable to verify this order at this time and your patient has been informed of this safety policy. Please reevaluate patient's clinical condition at discharge and address if the herbal or natural product(s) should be resumed at that time.   

## 2015-06-05 NOTE — Consult Note (Signed)
Reason for Consult: abdominal pain Referring Physician: Dr. [name] is an 54 y.o. male.  HPI: 54 yo male with 3 weeks of worsening abdominal pain. Initially, he had lower abdominal pain, lightheadedness and was treated for presumed prostatitis with antibiotics. He slowly improved until yesterday when he again had worse pain, nausea, and fatigue. He has not had similar pains before. He had a colonoscopy 2 years ago with 9 polyps removed and is to get a repeat next year. He has not smoked in >20years.   Past Medical History  Diagnosis Date  . Prostate hypertrophy   . Hyperlipidemia   . GERD (gastroesophageal reflux disease)   . Diabetes mellitus without complication [facility])     Past Surgical History  Procedure Laterality Date  . Hernia repair      RT groin  . Appendectomy    . Treatment fistula anal    . Hemorrhoid banding  06/24/13    Dr. Ludwig Yang  . Hemorrhoid banding  07/12/2013  . Right shoulder surgery  03/06/2014    Dr. Tamera Yang     Family History  Problem Relation Age of Onset  . Breast cancer Mother 81  . Alcohol abuse Maternal Uncle   . Heart attack Maternal Uncle   . Stroke Maternal Uncle   . Lung cancer Maternal Grandfather   . Diabetes Maternal Aunt   . Hypertension Brother   . Hypertension Sister   . Diabetes Sister   . Uterine cancer Mother     Social History:  reports that he quit smoking about 26 years ago. His smoking use included Cigarettes. He has never used smokeless tobacco. He reports that he drinks alcohol. He reports that he does not use illicit drugs.  Allergies: No Known Allergies  Medications: I have reviewed the patient's current medications.  Results for orders placed or performed during the hospital encounter of 06/04/15 (from the past 48 hour(s))  Comprehensive metabolic panel     Status: Abnormal   Collection Time: 06/04/15  2:45 PM  Result Value Ref Range   Sodium 134 (L) 135 - 145 mmol/L   Potassium 4.0 3.5 - 5.1 mmol/L    Chloride 102 101 - 111 mmol/L   CO2 25 22 - 32 mmol/L   Glucose, Bld 102 (H) 65 - 99 mg/dL   BUN 10 6 - 20 mg/dL   Creatinine, Ser 0.84 0.61 - 1.24 mg/dL   Calcium 9.2 8.9 - 10.3 mg/dL   Total Protein 8.6 (H) 6.5 - 8.1 g/dL   Albumin 4.0 3.5 - 5.0 g/dL   AST 25 15 - 41 U/L   ALT 30 17 - 63 U/L   Alkaline Phosphatase 78 38 - 126 U/L   Total Bilirubin 0.9 0.3 - 1.2 mg/dL   GFR calc non Af Amer >60 >60 mL/min   GFR calc Af Amer >60 >60 mL/min    Comment: (NOTE) The eGFR has been calculated using the CKD EPI equation. This calculation has not been validated in all clinical situations. eGFR's persistently <60 mL/min signify possible Chronic Kidney Disease.    Anion gap 7 5 - 15  CBC with Differential/Platelet     Status: Abnormal   Collection Time: 06/04/15  2:45 PM  Result Value Ref Range   WBC 11.4 (H) 4.0 - 10.5 K/uL   RBC 5.13 4.22 - 5.81 MIL/uL   Hemoglobin 14.6 13.0 - 17.0 g/dL   HCT 44.8 39.0 - 52.0 %   MCV 87.3 78.0 - 100.0  fL   MCH 28.5 26.0 - 34.0 pg   MCHC 32.6 30.0 - 36.0 g/dL   RDW 13.1 11.5 - 15.5 %   Platelets 268 150 - 400 K/uL   Neutrophils Relative % 79 %   Neutro Abs 9.1 (H) 1.7 - 7.7 K/uL   Lymphocytes Relative 13 %   Lymphs Abs 1.5 0.7 - 4.0 K/uL   Monocytes Relative 7 %   Monocytes Absolute 0.8 0.1 - 1.0 K/uL   Eosinophils Relative 1 %   Eosinophils Absolute 0.1 0.0 - 0.7 K/uL   Basophils Relative 0 %   Basophils Absolute 0.0 0.0 - 0.1 K/uL  Lipase, blood     Status: None   Collection Time: 06/04/15  2:45 PM  Result Value Ref Range   Lipase 24 11 - 51 U/L  Urinalysis, Routine w reflex microscopic (not at [facility])     Status: Abnormal   Collection Time: 06/04/15  3:06 PM  Result Value Ref Range   Color, Urine YELLOW YELLOW   APPearance CLEAR CLEAR   Specific Gravity, Urine 1.012 1.005 - 1.030   pH 5.5 5.0 - 8.0   Glucose, UA NEGATIVE NEGATIVE mg/dL   Hgb urine dipstick NEGATIVE NEGATIVE   Bilirubin Urine NEGATIVE NEGATIVE   Ketones, ur 15 (A)  NEGATIVE mg/dL   Protein, ur NEGATIVE NEGATIVE mg/dL   Nitrite NEGATIVE NEGATIVE   Leukocytes, UA NEGATIVE NEGATIVE    Comment: MICROSCOPIC NOT DONE ON URINES WITH NEGATIVE PROTEIN, BLOOD, LEUKOCYTES, NITRITE, OR GLUCOSE <1000 mg/dL.  Culture, blood (routine x 2)     Status: None (Preliminary result)   Collection Time: 06/04/15  3:50 PM  Result Value Ref Range   Specimen Description BLOOD LEFT ANTECUBITAL    Special Requests BOTTLES DRAWN AEROBIC AND ANAEROBIC 10CC EACH    Culture PENDING    Report Status PENDING   I-Stat CG4 Lactic Acid, ED     Status: None   Collection Time: 06/04/15  3:50 PM  Result Value Ref Range   Lactic Acid, Venous 1.89 0.5 - 2.0 mmol/L  Culture, blood (routine x 2)     Status: None (Preliminary result)   Collection Time: 06/04/15  3:55 PM  Result Value Ref Range   Specimen Description BLOOD RIGHT ANTECUBITAL    Special Requests      BOTTLES DRAWN AEROBIC AND ANAEROBIC [place] RED 10CC BLUE   Culture PENDING    Report Status PENDING   Glucose, capillary     Status: Abnormal   Collection Time: 06/04/15  9:05 PM  Result Value Ref Range   Glucose-Capillary 107 (H) 65 - 99 mg/dL  CBC     Status: None   Collection Time: 06/05/15  2:18 AM  Result Value Ref Range   WBC 7.0 4.0 - 10.5 K/uL   RBC 4.57 4.22 - 5.81 MIL/uL   Hemoglobin 13.1 13.0 - 17.0 g/dL   HCT 40.6 39.0 - 52.0 %   MCV 88.8 78.0 - 100.0 fL   MCH 28.7 26.0 - 34.0 pg   MCHC 32.3 30.0 - 36.0 g/dL   RDW 13.2 11.5 - 15.5 %   Platelets 219 150 - 400 K/uL  Creatinine, serum     Status: None   Collection Time: 06/05/15  2:18 AM  Result Value Ref Range   Creatinine, Ser 0.98 0.61 - 1.24 mg/dL   GFR calc non Af Amer >60 >60 mL/min   GFR calc Af Amer >60 >60 mL/min    Comment: (NOTE) The eGFR has  been calculated using the CKD EPI equation. This calculation has not been validated in all clinical situations. eGFR's persistently <60 mL/min signify possible Chronic Kidney Disease.   Protime-INR      Status: Abnormal   Collection Time: 06/05/15  2:18 AM  Result Value Ref Range   Prothrombin Time 16.4 (H) 11.6 - 15.2 seconds   INR 1.31 0.00 - 1.49  APTT     Status: None   Collection Time: 06/05/15  2:18 AM  Result Value Ref Range   aPTT 31 24 - 37 seconds  Lactic acid, plasma     Status: None   Collection Time: 06/05/15  2:18 AM  Result Value Ref Range   Lactic Acid, Venous 0.7 0.5 - 2.0 mmol/L    Dg Chest 2 View  06/04/2015  CLINICAL DATA:  Nausea and abdominal pain EXAM: CHEST - 2 VIEW COMPARISON:  None. FINDINGS: The heart size and mediastinal contours are within normal limits. Both lungs are clear. The visualized skeletal structures are unremarkable. IMPRESSION: No active disease. Electronically Signed   By: Inez Catalina M.D.   On: 06/04/2015 16:42   Ct Abdomen Pelvis W Contrast  06/04/2015  CLINICAL DATA:  54 year old male with prostatitis November 2016. Groin pain. Blood pressure dropping. Nausea. Hyperlipidemia. Diabetes. Post appendectomy. Initial encounter. EXAM: CT ABDOMEN AND PELVIS WITH CONTRAST TECHNIQUE: Multidetector CT imaging of the abdomen and pelvis was performed using the standard protocol following bolus administration of intravenous contrast. CONTRAST:  168m OMNIPAQUE IOHEXOL 300 MG/ML SOLN, 242mOMNIPAQUE IOHEXOL 300 MG/ML SOLN COMPARISON:  None. FINDINGS: Sigmoid mesentery loculated free air with surrounding inflammation consistent with sigmoid colon diverticulitis without drainable abscess. Fatty infiltration of the liver. No worrisome hepatic, splenic, pancreatic, renal or adrenal lesion. 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 Lung bases clear.  Heart top-normal in size. No abdominal aortic aneurysm.  No adenopathy. No calcified gallstones. Degenerative changes lower lumbar spine without osseous destructive lesion. Noncontrast filled views the urinary bladder unremarkable. Prostate gland top-normal in size. IMPRESSION: Sigmoid mesentery loculated free air  with surrounding inflammation consistent with sigmoid colon diverticulitis without drainable abscess. Fatty infiltration of the liver. 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 Degenerative changes lower lumbar spine without osseous destructive lesion. Prostate gland top-normal in size. These results were called by telephone at the time of interpretation on 06/04/2015 at 4:41 pm to Dr. SCFredia Sorrow who verbally acknowledged these results. Electronically Signed   By: StGenia Del.D.   On: 06/04/2015 16:46    Review of Systems  Constitutional: Negative for fever and chills.  HENT: Negative for hearing loss.   Eyes: Negative for blurred vision and double vision.  Respiratory: Negative for cough and hemoptysis.   Cardiovascular: Negative for chest pain and palpitations.  Gastrointestinal: Positive for abdominal pain. Negative for heartburn, vomiting, diarrhea and constipation.  Genitourinary: Negative for dysuria and urgency.  Musculoskeletal: Negative for myalgias and neck pain.  Skin: Negative for itching and rash.  Neurological: Negative for dizziness, tingling and headaches.  Endo/Heme/Allergies: Negative for environmental allergies. Does not bruise/bleed easily.  Psychiatric/Behavioral: Negative for hallucinations. The patient is not nervous/anxious.    Blood pressure 116/59, pulse 76, temperature 98.8 F (37.1 C), temperature source Oral, resp. rate 18, height 5' 7"  (1.702 m), weight 132.541 kg (292 lb 3.2 oz), SpO2 97 %. Physical Exam  Constitutional: He is oriented to person, place, and time. He appears well-developed and well-nourished.  HENT:  Head: Normocephalic and atraumatic.  Eyes: Conjunctivae and EOM  are normal.  Neck: Normal range of motion. Neck supple.  Cardiovascular: Normal rate and regular rhythm.   Respiratory: Effort normal and breath sounds normal. No respiratory distress. He has no wheezes. He has no rales.  GI: Soft. There is no tenderness. There is no  rebound and no guarding.  Musculoskeletal: Normal range of motion. He exhibits no edema.  Neurological: He is alert and oriented to person, place, and time.  Skin: Skin is warm and dry.  Psychiatric: He has a normal mood and affect. His behavior is normal.    Assessment/Plan: 54 yo male with sigmoid diverticulitis with localized perforation. His tenderness is minimal and his WBC has now improved to normal range. I agree with the primary team that antibiotics and slowly advancing back his diet is best treatment for him. We did discuss the etiology of diverticulitis, that it can be a recurrent disease, that colonoscopy follow up is recommended, and that recurrent or more severe disease may warrant partial colectomy. -continue antibiotics -slowly advance diet  Arta Bruce Frederick Yang 06/05/2015, 4:56 AM

## 2015-06-05 NOTE — H&P (Addendum)
[facility] History and Physical  Frederick Yang [id number] DOB: 1961-01-12 DOA: 06/04/2015  Referring physician: ED physician PCP: Frederick Lecher, MD  Specialists: Frederick Yang of surgery consulting   Chief Complaint: Diverticulitis  HPI: Frederick Yang is a 54 y.o. male with PMH of hyperlipidemia, uncontrolled type 2 diabetes, and chronic low back pain with radiculopathy who presents in transfer from [facility] with abdominal pain, chills, low-grade temp, and hypotension with abdominal CT suggestive of sigmoid diverticulitis. Frederick Yang had very similar symptoms 3 weeks ago and was treated with a course of Bactrim by his primary care physician for suspected prostatitis. He had some initial improvement in telemetry abdominal pain returned in the last 2 days. He went to work on the morning of his presentation but shortly after arrival developed severe pain, chills, and lightheadedness. He was evaluated by an RN at his workplace who documented blood pressure 93/65 with heart rate of 105 and oral temp of 99.12F. He was directed to the closest emergency department which was at [facility]. There, CT of the abdomen and pelvis demonstrated loculated air in the sigmoid mesentery with surrounding inflammation consistent with sigmoid diverticulitis. He received IV fluids and and IV Cipro and Flagyl. Those blood pressure was 84/59 upon his presentation to the outside ED, it improved significantly with IV fluids and has remained stable. Surgery was consulted by the ED physician has very kindly agreed to see the patient.  Upon his arrival to our institution, patient is afebrile with no tachycardia or hypotension and pain is improved.  Where does patient live?   At home     Can patient participate in ADLs?  Yes     Review of Systems:   General: no fevers,  But chills, no sweats, weight change, poor appetite, or fatigue HEENT: no blurry vision, hearing changes or sore throat Pulm: no dyspnea, cough, or  wheeze CV: no chest pain or palpitations Abd:  nausea, but no vomiting.  abdominal pain, but no diarrhea, or constipation GU: no dysuria, hematuria, increased urinary frequency, or urgency  Ext: no leg edema Neuro: no focal weakness, numbness, or tingling, no vision change or hearing loss Skin: no rash, no wounds MSK: No muscle spasm, no deformity, no red, hot, or swollen joint Heme: No easy bruising or bleeding Travel history: No recent long distant travel    Allergy: No Known Allergies  Past Medical History  Diagnosis Date  . Prostate hypertrophy   . Hyperlipidemia   . GERD (gastroesophageal reflux disease)   . Diabetes mellitus without complication [facility])     Past Surgical History  Procedure Laterality Date  . Hernia repair      RT groin  . Appendectomy    . Treatment fistula anal    . Hemorrhoid banding  06/24/13    Dr. Ludwig Yang  . Hemorrhoid banding  07/12/2013  . Right shoulder surgery  03/06/2014    Dr. Tamera Yang     Social History:  reports that he quit smoking about 26 years ago. His smoking use included Cigarettes. He has never used smokeless tobacco. He reports that he drinks alcohol. He reports that he does not use illicit drugs.  Family History:  Family History  Problem Relation Age of Onset  . Breast cancer Mother 71  . Alcohol abuse Maternal Uncle   . Heart attack Maternal Uncle   . Stroke Maternal Uncle   . Lung cancer Maternal Grandfather   . Diabetes Maternal Aunt   . Hypertension Brother   .  Hypertension Sister   . Diabetes Sister   . Uterine cancer Mother      Prior to Admission medications   Medication Sig Start Date End Date Taking? Authorizing Provider  AMBULATORY NON FORMULARY MEDICATION Medication Name: glucometer, lancets and strips to test once a day.   Dx: diabetes, uncontrolled, type 2 ICD 10: 250.00 05/29/15   Frederick Marry, MD  APPLE CIDER VINEGAR PO Take 30 mLs by mouth daily.    Historical Provider, MD   Calcium-Magnesium-Vitamin D (CALCIUM 500 PO) Take 500 mg by mouth 2 (two) times daily.    Historical Provider, MD  Cinnamon 500 MG capsule Take 2,000 mg by mouth daily.    Historical Provider, MD  Diclofenac Sodium 2 % SOLN Place 2 sprays onto the skin 2 (two) times daily. 12/01/14   Frederick Decamp, MD  FIBER PO Take 9 g by mouth 2 (two) times daily.    Historical Provider, MD  fish oil-omega-3 fatty acids 1000 MG capsule Take 3,000 g by mouth daily.    Historical Provider, MD  Garlic 123XX123 MG CAPS Take 1,000 mg by mouth.    Historical Provider, MD  glucosamine-chondroitin 500-400 MG tablet Take 1 tablet by mouth 3 (three) times daily.    Historical Provider, MD  HONEY PO Take 30 mLs by mouth daily.    Historical Provider, MD  Magnesium 500 MG CAPS Take 500 mg by mouth.    Historical Provider, MD  metFORMIN (GLUCOPHAGE) 500 MG tablet Take 1 tablet (500 mg total) by mouth 2 (two) times daily with a meal. 05/28/15   Frederick Marry, MD  Multiple Vitamins-Minerals (MULTIVITAMIN WITH MINERALS) tablet Take 1 tablet by mouth daily.    Historical Provider, MD  niacin 500 MG tablet Take 500 mg by mouth daily with breakfast.    Historical Provider, MD  sulfamethoxazole-trimethoprim (BACTRIM DS,SEPTRA DS) 800-160 MG tablet Take 1 tablet by mouth 2 (two) times daily. 06/02/15   Frederick Marry, MD    Physical Exam: Filed Vitals:   06/04/15 1730 06/04/15 1800 06/04/15 2001 06/04/15 2107  BP: 120/79 102/53 108/57 116/59  Pulse: 86 83 76 76  Temp:    98.8 F (37.1 C)  TempSrc:    Oral  Resp:  14 20 18   Height:    5\' 7"  (1.702 m)  Weight:    132.541 kg (292 lb 3.2 oz)  SpO2: 97% 98% 100% 97%   General: Not in acute distress HEENT:       Eyes: PERRL, EOMI, no scleral icterus or conjunctival pallor.       ENT: No discharge from the ears or nose, no pharyngeal ulcers, petechiae or exudate, no tonsillar enlargement.        Neck: No JVD, no bruit, no appreciable mass Heme: No cervical  adenopathy, no pallor Cardiac: S1/S2, RRR, No murmurs, No gallops or rubs. Pulm: Good air movement bilaterally. No rales, wheezing, rhonchi or rubs. Abd: Soft, nondistended, mild tenderness in lower quadrants, L>R, but no rebound pain or gaurding, no mass or organomegaly, BS present. Ext: No LE edema bilaterally. 2+DP/PT pulse bilaterally. Musculoskeletal: No gross deformity, no red, hot, swollen joints, no limitation in ROM  Skin: No rashes or wounds on exposed surfaces  Neuro: Alert, oriented X3,  no focal findings Psych: Patient is not overtly psychotic.  Labs on Admission:  Basic Metabolic Panel:  Recent Labs Lab 06/04/15 1445  NA 134*  K 4.0  CL 102  CO2 25  GLUCOSE 102*  BUN 10  CREATININE 0.84  CALCIUM 9.2   Liver Function Tests:  Recent Labs Lab 06/04/15 1445  AST 25  ALT 30  ALKPHOS 78  BILITOT 0.9  PROT 8.6*  ALBUMIN 4.0    Recent Labs Lab 06/04/15 1445  LIPASE 24   No results for input(s): AMMONIA in the last 168 hours. CBC:  Recent Labs Lab 06/04/15 1445  WBC 11.4*  NEUTROABS 9.1*  HGB 14.6  HCT 44.8  MCV 87.3  PLT 268   Cardiac Enzymes: No results for input(s): CKTOTAL, CKMB, CKMBINDEX, TROPONINI in the last 168 hours.  BNP (last 3 results) No results for input(s): BNP in the last 8760 hours.  ProBNP (last 3 results) No results for input(s): PROBNP in the last 8760 hours.  CBG:  Recent Labs Lab 06/04/15 2105  GLUCAP 107*    Radiological Exams on Admission: Dg Chest 2 View  06/04/2015  CLINICAL DATA:  Nausea and abdominal pain EXAM: CHEST - 2 VIEW COMPARISON:  None. FINDINGS: The heart size and mediastinal contours are within normal limits. Both lungs are clear. The visualized skeletal structures are unremarkable. IMPRESSION: No active disease. Electronically Signed   By: Inez Catalina M.D.   On: 06/04/2015 16:42   Ct Abdomen Pelvis W Contrast  06/04/2015  CLINICAL DATA:  54 year old male with prostatitis November 2016. Groin  pain. Blood pressure dropping. Nausea. Hyperlipidemia. Diabetes. Post appendectomy. Initial encounter. EXAM: CT ABDOMEN AND PELVIS WITH CONTRAST TECHNIQUE: Multidetector CT imaging of the abdomen and pelvis was performed using the standard protocol following bolus administration of intravenous contrast. CONTRAST:  165mL OMNIPAQUE IOHEXOL 300 MG/ML SOLN, 10mL OMNIPAQUE IOHEXOL 300 MG/ML SOLN COMPARISON:  None. FINDINGS: Sigmoid mesentery loculated free air with surrounding inflammation consistent with sigmoid colon diverticulitis without drainable abscess. Fatty infiltration of the liver. No worrisome hepatic, splenic, pancreatic, renal or adrenal lesion. 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 Lung bases clear.  Heart top-normal in size. No abdominal aortic aneurysm.  No adenopathy. No calcified gallstones. Degenerative changes lower lumbar spine without osseous destructive lesion. Noncontrast filled views the urinary bladder unremarkable. Prostate gland top-normal in size. IMPRESSION: Sigmoid mesentery loculated free air with surrounding inflammation consistent with sigmoid colon diverticulitis without drainable abscess. Fatty infiltration of the liver. 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 Degenerative changes lower lumbar spine without osseous destructive lesion. Prostate gland top-normal in size. These results were called by telephone at the time of interpretation on 06/04/2015 at 4:41 pm to Dr. Fredia Sorrow , who verbally acknowledged these results. Electronically Signed   By: Genia Del M.D.   On: 06/04/2015 16:46    EKG:  Not done in ED, will obtain as appropriate   Assessment/Plan 1. Sigmoid diverticulitis - Patient nontoxic and without systemic symptoms on arrival to our institution - Will be continued on IV Cipro and Flagyl empirically - We'll be kept nothing by mouth until evaluated by surgery - Pain will be controlled with IV agents as needed  2. Type 2 diabetes,  uncontrolled - Patient has attempted, but failed treatment with lifestyle interventions and has been prescribed metformin last week but has not yet started it - Most recent A1c is 8.0% per patient's report, but not available in our EMR - Patient is currently nothing by mouth, and CBG will be performed every 4 hours with a sensitive sliding scale insulin for correction - Glucose monitoring will be discontinued if values remain below 120  DVT ppx: SQ Heparin    Code Status: Full  code Family Communication:  Yes, patient's s/o at bed side Disposition Plan: Admit to inpatient   Date of Service 06/05/2015    Ilene Qua Opyd [facility] Pager [phone]  If 7PM-7AM, please contact night-coverage [email or web address] Password [email or web address] 06/05/2015, 1:28 AM

## 2015-06-06 LAB — BASIC METABOLIC PANEL
Anion gap: 7 (ref 5–15)
BUN: 8 mg/dL (ref 6–20)
CALCIUM: 8.7 mg/dL — AB (ref 8.9–10.3)
CO2: 25 mmol/L (ref 22–32)
CREATININE: 0.88 mg/dL (ref 0.61–1.24)
Chloride: 103 mmol/L (ref 101–111)
GFR calc non Af Amer: >60 mL/min (ref >60)
Glucose, Bld: 120 mg/dL — ABNORMAL HIGH (ref 65–99)
Potassium: 4.4 mmol/L (ref 3.5–5.1)
SODIUM: 135 mmol/L (ref 135–145)

## 2015-06-06 LAB — GLUCOSE, CAPILLARY
GLUCOSE-CAPILLARY: 141 mg/dL — AB (ref 65–99)
GLUCOSE-CAPILLARY: 102 mg/dL — AB (ref 65–99)
GLUCOSE-CAPILLARY: 150 mg/dL — AB (ref 65–99)
Glucose-Capillary: 120 mg/dL — ABNORMAL HIGH (ref 65–99)

## 2015-06-06 LAB — CBC
HCT: 39.8 % (ref 39.0–52.0)
Hemoglobin: 12.6 g/dL — ABNORMAL LOW (ref 13.0–17.0)
MCH: 28.3 pg (ref 26.0–34.0)
MCHC: 31.7 g/dL (ref 30.0–36.0)
MCV: 89.2 fL (ref 78.0–100.0)
Platelets: 211 10*3/uL (ref 150–400)
RBC: 4.46 MIL/uL (ref 4.22–5.81)
RDW: 13.1 % (ref 11.5–15.5)
WBC: 3.9 10*3/uL — ABNORMAL LOW (ref 4.0–10.5)

## 2015-06-06 MED ORDER — SODIUM CHLORIDE 0.9 % IV SOLN
INTRAVENOUS | Status: DC
  Administered 2015-06-06: 23:00:00 via INTRAVENOUS

## 2015-06-06 NOTE — Progress Notes (Signed)
Patient ID: GOULD SNIDE, male   DOB: 10/22/1960, 54 y.o.   MRN: [id number]   LOS: 2 days   Subjective: Feels a bit better. Some LLQ cramping/pain. Nausea yesterday but none today. Some flatus.   Objective: Vital signs in last 24 hours: Temp:  [98.6 F (37 C)-99.1 F (37.3 C)] 98.6 F (37 C) (12/10 0502) Pulse Rate:  [64-71] 64 (12/10 0502) Resp:  [16-18] 18 (12/10 0502) BP: (104-118)/(44-70) 118/62 mmHg (12/10 0502) SpO2:  [96 %-99 %] 96 % (12/10 0502) Last BM Date: 06/04/15   Laboratory  CBC  Recent Labs  06/05/15 0218 06/06/15 0338  WBC 7.0 3.9*  HGB 13.1 12.6*  HCT 40.6 39.8  PLT 219 211   BMET  Recent Labs  06/04/15 1445 06/05/15 0218 06/06/15 0338  NA 134*  --  135  K 4.0  --  4.4  CL 102  --  103  CO2 25  --  25  GLUCOSE 102*  --  120*  BUN 10  --  8  CREATININE 0.84 0.98 0.88  CALCIUM 9.2  --  8.7*   CBG (last 3)   Recent Labs  06/05/15 1642 06/05/15 2116 06/06/15 0753  GLUCAP 76 106* 120*    Physical Exam General appearance: alert and no distress Resp: clear to auscultation bilaterally Cardio: regular rate and rhythm GI: Soft, diminished BS, mild TTP   Assessment/Plan: Diverticulitis w/perf -- Continue abx. Will give clears and monitor.    Lisette Abu, PA-C Pager: [phone] 06/06/2015

## 2015-06-06 NOTE — Progress Notes (Signed)
PATIENT DETAILS Name: Frederick Yang Age: 54 y.o. Sex: male Date of Birth: [birth year] Admit Date: 06/04/2015 Admitting Physician Vianne Bulls, MD [id number], MD  Subjective: LLQ pain improving  Assessment/Plan: Principal Problem: Sigmoid diverticulitis with localized perforation: Improving, soft abd with minimal tenderness in LLQ. Continue IV Cipro/Flagyl-starting clear liquids. Gen Surg following. Colonoscopy 2014 +polyps-will need outpatient GI follow up.  Active Problems: Type 2 diabetes mellitus: CBG's table-continue SSI-,resume Metformin on discharge.   Chronic Low Back Pain:stable-good strength in lower ext.   Disposition: Remain inpatient  Antimicrobial agents  See below  Anti-infectives    Start     Dose/Rate Route Frequency Ordered Stop   06/05/15 0800  ciprofloxacin (CIPRO) IVPB 400 mg     400 mg 200 mL/hr over 60 Minutes Intravenous Every 12 hours 06/05/15 0126 06/15/15 0759   06/05/15 0200  metroNIDAZOLE (FLAGYL) IVPB 500 mg     500 mg 100 mL/hr over 60 Minutes Intravenous Every 8 hours 06/05/15 0126 06/15/15 0159   06/04/15 1645  ciprofloxacin (CIPRO) IVPB 400 mg     400 mg 200 mL/hr over 60 Minutes Intravenous  Once 06/04/15 1643 06/04/15 1754   06/04/15 1645  metroNIDAZOLE (FLAGYL) IVPB 500 mg     500 mg 100 mL/hr over 60 Minutes Intravenous  Once 06/04/15 1643 06/04/15 1908      DVT Prophylaxis: Prophylactic Heparin   Code Status: Full code   Family Communication None at bedside  Procedures: None  CONSULTS:  general surgery  Time spent 30 minutes-Greater than 50% of this time was spent in counseling, explanation of diagnosis, planning of further management, and coordination of care.  MEDICATIONS: Scheduled Meds: . calcium-vitamin D  1 tablet Oral BID WC  . ciprofloxacin  400 mg Intravenous Q12H  . diclofenac sodium  1 application Topical BID WC  . heparin  5,000 Units Subcutaneous 3 times per day  .  insulin aspart  0-9 Units Subcutaneous TID WC  . metronidazole  500 mg Intravenous Q8H   Continuous Infusions: . dextrose 5 % and 0.9% NaCl 100 mL/hr at 06/06/15 0557   PRN Meds:.acetaminophen **OR** acetaminophen, HYDROcodone-acetaminophen, ondansetron (ZOFRAN) IV    PHYSICAL EXAM: Vital signs in last 24 hours: Filed Vitals:   06/05/15 0524 06/05/15 1414 06/05/15 2116 06/06/15 0502  BP: 97/55 104/44 112/70 118/62  Pulse: 71 69 71 64  Temp: 99 F (37.2 C) 99.1 F (37.3 C) 99 F (37.2 C) 98.6 F (37 C)  TempSrc: Oral Oral Oral Oral  Resp: 19 16 18 18   Height:      Weight:      SpO2: 98% 99% 96% 96%    Weight change:  Filed Weights   06/04/15 2107  Weight: 132.541 kg (292 lb 3.2 oz)   Body mass index is 45.75 kg/(m^2).   Gen Exam: Awake and alert with clear speech.   Neck: Supple, No JVD.   Chest: B/L Clear.   CVS: S1 S2 Regular, no murmurs.  Abdomen: soft, BS +,mildly tender LLQ, non distended.  Extremities: no edema, lower extremities warm to touch. Neurologic: Non Focal.   Skin: No Rash.   Wounds: N/A.   Intake/Output from previous day:  Intake/Output Summary (Last 24 hours) at 06/06/15 1201 Last data filed at 06/06/15 0548  Gross per 24 hour  Intake 2063.33 ml  Output    950 ml  Net 1113.33 ml     LAB RESULTS:  CBC  Recent Labs Lab 06/04/15 1445 06/05/15 0218 06/06/15 0338  WBC 11.4* 7.0 3.9*  HGB 14.6 13.1 12.6*  HCT 44.8 40.6 39.8  PLT 268 219 211  MCV 87.3 88.8 89.2  MCH 28.5 28.7 28.3  MCHC 32.6 32.3 31.7  RDW 13.1 13.2 13.1  LYMPHSABS 1.5  --   --   MONOABS 0.8  --   --   EOSABS 0.1  --   --   BASOSABS 0.0  --   --     Chemistries   Recent Labs Lab 06/04/15 1445 06/05/15 0218 06/06/15 0338  NA 134*  --  135  K 4.0  --  4.4  CL 102  --  103  CO2 25  --  25  GLUCOSE 102*  --  120*  BUN 10  --  8  CREATININE 0.84 0.98 0.88  CALCIUM 9.2  --  8.7*    CBG:  Recent Labs Lab 06/05/15 0756 06/05/15 1218 06/05/15 1642  06/05/15 2116 06/06/15 0753  GLUCAP 114* 90 76 106* 120*    GFR Estimated Creatinine Clearance: 128.8 mL/min (by C-G formula based on Cr of 0.88).  Coagulation profile  Recent Labs Lab 06/05/15 0218  INR 1.31    Cardiac Enzymes No results for input(s): CKMB, TROPONINI, MYOGLOBIN in the last 168 hours.  Invalid input(s): CK  Invalid input(s): POCBNP No results for input(s): DDIMER in the last 72 hours. No results for input(s): HGBA1C in the last 72 hours. No results for input(s): CHOL, HDL, LDLCALC, TRIG, CHOLHDL, LDLDIRECT in the last 72 hours. No results for input(s): TSH, T4TOTAL, T3FREE, THYROIDAB in the last 72 hours.  Invalid input(s): FREET3 No results for input(s): VITAMINB12, FOLATE, FERRITIN, TIBC, IRON, RETICCTPCT in the last 72 hours.  Recent Labs  06/04/15 1445  LIPASE 24    Urine Studies No results for input(s): UHGB, CRYS in the last 72 hours.  Invalid input(s): UACOL, UAPR, USPG, UPH, UTP, UGL, UKET, UBIL, UNIT, UROB, ULEU, UEPI, UWBC, URBC, UBAC, CAST, UCOM, BILUA  MICROBIOLOGY: Recent Results (from the past 240 hour(s))  Culture, blood (routine x 2)     Status: None (Preliminary result)   Collection Time: 06/04/15  3:50 PM  Result Value Ref Range Status   Specimen Description BLOOD LEFT ANTECUBITAL  Final   Special Requests BOTTLES DRAWN AEROBIC AND ANAEROBIC 10CC EACH  Final   Culture   Final    NO GROWTH 2 DAYS Performed at [facility]    Report Status PENDING  Incomplete  Culture, blood (routine x 2)     Status: None (Preliminary result)   Collection Time: 06/04/15  3:55 PM  Result Value Ref Range Status   Specimen Description BLOOD RIGHT ANTECUBITAL  Final   Special Requests   Final    BOTTLES DRAWN AEROBIC AND ANAEROBIC [place]   Culture   Final    NO GROWTH 2 DAYS Performed at [facility]    Report Status PENDING  Incomplete    RADIOLOGY STUDIES/RESULTS: Dg Chest 2 View  06/04/2015  CLINICAL DATA:   Nausea and abdominal pain EXAM: CHEST - 2 VIEW COMPARISON:  None. FINDINGS: The heart size and mediastinal contours are within normal limits. Both lungs are clear. The visualized skeletal structures are unremarkable. IMPRESSION: No active disease. Electronically Signed   By: Inez Catalina M.D.   On: 06/04/2015 16:42   Ct Abdomen Pelvis W Contrast  06/04/2015  CLINICAL DATA:  54 year old male with prostatitis  November 2016. Groin pain. Blood pressure dropping. Nausea. Hyperlipidemia. Diabetes. Post appendectomy. Initial encounter. EXAM: CT ABDOMEN AND PELVIS WITH CONTRAST TECHNIQUE: Multidetector CT imaging of the abdomen and pelvis was performed using the standard protocol following bolus administration of intravenous contrast. CONTRAST:  136mL OMNIPAQUE IOHEXOL 300 MG/ML SOLN, 60mL OMNIPAQUE IOHEXOL 300 MG/ML SOLN COMPARISON:  None. FINDINGS: Sigmoid mesentery loculated free air with surrounding inflammation consistent with sigmoid colon diverticulitis without drainable abscess. Fatty infiltration of the liver. No worrisome hepatic, splenic, pancreatic, renal or adrenal lesion. 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 Lung bases clear.  Heart top-normal in size. No abdominal aortic aneurysm.  No adenopathy. No calcified gallstones. Degenerative changes lower lumbar spine without osseous destructive lesion. Noncontrast filled views the urinary bladder unremarkable. Prostate gland top-normal in size. IMPRESSION: Sigmoid mesentery loculated free air with surrounding inflammation consistent with sigmoid colon diverticulitis without drainable abscess. Fatty infiltration of the liver. 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 Degenerative changes lower lumbar spine without osseous destructive lesion. Prostate gland top-normal in size. These results were called by telephone at the time of interpretation on 06/04/2015 at 4:41 pm to Dr. Fredia Sorrow , who verbally acknowledged these results. Electronically  Signed   By: Genia Del M.D.   On: 06/04/2015 16:46    Oren Binet, MD  [facility] Pager:[phone]  If 7PM-7AM, please contact night-coverage [email or web address] Password [email or web address] 06/06/2015, 12:01 PM   LOS: 2 days

## 2015-06-06 NOTE — Progress Notes (Signed)
MD paged again regarding iv fluids. Awaiting return call

## 2015-06-06 NOTE — Progress Notes (Signed)
Pt is asking for change in iv drip orders since he is now on a diet. He reckons the dextrose is leading to elevated blood sugars. MD paged and awaiting return call

## 2015-06-07 DIAGNOSIS — E1165 Type 2 diabetes mellitus with hyperglycemia: Secondary | ICD-10-CM

## 2015-06-07 DIAGNOSIS — K5732 Diverticulitis of large intestine without perforation or abscess without bleeding: Secondary | ICD-10-CM

## 2015-06-07 LAB — CBC
HEMATOCRIT: 42.8 % (ref 39.0–52.0)
HEMOGLOBIN: 13.8 g/dL (ref 13.0–17.0)
MCH: 28.9 pg (ref 26.0–34.0)
MCHC: 32.2 g/dL (ref 30.0–36.0)
MCV: 89.5 fL (ref 78.0–100.0)
Platelets: 248 10*3/uL (ref 150–400)
RBC: 4.78 MIL/uL (ref 4.22–5.81)
RDW: 13.1 % (ref 11.5–15.5)
WBC: 5.6 10*3/uL (ref 4.0–10.5)

## 2015-06-07 LAB — GLUCOSE, CAPILLARY
GLUCOSE-CAPILLARY: 93 mg/dL (ref 65–99)
GLUCOSE-CAPILLARY: 91 mg/dL (ref 65–99)
Glucose-Capillary: 107 mg/dL — ABNORMAL HIGH (ref 65–99)

## 2015-06-07 MED ORDER — CIPROFLOXACIN HCL 500 MG PO TABS
500.0000 mg | ORAL_TABLET | Freq: Two times a day (BID) | ORAL | Status: DC
  Administered 2015-06-07 – 2015-06-08 (×2): 500 mg via ORAL
  Filled 2015-06-07 (×2): qty 1

## 2015-06-07 MED ORDER — METRONIDAZOLE 500 MG PO TABS
500.0000 mg | ORAL_TABLET | Freq: Three times a day (TID) | ORAL | Status: DC
  Administered 2015-06-07 – 2015-06-08 (×4): 500 mg via ORAL
  Filled 2015-06-07 (×4): qty 1

## 2015-06-07 NOTE — Progress Notes (Signed)
  Subjective: Some nausea and cramping but doing overall well  Objective: Vital signs in last 24 hours: Temp:  [97.5 F (36.4 C)-98.6 F (37 C)] 98.6 F (37 C) (12/11 0531) Pulse Rate:  [70-74] 70 (12/11 0531) Resp:  [18] 18 (12/11 0531) BP: (92-110)/(52-58) 99/58 mmHg (12/11 0531) SpO2:  [97 %-100 %] 97 % (12/11 0531) Last BM Date: 06/04/15  Intake/Output from previous day: 12/10 0701 - 12/11 0700 In: 1380 [P.O.:380; I.V.:1000] Out: -  Intake/Output this shift: Total I/O In: -  Out: 300 [Urine:300]  abd soft nt/nd    Lab Results:   Recent Labs  06/06/15 0338 06/07/15 0500  WBC 3.9* 5.6  HGB 12.6* 13.8  HCT 39.8 42.8  PLT 211 248   BMET  Recent Labs  06/04/15 1445 06/05/15 0218 06/06/15 0338  NA 134*  --  135  K 4.0  --  4.4  CL 102  --  103  CO2 25  --  25  GLUCOSE 102*  --  120*  BUN 10  --  8  CREATININE 0.84 0.98 0.88  CALCIUM 9.2  --  8.7*   PT/INR  Recent Labs  06/05/15 0218  LABPROT 16.4*  INR 1.31   ABG No results for input(s): PHART, HCO3 in the last 72 hours.  Invalid input(s): PCO2, PO2  Studies/Results: No results found.  Anti-infectives: Anti-infectives    Start     Dose/Rate Route Frequency Ordered Stop   06/05/15 0800  ciprofloxacin (CIPRO) IVPB 400 mg     400 mg 200 mL/hr over 60 Minutes Intravenous Every 12 hours 06/05/15 0126 06/15/15 0759   06/05/15 0200  metroNIDAZOLE (FLAGYL) IVPB 500 mg     500 mg 100 mL/hr over 60 Minutes Intravenous Every 8 hours 06/05/15 0126 06/15/15 0159   06/04/15 1645  ciprofloxacin (CIPRO) IVPB 400 mg     400 mg 200 mL/hr over 60 Minutes Intravenous  Once 06/04/15 1643 06/04/15 1754   06/04/15 1645  metroNIDAZOLE (FLAGYL) IVPB 500 mg     500 mg 100 mL/hr over 60 Minutes Intravenous  Once 06/04/15 1643 06/04/15 1908      Assessment/Plan: Perforated diverticulitis,wbc nl, no fevers, exam nontender  Can change abx to po Will give full liquids Possible advance and home  tomorrow  [facility] 06/07/2015

## 2015-06-07 NOTE — Progress Notes (Signed)
PATIENT DETAILS Name: Frederick Yang Age: 54 y.o. Sex: male Date of Birth: Oct 07, 1960 Admit Date: 06/04/2015 Admitting Physician Vianne Bulls, MD [id number], MD  Subjective:  Pt in bed no headache, no chest pain SOB, mild lower abd pain.  Assessment/Plan:  Sigmoid diverticulitis with localized perforation: Improving, soft abd with minimal tenderness in LLQ. Continue IV Cipro/Flagyl-starting clear liquids. Gen Surg following. Colonoscopy 2014 +polyps-will need outpatient GI follow up. Likely DC in am if ok by [facility].  Chronic Low Back Pain:stable-good strength in lower ext.   Type 2 diabetes mellitus: CBG's table-continue SSI-,resume Metformin on discharge.   Lab Results  Component Value Date   HGBA1C 6.2 04/18/2014    CBG (last 3)   Recent Labs  06/06/15 1659 06/06/15 2112 06/07/15 0751  GLUCAP 102* 150* 107*      Disposition: Remain inpatient  Antimicrobial agents  See below  Anti-infectives    Start     Dose/Rate Route Frequency Ordered Stop   06/05/15 0800  ciprofloxacin (CIPRO) IVPB 400 mg     400 mg 200 mL/hr over 60 Minutes Intravenous Every 12 hours 06/05/15 0126 06/15/15 0759   06/05/15 0200  metroNIDAZOLE (FLAGYL) IVPB 500 mg     500 mg 100 mL/hr over 60 Minutes Intravenous Every 8 hours 06/05/15 0126 06/15/15 0159   06/04/15 1645  ciprofloxacin (CIPRO) IVPB 400 mg     400 mg 200 mL/hr over 60 Minutes Intravenous  Once 06/04/15 1643 06/04/15 1754   06/04/15 1645  metroNIDAZOLE (FLAGYL) IVPB 500 mg     500 mg 100 mL/hr over 60 Minutes Intravenous  Once 06/04/15 1643 06/04/15 1908      DVT Prophylaxis: Prophylactic Heparin   Code Status: Full code   Family Communication None at bedside  Procedures: None  CONSULTS:  general surgery  Time spent 30 minutes-Greater than 50% of this time was spent in counseling, explanation of diagnosis, planning of further management, and coordination of  care.  MEDICATIONS: Scheduled Meds: . calcium-vitamin D  1 tablet Oral BID WC  . ciprofloxacin  400 mg Intravenous Q12H  . diclofenac sodium  1 application Topical BID WC  . heparin  5,000 Units Subcutaneous 3 times per day  . insulin aspart  0-9 Units Subcutaneous TID WC  . metronidazole  500 mg Intravenous Q8H   Continuous Infusions:   PRN Meds:.acetaminophen **OR** [DISCONTINUED] acetaminophen, HYDROcodone-acetaminophen, ondansetron (ZOFRAN) IV    PHYSICAL EXAM:  Vital signs in last 24 hours:  Filed Vitals:   06/06/15 0502 06/06/15 1351 06/06/15 2159 06/07/15 0531  BP: 118/62 110/52 92/54 99/58   Pulse: 64 74 71 70  Temp: 98.6 F (37 C) 97.5 F (36.4 C) 98.5 F (36.9 C) 98.6 F (37 C)  TempSrc: Oral Axillary Oral Oral  Resp: 18 18 18 18   Height:      Weight:      SpO2: 96% 100% 97% 97%    Weight change:  Filed Weights   06/04/15 2107  Weight: 132.541 kg (292 lb 3.2 oz)   Body mass index is 45.75 kg/(m^2).   Gen Exam: Awake and alert with clear speech.   Neck: Supple, No JVD.   Chest: B/L Clear.   CVS: S1 S2 Regular, no murmurs.  Abdomen: soft, BS +, mildly tender LLQ, non distended.  Extremities: no edema, lower extremities warm to touch. Neurologic: Non Focal.   Skin: No Rash.   Wounds: N/A.  Intake/Output from previous day:  Intake/Output Summary (Last 24 hours) at 06/07/15 0924 Last data filed at 06/07/15 0843  Gross per 24 hour  Intake   1380 ml  Output    300 ml  Net   1080 ml     LAB RESULTS: CBC  Recent Labs Lab 06/04/15 1445 06/05/15 0218 06/06/15 0338 06/07/15 0500  WBC 11.4* 7.0 3.9* 5.6  HGB 14.6 13.1 12.6* 13.8  HCT 44.8 40.6 39.8 42.8  PLT 268 219 211 248  MCV 87.3 88.8 89.2 89.5  MCH 28.5 28.7 28.3 28.9  MCHC 32.6 32.3 31.7 32.2  RDW 13.1 13.2 13.1 13.1  LYMPHSABS 1.5  --   --   --   MONOABS 0.8  --   --   --   EOSABS 0.1  --   --   --   BASOSABS 0.0  --   --   --     Chemistries   Recent Labs Lab  06/04/15 1445 06/05/15 0218 06/06/15 0338  NA 134*  --  135  K 4.0  --  4.4  CL 102  --  103  CO2 25  --  25  GLUCOSE 102*  --  120*  BUN 10  --  8  CREATININE 0.84 0.98 0.88  CALCIUM 9.2  --  8.7*    CBG:  Recent Labs Lab 06/06/15 0753 06/06/15 1232 06/06/15 1659 06/06/15 2112 06/07/15 0751  GLUCAP 120* 141* 102* 150* 107*    GFR Estimated Creatinine Clearance: 128.8 mL/min (by C-G formula based on Cr of 0.88).  Coagulation profile  Recent Labs Lab 06/05/15 0218  INR 1.31    Cardiac Enzymes No results for input(s): CKMB, TROPONINI, MYOGLOBIN in the last 168 hours.  Invalid input(s): CK  Invalid input(s): POCBNP No results for input(s): DDIMER in the last 72 hours. No results for input(s): HGBA1C in the last 72 hours. No results for input(s): CHOL, HDL, LDLCALC, TRIG, CHOLHDL, LDLDIRECT in the last 72 hours. No results for input(s): TSH, T4TOTAL, T3FREE, THYROIDAB in the last 72 hours.  Invalid input(s): FREET3 No results for input(s): VITAMINB12, FOLATE, FERRITIN, TIBC, IRON, RETICCTPCT in the last 72 hours.  Recent Labs  06/04/15 1445  LIPASE 24    Urine Studies No results for input(s): UHGB, CRYS in the last 72 hours.  Invalid input(s): UACOL, UAPR, USPG, UPH, UTP, UGL, UKET, UBIL, UNIT, UROB, ULEU, UEPI, UWBC, URBC, UBAC, CAST, UCOM, BILUA  MICROBIOLOGY: Recent Results (from the past 240 hour(s))  Culture, blood (routine x 2)     Status: None (Preliminary result)   Collection Time: 06/04/15  3:50 PM  Result Value Ref Range Status   Specimen Description BLOOD LEFT ANTECUBITAL  Final   Special Requests BOTTLES DRAWN AEROBIC AND ANAEROBIC 10CC EACH  Final   Culture   Final    NO GROWTH 2 DAYS Performed at [facility]    Report Status PENDING  Incomplete  Culture, blood (routine x 2)     Status: None (Preliminary result)   Collection Time: 06/04/15  3:55 PM  Result Value Ref Range Status   Specimen Description BLOOD RIGHT  ANTECUBITAL  Final   Special Requests   Final    BOTTLES DRAWN AEROBIC AND ANAEROBIC [place]   Culture   Final    NO GROWTH 2 DAYS Performed at [facility]    Report Status PENDING  Incomplete    RADIOLOGY STUDIES/RESULTS: Dg Chest 2 View  06/04/2015  CLINICAL DATA:  Nausea and abdominal pain EXAM: CHEST - 2 VIEW COMPARISON:  None. FINDINGS: The heart size and mediastinal contours are within normal limits. Both lungs are clear. The visualized skeletal structures are unremarkable. IMPRESSION: No active disease. Electronically Signed   By: Inez Catalina M.D.   On: 06/04/2015 16:42   Ct Abdomen Pelvis W Contrast  06/04/2015  CLINICAL DATA:  54 year old male with prostatitis November 2016. Groin pain. Blood pressure dropping. Nausea. Hyperlipidemia. Diabetes. Post appendectomy. Initial encounter. EXAM: CT ABDOMEN AND PELVIS WITH CONTRAST TECHNIQUE: Multidetector CT imaging of the abdomen and pelvis was performed using the standard protocol following bolus administration of intravenous contrast. CONTRAST:  115mL OMNIPAQUE IOHEXOL 300 MG/ML SOLN, 21mL OMNIPAQUE IOHEXOL 300 MG/ML SOLN COMPARISON:  None. FINDINGS: Sigmoid mesentery loculated free air with surrounding inflammation consistent with sigmoid colon diverticulitis without drainable abscess. Fatty infiltration of the liver. No worrisome hepatic, splenic, pancreatic, renal or adrenal lesion. 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 Lung bases clear.  Heart top-normal in size. No abdominal aortic aneurysm.  No adenopathy. No calcified gallstones. Degenerative changes lower lumbar spine without osseous destructive lesion. Noncontrast filled views the urinary bladder unremarkable. Prostate gland top-normal in size. IMPRESSION: Sigmoid mesentery loculated free air with surrounding inflammation consistent with sigmoid colon diverticulitis without drainable abscess. Fatty infiltration of the liver. Tiny right renal angiomyolipoma may  be present. Degenerative changes lower lumbar spine without osseous destructive lesion. Prostate gland top-normal in size. These results were called by telephone at the time of interpretation on 06/04/2015 at 4:41 pm to Dr. Fredia Sorrow , who verbally acknowledged these results. Electronically Signed   By: Genia Del M.D.   On: 06/04/2015 16:46    [name], MD  [facility] Pager:[phone]  If 7PM-7AM, please contact night-coverage [email or web address] Password [email or web address] 06/07/2015, 9:24 AM   LOS: 3 days

## 2015-06-08 DIAGNOSIS — K5720 Diverticulitis of large intestine with perforation and abscess without bleeding: Secondary | ICD-10-CM | POA: Insufficient documentation

## 2015-06-08 LAB — GLUCOSE, CAPILLARY: GLUCOSE-CAPILLARY: 165 mg/dL — AB (ref 65–99)

## 2015-06-08 MED ORDER — ONDANSETRON HCL 4 MG PO TABS: 4.0000 mg | ORAL_TABLET | Freq: Three times a day (TID) | ORAL | Status: DC | PRN

## 2015-06-08 MED ORDER — METRONIDAZOLE 500 MG PO TABS: 500.0000 mg | ORAL_TABLET | Freq: Three times a day (TID) | ORAL | Status: DC

## 2015-06-08 MED ORDER — CIPROFLOXACIN HCL 500 MG PO TABS: 500.0000 mg | ORAL_TABLET | Freq: Two times a day (BID) | ORAL | Status: DC

## 2015-06-08 MED ORDER — HYDROCODONE-ACETAMINOPHEN 5-325 MG PO TABS: 1.0000 | ORAL_TABLET | Freq: Four times a day (QID) | ORAL | Status: DC | PRN

## 2015-06-08 MED ORDER — DOCUSATE SODIUM 100 MG PO CAPS: 200.0000 mg | ORAL_CAPSULE | Freq: Two times a day (BID) | ORAL | Status: DC | PRN

## 2015-06-08 NOTE — Discharge Summary (Addendum)
Frederick Yang, is a 54 y.o. male  DOB March 23, 1961  MRN [id number].  Admission date:  06/04/2015  Admitting Physician  Vianne Bulls, MD  Discharge Date:  06/08/2015   Primary MD  METHENEY,CATHERINE, MD  Recommendations for primary care physician for things to follow:   Follow general surgery and GI within 7-10 days. Check CBC and BMP in 1 week.   Admission Diagnosis  Diverticulitis of large intestine with perforation without bleeding [K57.20]   Discharge Diagnosis  Diverticulitis of large intestine with perforation without bleeding [K57.20]     Principal Problem:   Diverticulitis Active Problems:   Uncontrolled type 2 diabetes mellitus without complication, without long-term current use of insulin (HCC)   Diverticulitis of sigmoid colon   Diverticulitis of large intestine with perforation without bleeding      Past Medical History  Diagnosis Date  . Prostate hypertrophy   . Hyperlipidemia   . GERD (gastroesophageal reflux disease)   . Diabetes mellitus without complication [facility])     Past Surgical History  Procedure Laterality Date  . Hernia repair      RT groin  . Appendectomy    . Treatment fistula anal    . Hemorrhoid banding  06/24/13    Dr. Ludwig Lean  . Hemorrhoid banding  07/12/2013  . Right shoulder surgery  03/06/2014    Dr. Tamera Punt        HPI  from the history and physical done on the day of admission:   Frederick Yang is a 54 y.o. male with PMH of hyperlipidemia, uncontrolled type 2 diabetes, and chronic low back pain with radiculopathy who presents in transfer from [facility] with abdominal pain, chills, low-grade temp, and hypotension with abdominal CT suggestive of sigmoid diverticulitis. Mr. Pasierb had very similar symptoms 3 weeks ago and was treated with a course of Bactrim by his  primary care physician for suspected prostatitis. He had some initial improvement in telemetry abdominal pain returned in the last 2 days. He went to work on the morning of his presentation but shortly after arrival developed severe pain, chills, and lightheadedness. He was evaluated by an RN at his workplace who documented blood pressure 93/65 with heart rate of 105 and oral temp of 99.22F. He was directed to the closest emergency department which was at [facility]. There, CT of the abdomen and pelvis demonstrated loculated air in the sigmoid mesentery with surrounding inflammation consistent with sigmoid diverticulitis. He received IV fluids and and IV Cipro and Flagyl. Those blood pressure was 84/59 upon his presentation to the outside ED, it improved significantly with IV fluids and has remained stable. Surgery was consulted by the ED physician has very kindly agreed to see the patient.  Upon his arrival to our institution, patient is afebrile with no tachycardia or hypotension and pain is improved.     Hospital Course:     Sigmoid diverticulitis with localized perforation: Gen. surgery, treated conservatively with bowel rest, IV fluids and IV antibiotics. Has not tolerated initially liquid and  now full liquid diet for 48 hours, had BM, no pain or discomfort at this time. Cleared by general surgery for discharge. Will be placed on 10 more days of oral antibiotics with outpatient follow-up with GI and general surgery. Will eventually require a colonoscopy.   Chronic Low Back Pain:stable-good strength in lower ext. no acute issues.   Type 2 diabetes mellitus: Home regimen, PCP to monitor glycemic control and A1c.       Discharge Condition: Stable  Follow UP  Follow-up Information    Follow up with METHENEY,CATHERINE, MD. Schedule an appointment as soon as possible for a visit in 3 days.   Specialty:  Family Medicine   Contact information:   [id number] [place]  [address] [phone]       Follow up with Mickeal Skinner, MD. Schedule an appointment as soon as possible for a visit in 2 weeks.   Specialty:  General Surgery   Why:  For post-hospital follow up with the surgeon regarding your diverticulitis   Contact information:   [place] [address] [phone]       Follow up with Silvano Rusk, MD. Schedule an appointment as soon as possible for a visit in 4 weeks.   Specialty:  Gastroenterology   Why:  Colonoscopy after diverticulitis and microperforation   Contact information:   [address] [place] [address] [phone]        Consults obtained - Surgery  Diet and Activity recommendation: See Discharge Instructions below  Discharge Instructions           Discharge Instructions    Discharge instructions    Complete by:  As directed   Follow with Primary MD METHENEY,CATHERINE, MD in 2-3 days   Get CBC, CMP, 2 view Chest X ray checked  by Primary MD next visit.    Activity: As tolerated with Full fall precautions use walker/cane & assistance as needed   Disposition Home     Diet: Soft Heart Healthy Low Carb diet for 1 week, then advance to regular consistency as tolerated.  For Heart failure patients - Check your Weight same time everyday, if you gain over 2 pounds, or you develop in leg swelling, experience more shortness of breath or chest pain, call your Primary MD immediately. Follow Cardiac Low Salt Diet and 1.5 lit/day fluid restriction.   On your next visit with your primary care physician please Get Medicines reviewed and adjusted.   Please request your Prim.MD to go over all Hospital Tests and Procedure/Radiological results at the follow up, please get all Hospital records sent to your Prim MD by signing hospital release before you go home.   If you experience worsening of your admission symptoms, develop shortness of breath, life threatening emergency, suicidal or  homicidal thoughts you must seek medical attention immediately by calling 911 or calling your MD immediately  if symptoms less severe.  You Must read complete instructions/literature along with all the possible adverse reactions/side effects for all the Medicines you take and that have been prescribed to you. Take any new Medicines after you have completely understood and accpet all the possible adverse reactions/side effects.   Do not drive, operating heavy machinery, perform activities at heights, swimming or participation in water activities or provide baby sitting services if your were admitted for syncope or siezures until you have seen by Primary MD or a Neurologist and advised to do so again.  Do  not drive when taking Pain medications.    Do not take more than prescribed Pain, Sleep and Anxiety Medications  Special Instructions: If you have smoked or chewed Tobacco  in the last 2 yrs please stop smoking, stop any regular Alcohol  and or any Recreational drug use.  Wear Seat belts while driving.   Please note  You were cared for by a hospitalist during your hospital stay. If you have any questions about your discharge medications or the care you received while you were in the hospital after you are discharged, you can call the unit and asked to speak with the hospitalist on call if the hospitalist that took care of you is not available. Once you are discharged, your primary care physician will handle any further medical issues. Please note that NO REFILLS for any discharge medications will be authorized once you are discharged, as it is imperative that you return to your primary care physician (or establish a relationship with a primary care physician if you do not have one) for your aftercare needs so that they can reassess your need for medications and monitor your lab values.                                                       Vishwa Royall was admitted to the Hospital on  06/04/2015 and Discharged  06/08/2015 and should be excused from work/school   for 10  days starting 06/04/2015 , may return to work/school without any restrictions.  Call [name] MD, [facility]  [phone] with questions.  [name] M.D on 06/08/2015,at 8:21 AM  [facility]   Office  [phone]     Increase activity slowly    Complete by:  As directed              Discharge Medications       Medication List    STOP taking these medications        sulfamethoxazole-trimethoprim 800-160 MG tablet  Commonly known as:  BACTRIM DS,SEPTRA DS      TAKE these medications        APPLE CIDER VINEGAR PO  Take 30 mLs by mouth daily.     ciprofloxacin 500 MG tablet  Commonly known as:  CIPRO  Take 1 tablet (500 mg total) by mouth 2 (two) times daily.     docusate sodium 100 MG capsule  Commonly known as:  COLACE  Take 2 capsules (200 mg total) by mouth 2 (two) times daily as needed for mild constipation.     FIBER PO  Take 9 g by mouth 2 (two) times daily.     HYDROcodone-acetaminophen 5-325 MG tablet  Commonly known as:  NORCO/VICODIN  Take 1 tablet by mouth every 6 (six) hours as needed for moderate pain.     METAMUCIL 28.3 % Powd  Generic drug:  Psyllium  Take 1 capsule by mouth daily.     metFORMIN 500 MG tablet  Commonly known as:  GLUCOPHAGE  Take 1 tablet (500 mg total) by mouth 2 (two) times daily with a meal.     metroNIDAZOLE 500 MG tablet  Commonly known as:  FLAGYL  Take 1 tablet (500 mg total) by mouth every 8 (eight) hours.     ondansetron 4 MG tablet  Commonly known as:  ZOFRAN  Take 1 tablet (4 mg total) by mouth every 8 (eight) hours as needed for nausea or vomiting.        Major procedures and Radiology Reports - PLEASE review detailed and final reports for all details, in brief -       Dg Chest 2 View  06/04/2015  CLINICAL DATA:  Nausea and abdominal pain EXAM: CHEST - 2 VIEW COMPARISON:  None. FINDINGS:  The heart size and mediastinal contours are within normal limits. Both lungs are clear. The visualized skeletal structures are unremarkable. IMPRESSION: No active disease. Electronically Signed   By: Inez Catalina M.D.   On: 06/04/2015 16:42   Ct Abdomen Pelvis W Contrast  06/04/2015  CLINICAL DATA:  54 year old male with prostatitis November 2016. Groin pain. Blood pressure dropping. Nausea. Hyperlipidemia. Diabetes. Post appendectomy. Initial encounter. EXAM: CT ABDOMEN AND PELVIS WITH CONTRAST TECHNIQUE: Multidetector CT imaging of the abdomen and pelvis was performed using the standard protocol following bolus administration of intravenous contrast. CONTRAST:  110mL OMNIPAQUE IOHEXOL 300 MG/ML SOLN, 28mL OMNIPAQUE IOHEXOL 300 MG/ML SOLN COMPARISON:  None. FINDINGS: Sigmoid mesentery loculated free air with surrounding inflammation consistent with sigmoid colon diverticulitis without drainable abscess. Fatty infiltration of the liver. No worrisome hepatic, splenic, pancreatic, renal or adrenal lesion. 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 Lung bases clear.  Heart top-normal in size. No abdominal aortic aneurysm.  No adenopathy. No calcified gallstones. Degenerative changes lower lumbar spine without osseous destructive lesion. Noncontrast filled views the urinary bladder unremarkable. Prostate gland top-normal in size. IMPRESSION: Sigmoid mesentery loculated free air with surrounding inflammation consistent with sigmoid colon diverticulitis without drainable abscess. Fatty infiltration of the liver. 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 Degenerative changes lower lumbar spine without osseous destructive lesion. Prostate gland top-normal in size. These results were called by telephone at the time of interpretation on 06/04/2015 at 4:41 pm to Dr. Fredia Sorrow , who verbally acknowledged these results. Electronically Signed   By: Genia Del M.D.   On: 06/04/2015 16:46    Micro Results        Recent Results (from the past 240 hour(s))  Culture, blood (routine x 2)     Status: None (Preliminary result)   Collection Time: 06/04/15  3:50 PM  Result Value Ref Range Status   Specimen Description BLOOD LEFT ANTECUBITAL  Final   Special Requests BOTTLES DRAWN AEROBIC AND ANAEROBIC 10CC EACH  Final   Culture   Final    NO GROWTH 3 DAYS Performed at [facility]    Report Status PENDING  Incomplete  Culture, blood (routine x 2)     Status: None (Preliminary result)   Collection Time: 06/04/15  3:55 PM  Result Value Ref Range Status   Specimen Description BLOOD RIGHT ANTECUBITAL  Final   Special Requests   Final    BOTTLES DRAWN AEROBIC AND ANAEROBIC [place] RED 10CC BLUE   Culture   Final    NO GROWTH 3 DAYS Performed at [facility]    Report Status PENDING  Incomplete   Today   Subjective    Kelen Ryals today has no headache,no chest abdominal pain,no new weakness tingling or numbness, feels much better wants to go home today.     Objective   Blood pressure 106/52, pulse 69, temperature 98.1 F (36.7 C), temperature source Oral, resp. rate 18, height 5\' 7"  (1.702 m), weight 132.541 kg (292 lb 3.2 oz), SpO2 98 %.   Intake/Output Summary (Last 24 hours)  at 06/08/15 1436 Last data filed at 06/07/15 1450  Gross per 24 hour  Intake    480 ml  Output    300 ml  Net    180 ml    Exam  Awake Alert, Oriented x 3, No new F.N deficits, Normal affect [place].AT,PERRAL Supple Neck,No JVD, No cervical lymphadenopathy appriciated.  Symmetrical Chest wall movement, Good air movement bilaterally, CTAB RRR,No Gallops,Rubs or new Murmurs, No Parasternal Heave +ve B.Sounds, Abd Soft, Non tender, No organomegaly appriciated, No rebound -guarding or rigidity. No Cyanosis, Clubbing or edema, No new Rash or bruise   Data Review   CBC w Diff:  Lab Results  Component Value Date   WBC 5.6 06/07/2015   HGB 13.8 06/07/2015   HCT 42.8 06/07/2015   PLT 248  06/07/2015   LYMPHOPCT 13 06/04/2015   MONOPCT 7 06/04/2015   EOSPCT 1 06/04/2015   BASOPCT 0 06/04/2015    CMP:  Lab Results  Component Value Date   NA 135 06/06/2015   K 4.4 06/06/2015   CL 103 06/06/2015   CO2 25 06/06/2015   BUN 8 06/06/2015   CREATININE 0.88 06/06/2015   CREATININE 0.79 02/23/2015   PROT 8.6* 06/04/2015   ALBUMIN 4.0 06/04/2015   BILITOT 0.9 06/04/2015   ALKPHOS 78 06/04/2015   AST 25 06/04/2015   ALT 30 06/04/2015  .   Total Time in preparing paper work, data evaluation and todays exam - 35 minutes  [name] M.D on 06/08/2015 at 2:36 PM  [facility]   Office  [phone]

## 2015-06-08 NOTE — Progress Notes (Signed)
[facility] Progress Note     Subjective: Having gas pains, feels a bit nauseous.  Tolerating fulls.  Not really having any pain in LLQ.  Passing flatus.  Has had several BM's.  No fevers or leukocytosis.  Ambulating, but feels very fatigued when walking.    Objective: Vital signs in last 24 hours: Temp:  [98.1 F (36.7 C)-98.4 F (36.9 C)] 98.1 F (36.7 C) (12/12 0439) Pulse Rate:  [69-76] 69 (12/12 0439) Resp:  [18-19] 18 (12/12 0439) BP: (90-97)/(45-47) 90/47 mmHg (12/12 0439) SpO2:  [98 %] 98 % (12/12 0439) Last BM Date: 06/04/15  Intake/Output from previous day: 12/11 0701 - 12/12 0700 In: 700 [P.O.:700] Out: 600 [Urine:600] Intake/Output this shift:    PE: Gen:  Alert, NAD, pleasant Abd: Obese, soft, mild distension, NT, +BS, no HSM   Lab Results:   Recent Labs  06/06/15 0338 06/07/15 0500  WBC 3.9* 5.6  HGB 12.6* 13.8  HCT 39.8 42.8  PLT 211 248   BMET  Recent Labs  06/06/15 0338  NA 135  K 4.4  CL 103  CO2 25  GLUCOSE 120*  BUN 8  CREATININE 0.88  CALCIUM 8.7*   PT/INR No results for input(s): LABPROT, INR in the last 72 hours. CMP     Component Value Date/Time   NA 135 06/06/2015 0338   K 4.4 06/06/2015 0338   CL 103 06/06/2015 0338   CO2 25 06/06/2015 0338   GLUCOSE 120* 06/06/2015 0338   BUN 8 06/06/2015 0338   CREATININE 0.88 06/06/2015 0338   CREATININE 0.79 02/23/2015 1553   CALCIUM 8.7* 06/06/2015 0338   PROT 8.6* 06/04/2015 1445   ALBUMIN 4.0 06/04/2015 1445   AST 25 06/04/2015 1445   ALT 30 06/04/2015 1445   ALKPHOS 78 06/04/2015 1445   BILITOT 0.9 06/04/2015 1445   GFRNONAA >60 06/06/2015 0338   GFRNONAA >89 02/23/2015 1553   GFRAA >60 06/06/2015 0338   GFRAA >89 02/23/2015 1553   Lipase     Component Value Date/Time   LIPASE 24 06/04/2015 1445       Studies/Results: No results found.  Anti-infectives: Anti-infectives    Start     Dose/Rate Route Frequency Ordered Stop   06/08/15 0000   ciprofloxacin (CIPRO) 500 MG tablet     500 mg Oral 2 times daily 06/08/15 0820     06/08/15 0000  metroNIDAZOLE (FLAGYL) 500 MG tablet     500 mg Oral Every 8 hours 06/08/15 0820     06/07/15 2000  ciprofloxacin (CIPRO) tablet 500 mg     500 mg Oral 2 times daily 06/07/15 1511     06/07/15 1515  metroNIDAZOLE (FLAGYL) tablet 500 mg     500 mg Oral 3 times per day 06/07/15 1511     06/05/15 0800  ciprofloxacin (CIPRO) IVPB 400 mg  Status:  Discontinued     400 mg 200 mL/hr over 60 Minutes Intravenous Every 12 hours 06/05/15 0126 06/07/15 1511   06/05/15 0200  metroNIDAZOLE (FLAGYL) IVPB 500 mg  Status:  Discontinued     500 mg 100 mL/hr over 60 Minutes Intravenous Every 8 hours 06/05/15 0126 06/07/15 1511   06/04/15 1645  ciprofloxacin (CIPRO) IVPB 400 mg     400 mg 200 mL/hr over 60 Minutes Intravenous  Once 06/04/15 1643 06/04/15 1754   06/04/15 1645  metroNIDAZOLE (FLAGYL) IVPB 500 mg     500 mg 100 mL/hr over 60 Minutes Intravenous  Once 06/04/15 1643  06/04/15 1908       Assessment/Plan Perforated diverticulitis HD #4 -Wbc nl, no fevers, exam nontender -On oral abx -Dietitian consult, he's concerned about diet given Christmas -Tolerating fulls, advance to soft and home later today if tolerating diet -Will need colonoscopy, is due for one next year 2017 since he had many polyps on his CSP ~3 years ago.  He will call his GI to see if they want to arrange for sooner follow up. -Follow up with [place] in 2-3 weeks with Dr. Kieth Brightly    LOS: 4 days    Nat Christen 06/08/2015, 9:07 AM Pager: [phone]

## 2015-06-08 NOTE — Discharge Instructions (Signed)
Follow with Primary MD METHENEY,CATHERINE, MD in 2-3 days   Get CBC, CMP, 2 view Chest X ray checked  by Primary MD next visit.    Activity: As tolerated with Full fall precautions use walker/cane & assistance as needed   Disposition Home     Diet: Soft Heart Healthy Low Carb diet for 1 week, then advance to regular consistency as tolerated.   For Heart failure patients - Check your Weight same time everyday, if you gain over 2 pounds, or you develop in leg swelling, experience more shortness of breath or chest pain, call your Primary MD immediately. Follow Cardiac Low Salt Diet and 1.5 lit/day fluid restriction.   On your next visit with your primary care physician please Get Medicines reviewed and adjusted.   Please request your Prim.MD to go over all Hospital Tests and Procedure/Radiological results at the follow up, please get all Hospital records sent to your Prim MD by signing hospital release before you go home.   If you experience worsening of your admission symptoms, develop shortness of breath, life threatening emergency, suicidal or homicidal thoughts you must seek medical attention immediately by calling 911 or calling your MD immediately  if symptoms less severe.  You Must read complete instructions/literature along with all the possible adverse reactions/side effects for all the Medicines you take and that have been prescribed to you. Take any new Medicines after you have completely understood and accpet all the possible adverse reactions/side effects.   Do not drive, operating heavy machinery, perform activities at heights, swimming or participation in water activities or provide baby sitting services if your were admitted for syncope or siezures until you have seen by Primary MD or a Neurologist and advised to do so again.  Do not drive when taking Pain medications.    Do not take more than prescribed Pain, Sleep and Anxiety Medications  Special Instructions: If you  have smoked or chewed Tobacco  in the last 2 yrs please stop smoking, stop any regular Alcohol  and or any Recreational drug use.  Wear Seat belts while driving.   Please note  You were cared for by a hospitalist during your hospital stay. If you have any questions about your discharge medications or the care you received while you were in the hospital after you are discharged, you can call the unit and asked to speak with the hospitalist on call if the hospitalist that took care of you is not available. Once you are discharged, your primary care physician will handle any further medical issues. Please note that NO REFILLS for any discharge medications will be authorized once you are discharged, as it is imperative that you return to your primary care physician (or establish a relationship with a primary care physician if you do not have one) for your aftercare needs so that they can reassess your need for medications and monitor your lab values.                                                       Frederick Yang was admitted to the Hospital on 06/04/2015 and Discharged  06/08/2015 and should be excused from work/school   for 10  days starting 06/04/2015 , may return to work/school without any restrictions.  Call [name] MD, [facility]  [phone] with questions.  [name] M.D on 06/08/2015,at 8:21 AM  [facility]   Office  [phone]  Diverticulitis Diverticulitis is inflammation or infection of small pouches in your colon that form when you have a condition called diverticulosis. The pouches in your colon are called diverticula. Your colon, or large intestine, is where water is absorbed and stool is formed. Complications of diverticulitis can include:  Bleeding.  Severe infection.  Severe pain.  Perforation of your colon.  Obstruction of your colon. CAUSES  Diverticulitis is caused by bacteria. Diverticulitis happens when stool becomes trapped  in diverticula. This allows bacteria to grow in the diverticula, which can lead to inflammation and infection. RISK FACTORS People with diverticulosis are at risk for diverticulitis. Eating a diet that does not include enough fiber from fruits and vegetables may make diverticulitis more likely to develop. SYMPTOMS  Symptoms of diverticulitis may include:  Abdominal pain and tenderness. The pain is normally located on the left side of the abdomen, but may occur in other areas.  Fever and chills.  Bloating.  Cramping.  Nausea.  Vomiting.  Constipation.  Diarrhea.  Blood in your stool. DIAGNOSIS  Your health care provider will ask you about your medical history and do a physical exam. You may need to have tests done because many medical conditions can cause the same symptoms as diverticulitis. Tests may include:  Blood tests.  Urine tests.  Imaging tests of the abdomen, including X-rays and CT scans. When your condition is under control, your health care provider may recommend that you have a colonoscopy. A colonoscopy can show how severe your diverticula are and whether something else is causing your symptoms. TREATMENT  Most cases of diverticulitis are mild and can be treated at home. Treatment may include:  Taking over-the-counter pain medicines.  Following a clear liquid diet.  Taking antibiotic medicines by mouth for 7-10 days. More severe cases may be treated at a hospital. Treatment may include:  Not eating or drinking.  Taking prescription pain medicine.  Receiving antibiotic medicines through an IV tube.  Receiving fluids and nutrition through an IV tube.  Surgery. HOME CARE INSTRUCTIONS   Follow your health care provider's instructions carefully.  Follow a full liquid diet or other diet as directed by your health care provider. After your symptoms improve, your health care provider may tell you to change your diet. He or she may recommend you eat a  high-fiber diet. Fruits and vegetables are good sources of fiber. Fiber makes it easier to pass stool.  Take fiber supplements or probiotics as directed by your health care provider.  Only take medicines as directed by your health care provider.  Keep all your follow-up appointments. SEEK MEDICAL CARE IF:   Your pain does not improve.  You have a hard time eating food.  Your bowel movements do not return to normal. SEEK IMMEDIATE MEDICAL CARE IF:   Your pain becomes worse.  Your symptoms do not get better.  Your symptoms suddenly get worse.  You have a fever.  You have repeated vomiting.  You have bloody or black, tarry stools. MAKE SURE YOU:   Understand these instructions.  Will watch your condition.  Will get help right away if you are not doing well or get worse.   This information is not intended to replace advice given to you by your health care provider. Make sure you discuss any questions you have with your health care provider.   Document Released: 03/23/2005 Document Revised: 06/18/2013 Document Reviewed:  05/08/2013 Elsevier Interactive Patient Education 2016 Elsevier Inc.   Low-Fiber Diet Fiber is found in fruits, vegetables, and whole grains. A low-fiber diet restricts fibrous foods that are not digested in the small intestine. A diet containing about 10-15 grams of fiber per day is considered low fiber. Low-fiber diets may be used to:  Promote healing and rest the bowel during intestinal flare-ups.  Prevent blockage of a partially obstructed or narrowed gastrointestinal tract.  Reduce fecal weight and volume.  Slow the movement of feces. You may be on a low-fiber diet as a transitional diet following surgery, after an injury (trauma), or because of a short (acute) or lifelong (chronic) illness. Your health care provider will determine the length of time you need to stay on this diet.  WHAT DO I NEED TO KNOW ABOUT A LOW-FIBER DIET? Always check the  fiber content on the packaging's Nutrition Facts label, especially on foods from the grains list. Ask your dietitian if you have questions about specific foods that are related to your condition, especially if the food is not listed below. In general, a low-fiber food will have less than 2 g of fiber. WHAT FOODS CAN I EAT? Grains All breads and crackers made with white flour. Sweet rolls, doughnuts, waffles, pancakes, [place] toast, bagels. Pretzels, Melba toast, zwieback. Well-cooked cereals, such as cornmeal, farina, or cream cereals. Dry cereals that do not contain whole grains, fruit, or nuts, such as refined corn, wheat, rice, and oat cereals. Potatoes prepared any way without skins, plain pastas and noodles, refined white rice. Use white flour for baking and making sauces. Use allowed list of grains for casseroles, dumplings, and puddings.  Vegetables Strained tomato and vegetable juices. Fresh lettuce, cucumber, spinach. Well-cooked (no skin or pulp) or canned vegetables, such as asparagus, bean sprouts, beets, carrots, green beans, mushrooms, potatoes, pumpkin, spinach, yellow squash, tomato sauce/puree, turnips, yams, and zucchini. Keep servings limited to  cup.  Fruits All fruit juices except prune juice. Cooked or canned fruits without skin and seeds, such as applesauce, apricots, cherries, fruit cocktail, grapefruit, grapes, mandarin oranges, melons, peaches, pears, pineapple, and plums. Fresh fruits without skin, such as apricots, avocados, bananas, melons, pineapple, nectarines, and peaches. Keep servings limited to  cup or 1 piece.  Meat and Other Protein Sources Ground or well-cooked tender beef, ham, veal, lamb, pork, or poultry. Eggs, plain cheese. Fish, oysters, shrimp, lobster, and other seafood. Liver, organ meats. Smooth nut butters. Dairy All milk products and alternative dairy substitutes, such as soy, rice, almond, and coconut, not containing added whole nuts, seeds, or added  fruit. Beverages Decaf coffee, fruit, and vegetable juices or smoothies (small amounts, with no pulp or skins, and with fruits from allowed list), sports drinks, herbal tea. Condiments Ketchup, mustard, vinegar, cream sauce, cheese sauce, cocoa powder. Spices in moderation, such as allspice, basil, bay leaves, celery powder or leaves, cinnamon, cumin powder, curry powder, ginger, mace, marjoram, onion or garlic powder, oregano, paprika, parsley flakes, ground pepper, rosemary, sage, savory, tarragon, thyme, and turmeric. Sweets and Desserts Plain cakes and cookies, pie made with allowed fruit, pudding, custard, cream pie. Gelatin, fruit, ice, sherbet, frozen ice pops. Ice cream, ice milk without nuts. Plain hard candy, honey, jelly, molasses, syrup, sugar, chocolate syrup, gumdrops, marshmallows. Limit overall sugar intake.  Fats and Oil Margarine, butter, cream, mayonnaise, salad oils, plain salad dressings made from allowed foods. Choose healthy fats such as olive oil, canola oil, and omega-3 fatty acids (such as found in salmon or  tuna) when possible.  Other Bouillon, broth, or cream soups made from allowed foods. Any strained soup. Casseroles or mixed dishes made with allowed foods. The items listed above may not be a complete list of recommended foods or beverages. Contact your dietitian for more options.  WHAT FOODS ARE NOT RECOMMENDED? Grains All whole wheat and whole grain breads and crackers. Multigrains, rye, bran seeds, nuts, or coconut. Cereals containing whole grains, multigrains, bran, coconut, nuts, raisins. Cooked or dry oatmeal, steel-cut oats. Coarse wheat cereals, granola. Cereals advertised as high fiber. Potato skins. Whole grain pasta, wild or brown rice. Popcorn. Coconut flour. Bran, buckwheat, corn bread, multigrains, rye, wheat germ.  Vegetables Fresh, cooked or canned vegetables, such as artichokes, asparagus, beet greens, broccoli, Brussels sprouts, cabbage, celery,  cauliflower, corn, eggplant, kale, legumes or beans, okra, peas, and tomatoes. Avoid large servings of any vegetables, especially raw vegetables.  Fruits Fresh fruits, such as apples with or without skin, berries, cherries, figs, grapes, grapefruit, guavas, kiwis, mangoes, oranges, papayas, pears, persimmons, pineapple, and pomegranate. Prune juice and juices with pulp, stewed or dried prunes. Dried fruits, dates, raisins. Fruit seeds or skins. Avoid large servings of all fresh fruits. Meats and Other Protein Sources Tough, fibrous meats with gristle. Chunky nut butter. Cheese made with seeds, nuts, or other foods not recommended. Nuts, seeds, legumes (beans, including baked beans), dried peas, beans, lentils.  Dairy Yogurt or cheese that contains nuts, seeds, or added fruit.  Beverages Fruit juices with high pulp, prune juice. Caffeinated coffee and teas.  Condiments Coconut, maple syrup, pickles, olives. Sweets and Desserts Desserts, cookies, or candies that contain nuts or coconut, chunky peanut butter, dried fruits. Jams, preserves with seeds, marmalade. Large amounts of sugar and sweets. Any other dessert made with fruits from the not recommended list.  Other Soups made from vegetables that are not recommended or that contain other foods not recommended.  The items listed above may not be a complete list of foods and beverages to avoid. Contact your dietitian for more information.   This information is not intended to replace advice given to you by your health care provider. Make sure you discuss any questions you have with your health care provider.   Document Released: 12/03/2001 Document Revised: 06/18/2013 Document Reviewed: 05/06/2013 Elsevier Interactive Patient Education [employer].

## 2015-06-08 NOTE — Plan of Care (Signed)
Problem: Food- and Nutrition-Related Knowledge Deficit (NB-1.1) Goal: Nutrition education Formal process to instruct or train a patient/client in a skill or to impart knowledge to help patients/clients voluntarily manage or modify food choices and eating behavior to maintain or improve health. Outcome: Completed/Met Date Met:  06/08/15 RD consulted for diet education regarding diverticulitis.   Pt and family was given handout "Low Fiber Nutrition Therapy" from the Academy of Nutrition and Dietetics. Discussed staying on a low fiber diet (6-10 grams of fiber/day) for 6-8 weeks and start then transitioning to a high fiber diet (25-30 grams fiber/day). A list of foods recommended and not recommended on the diet  were given and discussed. Encouraged adequate protein intake. Teach back method used. RD contact information was given. Pt to be discharged today.   Expect good compliance.   Corrin Parker, MS, RD, LDN Pager # [phone] After hours/ weekend pager # [phone]

## 2015-06-09 LAB — CULTURE, BLOOD (ROUTINE X 2)
Culture: NO GROWTH
Culture: NO GROWTH

## 2015-06-10 ENCOUNTER — Encounter: Payer: Self-pay | Admitting: Family Medicine

## 2015-06-10 ENCOUNTER — Ambulatory Visit (INDEPENDENT_AMBULATORY_CARE_PROVIDER_SITE_OTHER): Payer: Private Health Insurance - Indemnity | Admitting: Family Medicine

## 2015-06-10 VITALS — BP 117/66 | HR 73 | Wt 289.0 lb

## 2015-06-10 DIAGNOSIS — K5732 Diverticulitis of large intestine without perforation or abscess without bleeding: Secondary | ICD-10-CM

## 2015-06-10 DIAGNOSIS — E1165 Type 2 diabetes mellitus with hyperglycemia: Secondary | ICD-10-CM | POA: Diagnosis not present

## 2015-06-10 DIAGNOSIS — IMO0001 Reserved for inherently not codable concepts without codable children: Secondary | ICD-10-CM

## 2015-06-10 LAB — COMPLETE METABOLIC PANEL WITH GFR
ALBUMIN: 3.9 g/dL (ref 3.6–5.1)
ALK PHOS: 67 U/L (ref 40–115)
ALT: 42 U/L (ref 9–46)
AST: 33 U/L (ref 10–35)
BILIRUBIN TOTAL: 0.5 mg/dL (ref 0.2–1.2)
BUN: 10 mg/dL (ref 7–25)
CO2: 26 mmol/L (ref 20–31)
CREATININE: 0.82 mg/dL (ref 0.70–1.33)
Calcium: 9.8 mg/dL (ref 8.6–10.3)
Chloride: 100 mmol/L (ref 98–110)
Glucose, Bld: 91 mg/dL (ref 65–99)
Potassium: 5.2 mmol/L (ref 3.5–5.3)
Sodium: 135 mmol/L (ref 135–146)
TOTAL PROTEIN: 7.7 g/dL (ref 6.1–8.1)

## 2015-06-10 LAB — CBC WITH DIFFERENTIAL/PLATELET
BASOS PCT: 1 % (ref 0–1)
Basophils Absolute: 0.1 10*3/uL (ref 0.0–0.1)
EOS ABS: 0.2 10*3/uL (ref 0.0–0.7)
EOS PCT: 3 % (ref 0–5)
HEMATOCRIT: 45.9 % (ref 39.0–52.0)
HEMOGLOBIN: 15.1 g/dL (ref 13.0–17.0)
Lymphocytes Relative: 39 % (ref 12–46)
Lymphs Abs: 2.3 10*3/uL (ref 0.7–4.0)
MCH: 28.4 pg (ref 26.0–34.0)
MCHC: 32.9 g/dL (ref 30.0–36.0)
MCV: 86.3 fL (ref 78.0–100.0)
MONO ABS: 0.6 10*3/uL (ref 0.1–1.0)
MPV: 11.3 fL (ref 8.6–12.4)
Monocytes Relative: 10 % (ref 3–12)
Neutro Abs: 2.8 10*3/uL (ref 1.7–7.7)
Neutrophils Relative %: 47 % (ref 43–77)
Platelets: 295 10*3/uL (ref 150–400)
RBC: 5.32 MIL/uL (ref 4.22–5.81)
RDW: 13.5 % (ref 11.5–15.5)
WBC: 6 10*3/uL (ref 4.0–10.5)

## 2015-06-10 LAB — POCT UA - MICROALBUMIN
CREATININE, POC: 200 mg/dL
Microalbumin Ur, POC: 80 mg/L

## 2015-06-10 NOTE — Progress Notes (Signed)
   Subjective:    Patient ID: Frederick Yang, male    DOB: 05/13/1961, 54 y.o.   MRN: [id number]  HPI F/U hospital visit for diverticulitis.  I saw him in the office on December 8 with abdominal pain and hypotension. Sent to the emergency department. He was admitted on the same day December 8 and discharged home on December 12. He had a perforated diverticulitis episode. Fortunately they were able to get his white blood cell count down with IV antibiotic sent bowel rest. He fortunately did not have to have surgery. He feels like he is 75% better. He is almost back to normal diet but did have some questions about that today. Also wanted to know if he is to continue the Anaprox that they sent him home on or switch back to the antibiotic sent I put him on potassium they did recommend a repeat CMP and CBC. They also recommended a repeat chest x-ray. That he had a normal chest x-ray done in the emergency department and the base of the lung seen on the CT scan were normal as well. He's no longer having any fevers or hypotensive episodes. He has not started diabetic medication yet. But does have a follow-up in January for that.   Review of Systems     Objective:   Physical Exam  Constitutional: He is oriented to person, place, and time. He appears well-developed and well-nourished.  HENT:  Head: Normocephalic and atraumatic.  Cardiovascular: Normal rate, regular rhythm and normal heart sounds.   Pulmonary/Chest: Effort normal and breath sounds normal.  Abdominal:    Neurological: He is alert and oriented to person, place, and time.  Skin: Skin is warm and dry.  Psychiatric: He has a normal mood and affect. His behavior is normal.          Assessment & Plan:  Diverticulitis-he is 75% better. Discussed advancing diet and hopefully he will tolerate this well. Complete antibiotic course. Call if suddenly worse or develops a fever. They did go ahead and schedule him for colonoscopy in January.  His repeat colonoscopy was technically due next year. He is on a 3 year schedule. Discussed increasing fiber over the next couple of weeks. Okay to restart multivitamins etc. in about 2-3 weeks once he is feeling much better. Next  Diabetes-he will follow back up in January. Hopefully in the next couple weeks when he is feeling better he can start the metformin and continue to keep an eye on his blood sugars.   Will give  Pneumovax 23 injection at next OV.

## 2015-06-15 ENCOUNTER — Ambulatory Visit (INDEPENDENT_AMBULATORY_CARE_PROVIDER_SITE_OTHER): Payer: Private Health Insurance - Indemnity | Admitting: Family Medicine

## 2015-06-15 ENCOUNTER — Encounter: Payer: Self-pay | Admitting: Family Medicine

## 2015-06-15 VITALS — BP 119/74 | HR 76 | Temp 97.9°F | Wt 292.0 lb

## 2015-06-15 DIAGNOSIS — K5732 Diverticulitis of large intestine without perforation or abscess without bleeding: Secondary | ICD-10-CM

## 2015-06-15 NOTE — Progress Notes (Signed)
   Subjective:    Patient ID: Frederick Yang, male    DOB: Mar 17, 1961, 54 y.o.   MRN: [id number]  HPI Says has been doing better but the last 2 days hasn't felt well.  Has been more nauseated and more uncomfortable. Some cramps.  Needs referral for Colonoscopy.  No fever or chills.  Stomach still feels sore.  Still some abdominal cramping but not severe.  He is still limiting fresh veggies. Says he felt much better on Sat but has started to feel worse today.  He is still on Cipro and flagyl.  Using his Zofran some for nausea.  Started back to work today.     Review of Systems     Objective:   Physical Exam  Constitutional: He is oriented to person, place, and time. He appears well-developed and well-nourished.  HENT:  Head: Normocephalic and atraumatic.  Cardiovascular: Normal rate, regular rhythm and normal heart sounds.   Pulmonary/Chest: Effort normal and breath sounds normal.  Abdominal: He exhibits no distension and no mass. There is tenderness. There is no rebound and no guarding.  Mild tenderness in the left side of abdomen.    Neurological: He is alert and oriented to person, place, and time.  Skin: Skin is warm and dry.  Psychiatric: He has a normal mood and affect. His behavior is normal.          Assessment & Plan:  Diverticulitis- mild tenderness on exam.  Will check CBC.  If WBC elevated to consider change antibiotic versus repeat CT scan.  Recommend go back to more soft and liquid diet.  [place] for work note if needed.

## 2015-06-16 ENCOUNTER — Encounter: Payer: Self-pay | Admitting: Family Medicine

## 2015-06-16 LAB — COMPLETE METABOLIC PANEL WITH GFR
ALBUMIN: 3.9 g/dL (ref 3.6–5.1)
ALK PHOS: 59 U/L (ref 40–115)
ALT: 22 U/L (ref 9–46)
AST: 16 U/L (ref 10–35)
BILIRUBIN TOTAL: 0.3 mg/dL (ref 0.2–1.2)
BUN: 14 mg/dL (ref 7–25)
CALCIUM: 9.4 mg/dL (ref 8.6–10.3)
CO2: 27 mmol/L (ref 20–31)
Chloride: 99 mmol/L (ref 98–110)
Creat: 0.79 mg/dL (ref 0.70–1.33)
GFR, Est African American: >89 mL/min (ref >=60)
GLUCOSE: 103 mg/dL — AB (ref 65–99)
POTASSIUM: 4.8 mmol/L (ref 3.5–5.3)
SODIUM: 134 mmol/L — AB (ref 135–146)
TOTAL PROTEIN: 7.8 g/dL (ref 6.1–8.1)

## 2015-06-16 LAB — CBC WITH DIFFERENTIAL/PLATELET
BASOS PCT: 1 % (ref 0–1)
Basophils Absolute: 0.1 10*3/uL (ref 0.0–0.1)
EOS ABS: 0.1 10*3/uL (ref 0.0–0.7)
Eosinophils Relative: 2 % (ref 0–5)
HCT: 44.3 % (ref 39.0–52.0)
HEMOGLOBIN: 15 g/dL (ref 13.0–17.0)
LYMPHS ABS: 2.8 10*3/uL (ref 0.7–4.0)
Lymphocytes Relative: 39 % (ref 12–46)
MCH: 28.8 pg (ref 26.0–34.0)
MCHC: 33.9 g/dL (ref 30.0–36.0)
MCV: 85 fL (ref 78.0–100.0)
MPV: 10.8 fL (ref 8.6–12.4)
Monocytes Absolute: 0.9 10*3/uL (ref 0.1–1.0)
Monocytes Relative: 12 % (ref 3–12)
NEUTROS ABS: 3.4 10*3/uL (ref 1.7–7.7)
NEUTROS PCT: 46 % (ref 43–77)
Platelets: 257 10*3/uL (ref 150–400)
RBC: 5.21 MIL/uL (ref 4.22–5.81)
RDW: 13.8 % (ref 11.5–15.5)
WBC: 7.3 10*3/uL (ref 4.0–10.5)

## 2015-06-30 ENCOUNTER — Encounter: Payer: Self-pay | Admitting: Family Medicine

## 2015-06-30 ENCOUNTER — Ambulatory Visit (INDEPENDENT_AMBULATORY_CARE_PROVIDER_SITE_OTHER): Payer: Managed Care, Other (non HMO) | Admitting: Family Medicine

## 2015-06-30 VITALS — BP 123/63 | HR 79 | Temp 97.8°F | Ht 67.0 in | Wt 289.0 lb

## 2015-06-30 DIAGNOSIS — IMO0001 Reserved for inherently not codable concepts without codable children: Secondary | ICD-10-CM

## 2015-06-30 DIAGNOSIS — K5720 Diverticulitis of large intestine with perforation and abscess without bleeding: Secondary | ICD-10-CM

## 2015-06-30 DIAGNOSIS — Z23 Encounter for immunization: Secondary | ICD-10-CM

## 2015-06-30 DIAGNOSIS — E1165 Type 2 diabetes mellitus with hyperglycemia: Secondary | ICD-10-CM

## 2015-06-30 NOTE — Progress Notes (Signed)
   Subjective:    Patient ID: Frederick Yang, male    DOB: 07-Sep-1960, 55 y.o.   MRN: [id number]  HPI Here for follow-up diverticulitis with abscess and perforation-Says he is doing well. No change in bowels.  Trying to eat more normally  He went back to work today and  Did well.  Says he is about 90% better.  Was able to have spaghetti sauce over the weekend and di dwell. No fever or chills.  Still holding off on raw vegetables.  Weight is stable.  No blood in the stool. Scheduled for colonoscopy next a month with Dr. Cindee Salt.  Diabetes - hasn't started the metformin yet.  Wanted to wait until he is feeling better.    Review of Systems     Objective:   Physical Exam  Constitutional: He is oriented to person, place, and time. He appears well-developed and well-nourished.  HENT:  Head: Normocephalic and atraumatic.  Cardiovascular: Normal rate, regular rhythm and normal heart sounds.   Pulmonary/Chest: Effort normal and breath sounds normal.  Abdominal: Soft. Bowel sounds are normal. He exhibits no distension and no mass. There is no tenderness. There is no rebound and no guarding.  Neurological: He is alert and oriented to person, place, and time.  Skin: Skin is warm and dry.  Psychiatric: He has a normal mood and affect. His behavior is normal.          Assessment & Plan:   Diverticulitis-he is doing fantastic and is 90% better. Just continue to work on advancing diet as tolerated. Make sure to keep appointment next month for colonoscopy. Certainly call if symptoms worsen or develops more pain or fevers or chills or change in bowel movements.   Pneumovax 23 given today.   [facility] - Follow-up in 1-2 months for diabetes.

## 2015-07-20 ENCOUNTER — Encounter: Payer: Private Health Insurance - Indemnity | Admitting: Family Medicine

## 2015-08-04 ENCOUNTER — Ambulatory Visit (INDEPENDENT_AMBULATORY_CARE_PROVIDER_SITE_OTHER): Payer: Managed Care, Other (non HMO) | Admitting: Family Medicine

## 2015-08-04 ENCOUNTER — Encounter: Payer: Self-pay | Admitting: Family Medicine

## 2015-08-04 VITALS — BP 113/64 | HR 79 | Ht 67.0 in | Wt 285.0 lb

## 2015-08-04 DIAGNOSIS — Z1159 Encounter for screening for other viral diseases: Secondary | ICD-10-CM | POA: Diagnosis not present

## 2015-08-04 DIAGNOSIS — E119 Type 2 diabetes mellitus without complications: Secondary | ICD-10-CM | POA: Diagnosis not present

## 2015-08-04 DIAGNOSIS — Z125 Encounter for screening for malignant neoplasm of prostate: Secondary | ICD-10-CM

## 2015-08-04 DIAGNOSIS — R972 Elevated prostate specific antigen [PSA]: Secondary | ICD-10-CM

## 2015-08-04 DIAGNOSIS — Z114 Encounter for screening for human immunodeficiency virus [HIV]: Secondary | ICD-10-CM

## 2015-08-04 DIAGNOSIS — J069 Acute upper respiratory infection, unspecified: Secondary | ICD-10-CM

## 2015-08-04 DIAGNOSIS — Z0000 Encounter for general adult medical examination without abnormal findings: Secondary | ICD-10-CM

## 2015-08-04 DIAGNOSIS — Z0001 Encounter for general adult medical examination with abnormal findings: Secondary | ICD-10-CM | POA: Diagnosis not present

## 2015-08-04 LAB — POCT GLYCOSYLATED HEMOGLOBIN (HGB A1C): Hemoglobin A1C: 6

## 2015-08-04 NOTE — Progress Notes (Signed)
Subjective:    Patient ID: Frederick Yang, male    DOB: [birth year], 55 y.o.   MRN: [id number]  HPI Here for CPE. Has been working out 3-4 times a week.  He also went for his nutrition referral for recent new diagnosis of diabetes. The major updates to family history today.  Diabetes - no hypoglycemic events. No wounds or sores that are not healing well. No increased thirst or urination. Checking glucose at home. Taking medications as prescribed without any side effects.   5 days started with ST and now has some cough and congestion. Using congestoin.  No fever, chils or sweats.  + runny nose.  Using some [place] cold.  No bodyaches.    Review of Systems   BP 113/64 mmHg  Pulse 79  Ht 5' 7"  (1.702 m)  Wt 285 lb (129.275 kg)  BMI 44.63 kg/m2    No Known Allergies  Past Medical History  Diagnosis Date  . Prostate hypertrophy   . Hyperlipidemia   . GERD (gastroesophageal reflux disease)   . Diabetes mellitus without complication [facility])     Past Surgical History  Procedure Laterality Date  . Hernia repair      RT groin  . Appendectomy    . Treatment fistula anal    . Hemorrhoid banding  06/24/13    Dr. Ludwig Lean  . Hemorrhoid banding  07/12/2013  . Right shoulder surgery  03/06/2014    Dr. Tamera Punt     Social History   Social History  . Marital Status: Married    Spouse Name: N/A  . Number of Children: 3   . Years of Education: N/A   Occupational History  .  [place] History Main Topics  . Smoking status: Former Smoker    Types: Cigarettes    Quit date: 06/27/1988  . Smokeless tobacco: Never Used  . Alcohol Use: Yes  . Drug Use: No  . Sexual Activity: Not on file   Other Topics Concern  . Not on file   Social History Narrative   Started some exercise. Occ caffeine but not daily.     Family History  Problem Relation Age of Onset  . Breast cancer Mother 38  . Alcohol abuse Maternal Uncle   . Heart attack Maternal Uncle   .  Stroke Maternal Uncle   . Lung cancer Maternal Grandfather   . Diabetes Maternal Aunt   . Hypertension Brother   . Hypertension Sister   . Diabetes Sister   . Uterine cancer Mother     Outpatient Encounter Prescriptions as of 08/04/2015  Medication Sig  . Blood Glucose Monitoring Suppl (CVS BLOOD GLUCOSE METER) w/Device KIT USE AS DIRECTED ONCE A DAY  . CVS ADVANCED GLUCOSE TEST test strip USE AS DIRECTED ONCE A DAY  . CVS LANCETS ULTRA-THIN 30G MISC USE AS DIRECTED ONCE A DAY  . metFORMIN (GLUCOPHAGE) 500 MG tablet Take 1 tablet (500 mg total) by mouth 2 (two) times daily with a meal.  . [DISCONTINUED] Calcium-Magnesium-Vitamin D (CALCIUM 500 PO) Take 500 mg by mouth 2 (two) times daily.  . [DISCONTINUED] Cinnamon 500 MG capsule Take 2,000 mg by mouth daily.  . [DISCONTINUED] docusate sodium (COLACE) 100 MG capsule Take 2 capsules (200 mg total) by mouth 2 (two) times daily as needed for mild constipation.  . [DISCONTINUED] FIBER PO Take 9 g by mouth 2 (two) times daily.  . [DISCONTINUED] fish oil-omega-3 fatty acids 1000 MG capsule Take 4  g by mouth daily.    . [DISCONTINUED] Garlic 0370 MG CAPS Take 1,000 mg by mouth.  . [DISCONTINUED] glucosamine-chondroitin 500-400 MG tablet Take 1 tablet by mouth 3 (three) times daily.  . [DISCONTINUED] Magnesium 500 MG CAPS Take 500 mg by mouth.  . [DISCONTINUED] Multiple Vitamins-Minerals (MULTIVITAMIN WITH MINERALS) tablet Take 1 tablet by mouth daily.  . [DISCONTINUED] niacin 500 MG tablet Take 500 mg by mouth daily with breakfast.   No facility-administered encounter medications on file as of 08/04/2015.          Objective:   Physical Exam  Constitutional: He is oriented to person, place, and time. He appears well-developed and well-nourished.  HENT:  Head: Normocephalic and atraumatic.  Right Ear: External ear normal.  Left Ear: External ear normal.  Nose: Nose normal.  Mouth/Throat: Oropharynx is clear and moist.  Eyes: Conjunctivae  and EOM are normal. Pupils are equal, round, and reactive to light.  Neck: Normal range of motion. Neck supple. No thyromegaly present.  Cardiovascular: Normal rate, regular rhythm, normal heart sounds and intact distal pulses.   Pulmonary/Chest: Effort normal and breath sounds normal.  Abdominal: Soft. Bowel sounds are normal. He exhibits no distension and no mass. There is no tenderness. There is no rebound and no guarding.  Musculoskeletal: Normal range of motion.  Lymphadenopathy:    He has no cervical adenopathy.  Neurological: He is alert and oriented to person, place, and time. He has normal reflexes.  Skin: Skin is warm and dry.  Psychiatric: He has a normal mood and affect. His behavior is normal. Judgment and thought content normal.        Assessment & Plan:   CPE Keep up a regular exercise program and make sure you are eating a healthy diet Try to eat 4 servings of dairy a day, or if you are lactose intolerant take a calcium with vitamin D daily.  Your vaccines are up to date.  Needs UTD PSA, Hep C screen and HIV screen.    DM- well controlled. A1C of 6.0 today.  Saw a nutritionist last weekend.  Ok to restart metformin, but can start with once a day.  Foot exam performed today.    URI x 5 days. Call if not better by the end of the week.  Call sooner if gets worse. Ok for symptomatic care.

## 2015-08-04 NOTE — Patient Instructions (Addendum)
Ok to restart the metformin, can start with once a day.    Keep up a regular exercise program and make sure you are eating a healthy diet Try to eat 4 servings of dairy a day, or if you are lactose intolerant take a calcium with vitamin D daily.  Your vaccines are up to date.

## 2015-08-05 LAB — HEPATITIS C ANTIBODY: HCV AB: NEGATIVE

## 2015-08-05 LAB — PSA: PSA: 3.98 ng/mL (ref <=4.00)

## 2015-08-05 LAB — HIV ANTIBODY (ROUTINE TESTING W REFLEX): HIV 1&2 Ab, 4th Generation: NONREACTIVE

## 2015-08-05 NOTE — Addendum Note (Signed)
Addended by: Teddy Spike on: 08/05/2015 05:28 PM   Modules accepted: Orders

## 2015-08-10 LAB — HM COLONOSCOPY

## 2015-08-13 ENCOUNTER — Encounter: Payer: Self-pay | Admitting: Family Medicine

## 2015-08-18 ENCOUNTER — Encounter: Payer: Self-pay | Admitting: Family Medicine

## 2015-08-19 ENCOUNTER — Ambulatory Visit: Payer: Private Health Insurance - Indemnity | Admitting: Internal Medicine

## 2015-09-04 ENCOUNTER — Ambulatory Visit (HOSPITAL_BASED_OUTPATIENT_CLINIC_OR_DEPARTMENT_OTHER)
Admission: RE | Admit: 2015-09-04 | Discharge: 2015-09-04 | Disposition: A | Discharge status: Home / Self Care | Payer: Managed Care, Other (non HMO) | Source: Ambulatory Visit | Attending: Family Medicine | Admitting: Family Medicine

## 2015-09-04 ENCOUNTER — Ambulatory Visit (INDEPENDENT_AMBULATORY_CARE_PROVIDER_SITE_OTHER): Payer: Managed Care, Other (non HMO) | Admitting: Family Medicine

## 2015-09-04 ENCOUNTER — Encounter (HOSPITAL_BASED_OUTPATIENT_CLINIC_OR_DEPARTMENT_OTHER): Payer: Self-pay

## 2015-09-04 ENCOUNTER — Encounter: Payer: Self-pay | Admitting: Family Medicine

## 2015-09-04 VITALS — BP 106/52 | HR 73 | Temp 98.4°F | Wt 283.0 lb

## 2015-09-04 DIAGNOSIS — R1031 Right lower quadrant pain: Secondary | ICD-10-CM | POA: Diagnosis not present

## 2015-09-04 DIAGNOSIS — K5732 Diverticulitis of large intestine without perforation or abscess without bleeding: Secondary | ICD-10-CM | POA: Diagnosis not present

## 2015-09-04 DIAGNOSIS — R109 Unspecified abdominal pain: Secondary | ICD-10-CM | POA: Insufficient documentation

## 2015-09-04 DIAGNOSIS — Z8719 Personal history of other diseases of the digestive system: Secondary | ICD-10-CM

## 2015-09-04 LAB — COMPLETE METABOLIC PANEL WITH GFR
ALT: 23 U/L (ref 9–46)
AST: 18 U/L (ref 10–35)
Albumin: 3.8 g/dL (ref 3.6–5.1)
Alkaline Phosphatase: 71 U/L (ref 40–115)
BILIRUBIN TOTAL: 0.4 mg/dL (ref 0.2–1.2)
BUN: 12 mg/dL (ref 7–25)
CHLORIDE: 103 mmol/L (ref 98–110)
CO2: 27 mmol/L (ref 20–31)
Calcium: 9.3 mg/dL (ref 8.6–10.3)
Creat: 0.93 mg/dL (ref 0.70–1.33)
Glucose, Bld: 83 mg/dL (ref 65–99)
Potassium: 4.4 mmol/L (ref 3.5–5.3)
Sodium: 140 mmol/L (ref 135–146)
Total Protein: 7.4 g/dL (ref 6.1–8.1)

## 2015-09-04 LAB — CBC WITH DIFFERENTIAL/PLATELET
Basophils Absolute: 0 10*3/uL (ref 0.0–0.1)
Basophils Relative: 0 % (ref 0–1)
EOS ABS: 0.2 10*3/uL (ref 0.0–0.7)
Eosinophils Relative: 3 % (ref 0–5)
HCT: 44.3 % (ref 39.0–52.0)
Hemoglobin: 14.7 g/dL (ref 13.0–17.0)
LYMPHS ABS: 2.5 10*3/uL (ref 0.7–4.0)
LYMPHS PCT: 37 % (ref 12–46)
MCH: 28.4 pg (ref 26.0–34.0)
MCHC: 33.2 g/dL (ref 30.0–36.0)
MCV: 85.7 fL (ref 78.0–100.0)
MONOS PCT: 10 % (ref 3–12)
MPV: 11 fL (ref 8.6–12.4)
Monocytes Absolute: 0.7 10*3/uL (ref 0.1–1.0)
NEUTROS PCT: 50 % (ref 43–77)
Neutro Abs: 3.4 10*3/uL (ref 1.7–7.7)
PLATELETS: 274 10*3/uL (ref 150–400)
RBC: 5.17 MIL/uL (ref 4.22–5.81)
RDW: 14 % (ref 11.5–15.5)
WBC: 6.8 10*3/uL (ref 4.0–10.5)

## 2015-09-04 MED ORDER — IOHEXOL 300 MG/ML  SOLN
100.0000 mL | Freq: Once | INTRAMUSCULAR | Status: DC | PRN
Start: 2015-09-04 — End: 2015-09-05

## 2015-09-04 NOTE — Progress Notes (Signed)
Subjective:    Patient ID: Frederick Yang, male    DOB: Jun 23, 1961, 55 y.o.   MRN: [id number]  HPI Monday, 5 days noticed feeling cramp and gassy.  Says has gotten worse as the week has gone. No fever.  No change in consistancy of BM. No N/V. Had colonoscpy about 3 weeks ago.  Infection was cleared.  Pain has woken him up at night. Feels worse after a meal.  No fever. He does feel more tired and just doesn't quite feel right today. He did up his fiber intake about a week and a half ago.   Review of Systems  BP 106/52 mmHg  Pulse 73  Temp(Src) 98.4 F (36.9 C)  Wt 283 lb (128.368 kg)  SpO2 97%    No Known Allergies  Past Medical History  Diagnosis Date  . Prostate hypertrophy   . Hyperlipidemia   . GERD (gastroesophageal reflux disease)   . Diabetes mellitus without complication [facility])     Past Surgical History  Procedure Laterality Date  . Hernia repair      RT groin  . Appendectomy    . Treatment fistula anal    . Hemorrhoid banding  06/24/13    Dr. Ludwig Lean  . Hemorrhoid banding  07/12/2013  . Right shoulder surgery  03/06/2014    Dr. Tamera Punt     Social History   Social History  . Marital Status: Married    Spouse Name: N/A  . Number of Children: 3   . Years of Education: N/A   Occupational History  .  [place] History Main Topics  . Smoking status: Former Smoker    Types: Cigarettes    Quit date: 06/27/1988  . Smokeless tobacco: Never Used  . Alcohol Use: Yes  . Drug Use: No  . Sexual Activity: Not on file   Other Topics Concern  . Not on file   Social History Narrative   Started some exercise. Occ caffeine but not daily.     Family History  Problem Relation Age of Onset  . Breast cancer Mother 36  . Alcohol abuse Maternal Uncle   . Heart attack Maternal Uncle   . Stroke Maternal Uncle   . Lung cancer Maternal Grandfather   . Diabetes Maternal Aunt   . Hypertension Brother   . Hypertension Sister   . Diabetes Sister    . Uterine cancer Mother     Outpatient Encounter Prescriptions as of 09/04/2015  Medication Sig  . Blood Glucose Monitoring Suppl (CVS BLOOD GLUCOSE METER) w/Device KIT USE AS DIRECTED ONCE A DAY  . CVS ADVANCED GLUCOSE TEST test strip USE AS DIRECTED ONCE A DAY  . CVS LANCETS ULTRA-THIN 30G MISC USE AS DIRECTED ONCE A DAY  . metFORMIN (GLUCOPHAGE) 500 MG tablet Take 1 tablet (500 mg total) by mouth 2 (two) times daily with a meal.   No facility-administered encounter medications on file as of 09/04/2015.          Objective:   Physical Exam  Constitutional: He is oriented to person, place, and time. He appears well-developed and well-nourished.  HENT:  Head: Normocephalic and atraumatic.  Cardiovascular: Normal rate, regular rhythm and normal heart sounds.   Pulmonary/Chest: Effort normal and breath sounds normal.  Abdominal: Soft. Bowel sounds are normal. He exhibits no distension and no mass. There is tenderness. There is no rebound and no guarding.  TTP in the RLQ and the periumbilical area.   Neurological: He  is alert and oriented to person, place, and time.  Skin: Skin is warm and dry.  Psychiatric: He has a normal mood and affect. His behavior is normal.          Assessment & Plan:  RLQ pain - He recently was diagnosed with diverticulitis and actually has follow-up colonoscopy 3 weeks ago. His initial episode resulted in a hospitalization with a perforation. He was quite sick for several weeks. I would prefer to get a CT to confirm the diagnosis before putting him on antibiotics. Will get a stat CBC and CMP as well. We will likely need to put him on a second round of antibiotics. If he skips diverticulitis frequently then we may need to consider referral back to surgery for partial colectomy.

## 2015-09-05 ENCOUNTER — Telehealth: Payer: Self-pay | Admitting: Family Medicine

## 2015-09-05 MED ORDER — METRONIDAZOLE 500 MG PO TABS: 500.0000 mg | ORAL_TABLET | Freq: Three times a day (TID) | ORAL | Status: DC

## 2015-09-05 MED ORDER — CIPROFLOXACIN HCL 500 MG PO TABS: 500.0000 mg | ORAL_TABLET | Freq: Two times a day (BID) | ORAL | Status: DC

## 2015-09-07 ENCOUNTER — Encounter: Payer: Self-pay | Admitting: *Deleted

## 2015-09-08 ENCOUNTER — Ambulatory Visit: Payer: Managed Care, Other (non HMO) | Admitting: Family Medicine

## 2015-09-08 NOTE — Telephone Encounter (Signed)
Spoke with patient about lab results. Prescriptions sent to the pharmacy.

## 2015-09-29 ENCOUNTER — Encounter: Payer: Self-pay | Admitting: Family Medicine

## 2015-09-29 ENCOUNTER — Ambulatory Visit (INDEPENDENT_AMBULATORY_CARE_PROVIDER_SITE_OTHER): Payer: Managed Care, Other (non HMO) | Admitting: Family Medicine

## 2015-09-29 VITALS — BP 107/49 | HR 95 | Wt 280.0 lb

## 2015-09-29 DIAGNOSIS — K5732 Diverticulitis of large intestine without perforation or abscess without bleeding: Secondary | ICD-10-CM | POA: Diagnosis not present

## 2015-09-29 LAB — CBC WITH DIFFERENTIAL/PLATELET
BASOS PCT: 0 %
Basophils Absolute: 0 cells/uL (ref 0–200)
EOS PCT: 2 %
Eosinophils Absolute: 152 cells/uL (ref 15–500)
HCT: 44.8 % (ref 38.5–50.0)
Hemoglobin: 15.3 g/dL (ref 13.2–17.1)
LYMPHS PCT: 32 %
Lymphs Abs: 2432 cells/uL (ref 850–3900)
MCH: 29.6 pg (ref 27.0–33.0)
MCHC: 34.2 g/dL (ref 32.0–36.0)
MCV: 86.7 fL (ref 80.0–100.0)
MONO ABS: 836 {cells}/uL (ref 200–950)
MONOS PCT: 11 %
MPV: 11.1 fL (ref 7.5–12.5)
Neutro Abs: 4180 cells/uL (ref 1500–7800)
Neutrophils Relative %: 55 %
PLATELETS: 266 10*3/uL (ref 140–400)
RBC: 5.17 MIL/uL (ref 4.20–5.80)
RDW: 13.9 % (ref 11.0–15.0)
WBC: 7.6 10*3/uL (ref 3.8–10.8)

## 2015-09-29 MED ORDER — SULFAMETHOXAZOLE-TRIMETHOPRIM 800-160 MG PO TABS: 1.0000 | ORAL_TABLET | Freq: Two times a day (BID) | ORAL | Status: DC

## 2015-09-29 MED ORDER — METRONIDAZOLE 500 MG PO TABS: 500.0000 mg | ORAL_TABLET | Freq: Three times a day (TID) | ORAL | Status: DC

## 2015-09-29 NOTE — Patient Instructions (Signed)
Diverticulitis °Diverticulitis is inflammation or infection of small pouches in your colon that form when you have a condition called diverticulosis. The pouches in your colon are called diverticula. Your colon, or large intestine, is where water is absorbed and stool is formed. °Complications of diverticulitis can include: °· Bleeding. °· Severe infection. °· Severe pain. °· Perforation of your colon. °· Obstruction of your colon. °CAUSES  °Diverticulitis is caused by bacteria. °Diverticulitis happens when stool becomes trapped in diverticula. This allows bacteria to grow in the diverticula, which can lead to inflammation and infection. °RISK FACTORS °People with diverticulosis are at risk for diverticulitis. Eating a diet that does not include enough fiber from fruits and vegetables may make diverticulitis more likely to develop. °SYMPTOMS  °Symptoms of diverticulitis may include: °· Abdominal pain and tenderness. The pain is normally located on the left side of the abdomen, but may occur in other areas. °· Fever and chills. °· Bloating. °· Cramping. °· Nausea. °· Vomiting. °· Constipation. °· Diarrhea. °· Blood in your stool. °DIAGNOSIS  °Your health care provider will ask you about your medical history and do a physical exam. You may need to have tests done because many medical conditions can cause the same symptoms as diverticulitis. Tests may include: °· Blood tests. °· Urine tests. °· Imaging tests of the abdomen, including X-rays and CT scans. °When your condition is under control, your health care provider may recommend that you have a colonoscopy. A colonoscopy can show how severe your diverticula are and whether something else is causing your symptoms. °TREATMENT  °Most cases of diverticulitis are mild and can be treated at home. Treatment may include: °· Taking over-the-counter pain medicines. °· Following a clear liquid diet. °· Taking antibiotic medicines by mouth for 7-10 days. °More severe cases may  be treated at a hospital. Treatment may include: °· Not eating or drinking. °· Taking prescription pain medicine. °· Receiving antibiotic medicines through an IV tube. °· Receiving fluids and nutrition through an IV tube. °· Surgery. °HOME CARE INSTRUCTIONS  °· Follow your health care provider's instructions carefully. °· Follow a full liquid diet or other diet as directed by your health care provider. After your symptoms improve, your health care provider may tell you to change your diet. He or she may recommend you eat a high-fiber diet. Fruits and vegetables are good sources of fiber. Fiber makes it easier to pass stool. °· Take fiber supplements or probiotics as directed by your health care provider. °· Only take medicines as directed by your health care provider. °· Keep all your follow-up appointments. °SEEK MEDICAL CARE IF:  °· Your pain does not improve. °· You have a hard time eating food. °· Your bowel movements do not return to normal. °SEEK IMMEDIATE MEDICAL CARE IF:  °· Your pain becomes worse. °· Your symptoms do not get better. °· Your symptoms suddenly get worse. °· You have a fever. °· You have repeated vomiting. °· You have bloody or black, tarry stools. °MAKE SURE YOU:  °· Understand these instructions. °· Will watch your condition. °· Will get help right away if you are not doing well or get worse. °  °This information is not intended to replace advice given to you by your health care provider. Make sure you discuss any questions you have with your health care provider. °  °Document Released: 03/23/2005 Document Revised: 06/18/2013 Document Reviewed: 05/08/2013 °Elsevier Interactive Patient Education ©2016 Elsevier Inc. ° °

## 2015-09-29 NOTE — Progress Notes (Signed)
Subjective:    Patient ID: Frederick Yang, male    DOB: May 17, 1961, 55 y.o.   MRN: [id number]  HPI  A week after completed his antibiotics for an acute episode of diverticulitis,  he started having abdominal pain more on the left than the right.  Can last seconds to minutes. Rates it as 9/10.  + dizziness.  Waking him it up anight.  No fever, chills.  No change in BM. No urinary sxs. No  blood in stool. No vomiting. + nausea.  Getting urge for BM but then doesn't go. Seems worse on empty stomach.  sxs are the same as when has had diverticulitis.   Review of Systems   BP 107/49 mmHg  Pulse 95  Wt 280 lb (127.007 kg)  SpO2 98%    No Known Allergies  Past Medical History  Diagnosis Date  . Prostate hypertrophy   . Hyperlipidemia   . GERD (gastroesophageal reflux disease)   . Diabetes mellitus without complication [facility])     Past Surgical History  Procedure Laterality Date  . Hernia repair      RT groin  . Appendectomy    . Treatment fistula anal    . Hemorrhoid banding  06/24/13    Dr. Ludwig Lean  . Hemorrhoid banding  07/12/2013  . Right shoulder surgery  03/06/2014    Dr. Tamera Punt     Social History   Social History  . Marital Status: Married    Spouse Name: N/A  . Number of Children: 3   . Years of Education: N/A   Occupational History  .  [place] History Main Topics  . Smoking status: Former Smoker    Types: Cigarettes    Quit date: 06/27/1988  . Smokeless tobacco: Never Used  . Alcohol Use: Yes  . Drug Use: No  . Sexual Activity: Not on file   Other Topics Concern  . Not on file   Social History Narrative   Started some exercise. Occ caffeine but not daily.     Family History  Problem Relation Age of Onset  . Breast cancer Mother 10  . Alcohol abuse Maternal Uncle   . Heart attack Maternal Uncle   . Stroke Maternal Uncle   . Lung cancer Maternal Grandfather   . Diabetes Maternal Aunt   . Hypertension Brother   .  Hypertension Sister   . Diabetes Sister   . Uterine cancer Mother     Outpatient Encounter Prescriptions as of 09/29/2015  Medication Sig  . Blood Glucose Monitoring Suppl (CVS BLOOD GLUCOSE METER) w/Device KIT USE AS DIRECTED ONCE A DAY  . CVS ADVANCED GLUCOSE TEST test strip USE AS DIRECTED ONCE A DAY  . CVS LANCETS ULTRA-THIN 30G MISC USE AS DIRECTED ONCE A DAY  . metFORMIN (GLUCOPHAGE) 500 MG tablet Take 1 tablet (500 mg total) by mouth 2 (two) times daily with a meal.  . metroNIDAZOLE (FLAGYL) 500 MG tablet Take 1 tablet (500 mg total) by mouth every 8 (eight) hours.  Marland Kitchen sulfamethoxazole-trimethoprim (BACTRIM DS,SEPTRA DS) 800-160 MG tablet Take 1 tablet by mouth 2 (two) times daily.  . [DISCONTINUED] ciprofloxacin (CIPRO) 500 MG tablet Take 1 tablet (500 mg total) by mouth 2 (two) times daily.  . [DISCONTINUED] metroNIDAZOLE (FLAGYL) 500 MG tablet Take 1 tablet (500 mg total) by mouth every 8 (eight) hours.   No facility-administered encounter medications on file as of 09/29/2015.          Objective:  Physical Exam  Constitutional: He is oriented to person, place, and time. He appears well-developed and well-nourished.  HENT:  Head: Normocephalic and atraumatic.  Cardiovascular: Normal rate, regular rhythm and normal heart sounds.   Pulmonary/Chest: Effort normal and breath sounds normal.  Abdominal: Soft. Bowel sounds are normal. He exhibits no distension and no mass. There is tenderness. There is no rebound and no guarding.  + TTP in the LLQ  Neurological: He is alert and oriented to person, place, and time.  Skin: Skin is warm and dry.  Psychiatric: He has a normal mood and affect. His behavior is normal.          Assessment & Plan:  Recurrent diverticulitis-this is his third episode in 4 months. We'll go ahead and put him on metronidazole and instead of Cipro this time are going to use Bactrim. If he is not getting better in the next few days then please have him call  me back. Work on getting him in with GI, he sees Dr. Harrell Gave chew. At this point unfortunately I think they may need to have a discussion about surgical treatment with partial colectomy. In the meantime continue with high-fiber diet.

## 2015-09-29 NOTE — Addendum Note (Signed)
Addended by: Teddy Spike on: 09/29/2015 04:18 PM   Modules accepted: Orders

## 2015-10-05 ENCOUNTER — Ambulatory Visit (INDEPENDENT_AMBULATORY_CARE_PROVIDER_SITE_OTHER): Payer: Managed Care, Other (non HMO) | Admitting: Family Medicine

## 2015-10-05 ENCOUNTER — Encounter: Payer: Self-pay | Admitting: Family Medicine

## 2015-10-05 VITALS — BP 112/63 | HR 83 | Wt 280.0 lb

## 2015-10-05 DIAGNOSIS — K5792 Diverticulitis of intestine, part unspecified, without perforation or abscess without bleeding: Secondary | ICD-10-CM

## 2015-10-05 MED ORDER — CIPROFLOXACIN HCL 500 MG PO TABS: 500.0000 mg | ORAL_TABLET | Freq: Two times a day (BID) | ORAL | Status: AC

## 2015-10-05 NOTE — Progress Notes (Signed)
   Subjective:    Patient ID: Frederick Yang, male    DOB: 10/09/1960, 55 y.o.   MRN: [id number]  HPI Patient is here today to follow-up on diverticulitis. I saw him 6 days ago. Unfortunately this was his third episode of diverticulitis. We did not get imaging this time. His vitals were stable but his symptoms were consistent so we decided to go ahead and treat him with metronidazole and this time instead of Cipro decided to use Bactrim. Unfortunately he really hasn't gotten any better in the 6 days. He said previously A1c down antibiotics he started to feel better within 2-3 days. He said he's had persistent nausea. Some intermittent cramping especially if he doesn't eat or if he is about to have a bowel movement. It seems to get worse. He feels like he might even be getting a little bit worse as now is starting to get pain radiating into the left testicle similar to the first episode that he had. We did get him an appointment with GI but it's not until next week.   Review of Systems     Objective:   Physical Exam  Constitutional: He is oriented to person, place, and time. He appears well-developed and well-nourished.  HENT:  Head: Normocephalic and atraumatic.  Cardiovascular: Normal rate, regular rhythm and normal heart sounds.   Pulmonary/Chest: Effort normal and breath sounds normal.  Abdominal: Soft. Bowel sounds are normal. He exhibits no distension and no mass. There is tenderness. There is no rebound and no guarding.  +TTP in the LLQ.    Neurological: He is alert and oriented to person, place, and time.  Skin: Skin is warm and dry.  Psychiatric: He has a normal mood and affect. His behavior is normal.          Assessment & Plan:   Acute diverticulitis-we'll go ahead and discontinue the Bactrim and switch him to Cipro. We did give him 2 g of IM Rocephin today here in the office. Continue metronidazole. If no significant improvement in the next 2-3 days and will move forward  with CT of abdomen and pelvis with contrast for further evaluation and to rule out abscess.

## 2015-10-05 NOTE — Patient Instructions (Signed)
Stop the sulfamethoxazole (Bactrim). He will start Cipro in its place. Continue the metronidazole.  Call if suddenly gets worse or if no improvement in the next 48 hours and we will move forward with CT of the abdomen and pelvis with contrast evaluate for abscess.

## 2015-10-06 DIAGNOSIS — K5792 Diverticulitis of intestine, part unspecified, without perforation or abscess without bleeding: Secondary | ICD-10-CM | POA: Diagnosis not present

## 2015-10-06 MED ORDER — CEFTRIAXONE SODIUM 1 G IJ SOLR
1.0000 g | Freq: Once | INTRAMUSCULAR | Status: AC
  Administered 2015-10-06: 1 g via INTRAMUSCULAR

## 2015-10-06 NOTE — Addendum Note (Signed)
Addended by: Teddy Spike on: 10/06/2015 04:57 PM   Modules accepted: Orders

## 2015-10-12 ENCOUNTER — Telehealth: Payer: Self-pay

## 2015-10-12 NOTE — Telephone Encounter (Signed)
Frederick Yang state his diverticulitis is still acting up. He was put on flagyl and cipro by Dr Madilyn Fireman last week. He then was switched to amoxicillin by Dr Cindee Salt. He is not feeling any better. Please advise.

## 2015-10-13 NOTE — Telephone Encounter (Signed)
Restart the metronidazole with the Augmentin. If he needs a new prescription for the metronidazole then okay to refill.

## 2015-10-13 NOTE — Telephone Encounter (Signed)
Patient advised.

## 2015-10-14 ENCOUNTER — Telehealth: Payer: Self-pay | Admitting: *Deleted

## 2015-10-14 MED ORDER — METRONIDAZOLE 500 MG PO TABS: 500.0000 mg | ORAL_TABLET | Freq: Three times a day (TID) | ORAL | Status: DC

## 2015-10-14 NOTE — Telephone Encounter (Signed)
Called pt back he stated that he did get his results back. And was told that he thickening and diverticulitis. Pt stated that he has a f/u appt on Friday. He was advised to take the Amox and metronidazole.[name] [place]

## 2015-10-14 NOTE — Telephone Encounter (Signed)
Pt called and lvm stating that he has not heard any thing about the CT results. Also he is still hurting and in pain since Sunday.[name] [place]

## 2015-10-26 ENCOUNTER — Ambulatory Visit (INDEPENDENT_AMBULATORY_CARE_PROVIDER_SITE_OTHER): Payer: Managed Care, Other (non HMO) | Admitting: Family Medicine

## 2015-10-26 ENCOUNTER — Encounter: Payer: Self-pay | Admitting: Family Medicine

## 2015-10-26 VITALS — BP 105/55 | HR 70 | Temp 98.6°F | Wt 282.0 lb

## 2015-10-26 DIAGNOSIS — R110 Nausea: Secondary | ICD-10-CM | POA: Diagnosis not present

## 2015-10-26 DIAGNOSIS — K5732 Diverticulitis of large intestine without perforation or abscess without bleeding: Secondary | ICD-10-CM | POA: Diagnosis not present

## 2015-10-26 MED ORDER — ONDANSETRON 4 MG PO TBDP: 4.0000 mg | ORAL_TABLET | Freq: Three times a day (TID) | ORAL | Status: DC | PRN

## 2015-10-26 MED ORDER — RANITIDINE HCL 150 MG PO TABS: 150.0000 mg | ORAL_TABLET | Freq: Two times a day (BID) | ORAL | Status: DC

## 2015-10-26 NOTE — Progress Notes (Signed)
Subjective:    Patient ID: Frederick Yang, male    DOB: [birth year], 55 y.o.   MRN: [id number]  HPI  Is here to follow-up for diverticulitis. He did see GI. He is currently on amoxicillin and Flagyl and has about 2 more days left. They have actually referred him to a surgeon to discuss partial colectomy. He saw the surgeon last Friday and they are going to schedule him probably the first week of June for surgery that was the first available. He is here today primarily because of nausea. It has been intermittent well during this last episode of diverticulitis but over the last 3 days it has been constant. He has not taken any medication for. He denies any vomiting. He says his him is to the point where he feels shaky and jittery inside. He denies any fever. He says he's had a little bit of left lower quadrant pain and occasional spasms but has been taking an antispasmodic prescribed by the GI doctor and says that has helped. No new abdominal pain. No change in stools. His last CT scan of the abdomen was April 18 and it did confirm colitis.  Review of Systems  BP 105/55 mmHg  Pulse 70  Temp(Src) 98.6 F (37 C)  Wt 282 lb (127.914 kg)  SpO2 100%    No Known Allergies  Past Medical History  Diagnosis Date  . Prostate hypertrophy   . Hyperlipidemia   . GERD (gastroesophageal reflux disease)   . Diabetes mellitus without complication [facility])     Past Surgical History  Procedure Laterality Date  . Hernia repair      RT groin  . Appendectomy    . Treatment fistula anal    . Hemorrhoid banding  06/24/13    Dr. Ludwig Lean  . Hemorrhoid banding  07/12/2013  . Right shoulder surgery  03/06/2014    Dr. Tamera Punt     Social History   Social History  . Marital Status: Married    Spouse Name: N/A  . Number of Children: 3   . Years of Education: N/A   Occupational History  .  [place] History Main Topics  . Smoking status: Former Smoker    Types: Cigarettes    Quit  date: 06/27/1988  . Smokeless tobacco: Never Used  . Alcohol Use: Yes  . Drug Use: No  . Sexual Activity: Not on file   Other Topics Concern  . Not on file   Social History Narrative   Started some exercise. Occ caffeine but not daily.     Family History  Problem Relation Age of Onset  . Breast cancer Mother 66  . Alcohol abuse Maternal Uncle   . Heart attack Maternal Uncle   . Stroke Maternal Uncle   . Lung cancer Maternal Grandfather   . Diabetes Maternal Aunt   . Hypertension Brother   . Hypertension Sister   . Diabetes Sister   . Uterine cancer Mother     Outpatient Encounter Prescriptions as of 10/26/2015  Medication Sig  . dicyclomine (BENTYL) 20 MG tablet TAKE ONE BY MOUTH 3 TIMES A DAY FOR ABDOMINAL PAIN  . Blood Glucose Monitoring Suppl ([employer] BLOOD GLUCOSE METER) w/Device KIT USE AS DIRECTED ONCE A DAY  . [employer]S ADVANCED GLUCOSE TEST test strip USE AS DIRECTED ONCE A DAY  . [employer]S LANCETS ULTRA-THIN 30G MISC USE AS DIRECTED ONCE A DAY  . metFORMIN (GLUCOPHAGE) 500 MG tablet Take 1 tablet (500 mg  total) by mouth 2 (two) times daily with a meal.  . ondansetron (ZOFRAN ODT) 4 MG disintegrating tablet Take 1 tablet (4 mg total) by mouth every 8 (eight) hours as needed for nausea or vomiting.  . ranitidine (ZANTAC) 150 MG tablet Take 1 tablet (150 mg total) by mouth 2 (two) times daily.  . [DISCONTINUED] metroNIDAZOLE (FLAGYL) 500 MG tablet Take 1 tablet (500 mg total) by mouth every 8 (eight) hours.   No facility-administered encounter medications on file as of 10/26/2015.          Objective:   Physical Exam  Constitutional: He is oriented to person, place, and time. He appears well-developed and well-nourished.  HENT:  Head: Normocephalic and atraumatic.  Cardiovascular: Normal rate, regular rhythm and normal heart sounds.   Pulmonary/Chest: Effort normal and breath sounds normal.  Abdominal: Soft. Bowel sounds are normal. He exhibits no distension and no mass. There is  tenderness. There is no rebound and no guarding.  Mild tender to palpation in the epigastric area and the left lower quadrant.  Neurological: He is alert and oriented to person, place, and time.  Skin: Skin is warm and dry.  Psychiatric: He has a normal mood and affect. His behavior is normal.         Assessment & Plan:  Diverticulitis-complete antibiotics. Will have surgery scheduled in about 4-5 weeks.  Nausea-unclear if related to the diverticulitis itself or recurrent use of antibiotics. He is actually finishing his amoxicillin today and has 1 or 2 more days on the Flagyl. Encouraged him to finish those out. Will give a prescription for Zofran to use as needed. Hemoglobin and start him on Zantac. We'll also check a CBC today. I want to hold off on any repeat CT scan at this time. With no rebound or guarding or fever think is unlikely that the diverticulitis has progressed. Next  Also needs a letter for work stating that he is only able to work 40 hours per week.

## 2015-10-27 LAB — CBC WITH DIFFERENTIAL/PLATELET
BASOS ABS: 72 {cells}/uL (ref 0–200)
Basophils Relative: 1 %
EOS ABS: 144 {cells}/uL (ref 15–500)
Eosinophils Relative: 2 %
HCT: 44.8 % (ref 38.5–50.0)
HEMOGLOBIN: 14.7 g/dL (ref 13.2–17.1)
LYMPHS ABS: 2736 {cells}/uL (ref 850–3900)
Lymphocytes Relative: 38 %
MCH: 28.3 pg (ref 27.0–33.0)
MCHC: 32.8 g/dL (ref 32.0–36.0)
MCV: 86.2 fL (ref 80.0–100.0)
MONO ABS: 648 {cells}/uL (ref 200–950)
MPV: 10.8 fL (ref 7.5–12.5)
Monocytes Relative: 9 %
NEUTROS ABS: 3600 {cells}/uL (ref 1500–7800)
NEUTROS PCT: 50 %
PLATELETS: 277 10*3/uL (ref 140–400)
RBC: 5.2 MIL/uL (ref 4.20–5.80)
RDW: 13.9 % (ref 11.0–15.0)
WBC: 7.2 10*3/uL (ref 3.8–10.8)

## 2015-10-27 LAB — COMPLETE METABOLIC PANEL WITH GFR
ALBUMIN: 4 g/dL (ref 3.6–5.1)
ALK PHOS: 59 U/L (ref 40–115)
ALT: 14 U/L (ref 9–46)
AST: 14 U/L (ref 10–35)
BUN: 14 mg/dL (ref 7–25)
CO2: 25 mmol/L (ref 20–31)
Calcium: 9.4 mg/dL (ref 8.6–10.3)
Chloride: 103 mmol/L (ref 98–110)
Creat: 0.83 mg/dL (ref 0.70–1.33)
GLUCOSE: 90 mg/dL (ref 65–99)
Potassium: 4.7 mmol/L (ref 3.5–5.3)
SODIUM: 137 mmol/L (ref 135–146)
Total Bilirubin: 0.3 mg/dL (ref 0.2–1.2)
Total Protein: 7.4 g/dL (ref 6.1–8.1)

## 2015-11-26 HISTORY — PX: COLON RESECTION SIGMOID: SHX6737

## 2015-12-04 ENCOUNTER — Ambulatory Visit: Payer: Managed Care, Other (non HMO) | Admitting: Family Medicine

## 2016-02-01 ENCOUNTER — Encounter: Payer: Self-pay | Admitting: Family Medicine

## 2016-02-01 ENCOUNTER — Ambulatory Visit (INDEPENDENT_AMBULATORY_CARE_PROVIDER_SITE_OTHER): Payer: Managed Care, Other (non HMO) | Admitting: Family Medicine

## 2016-02-01 VITALS — BP 125/71 | HR 75 | Wt 287.0 lb

## 2016-02-01 DIAGNOSIS — Z6841 Body Mass Index (BMI) 40.0 and over, adult: Secondary | ICD-10-CM

## 2016-02-01 DIAGNOSIS — E119 Type 2 diabetes mellitus without complications: Secondary | ICD-10-CM | POA: Diagnosis not present

## 2016-02-01 DIAGNOSIS — L081 Erythrasma: Secondary | ICD-10-CM | POA: Diagnosis not present

## 2016-02-01 LAB — POCT UA - MICROALBUMIN
Albumin/Creatinine Ratio, Urine, POC: <30
Creatinine, POC: 300 mg/dL
Microalbumin Ur, POC: 30 mg/L

## 2016-02-01 LAB — POCT GLYCOSYLATED HEMOGLOBIN (HGB A1C): Hemoglobin A1C: 6.1

## 2016-02-01 MED ORDER — ERYTHROMYCIN 2 % EX GEL: Freq: Every day | CUTANEOUS | 1 refills | Status: DC

## 2016-02-01 MED ORDER — FLUCONAZOLE 150 MG PO TABS: 150.0000 mg | ORAL_TABLET | ORAL | 0 refills | Status: DC

## 2016-02-01 MED ORDER — ERYTHROMYCIN 2 % EX OINT: 1.0000 "application " | TOPICAL_OINTMENT | Freq: Every day | CUTANEOUS | 1 refills | Status: DC

## 2016-02-01 MED ORDER — BENZOYL PEROXIDE 5 % EX LIQD: Freq: Two times a day (BID) | CUTANEOUS | 12 refills | Status: DC

## 2016-02-01 NOTE — Progress Notes (Signed)
Subjective:    CC:   HPI:  Subjective:    CC:   HPI:  Diabetes - no hypoglycemic events. No wounds or sores that are not healing well. No increased thirst or urination. Checking glucose at home. He decided to stop his metformin on his own. He had stopped it temporarily while having problems with his diverticulitis and decided to hold off and really stress his diet. He has not been able to exercise for couple months because of his surgery but says in the last couple weeks he has started getting into her routine and is going to be working with the professional for weight loss starting next week.  Rash under left axilla - He had a rash under the arm for a couple of weeks. In fact he quit using deodorant about 2 weeks ago to see if that was causing the irritation. He's been using a prescription cream that belongs to his wife. He believes it starts with a T-cell I suspect it may be triamcinolone. Though he is not sure. He says he is noticed a few bumps popping her up around the edge of the rash over the last few days. He denies any other recent changes such as deodorant perfume colognes etc.  Past medical history, Surgical history, Family history not pertinant except as noted below, Social history, Allergies, and medications have been entered into the medical record, reviewed, and corrections made.   Review of Systems: No fevers, chills, night sweats, weight loss, chest pain, or shortness of breath.   Objective:    General: Well Developed, well nourished, and in no acute distress.  Neuro: Alert and oriented x3, extra-ocular muscles intact, sensation grossly intact.  HEENT: Normocephalic, atraumatic  Skin: Warm and dry, He does have a hyperpigmented rash under the left axilla. Edematous has a white appearance in the center and does have a few erythematous papular lesions around the edge. There is some thicker scale that he does have a cream on it currently.. Cardiac: Regular rate and rhythm, no  murmurs rubs or gallops, no lower extremity edema.  Respiratory: Clear to auscultation bilaterally. Not using accessory muscles, speaking in full sentences.   Impression and Recommendations:   Diabetes-we discussed the pros and cons of stopping metformin prematurely. At this point he is opting to discontinue it and really focusing on diet and exercise. Next  BMI 44-continue to work on diet and exercise and weight loss. He will be working with the professional who will help him with diet and he is starting to get back into the gym now that he is recovering from partial colectomy surgery. Next  Erythrasma-discussed diagnosis. Will treat with until peroxide wash, oral Diflucan since he does have some satellite lesions on exam, and erythromycin ointment.

## 2016-02-01 NOTE — Patient Instructions (Signed)
Call if the rash isn't better in 2-3 weeks.

## 2016-02-03 LAB — FUNGAL STAIN

## 2016-03-25 LAB — HM DIABETES EYE EXAM

## 2016-04-27 ENCOUNTER — Ambulatory Visit: Payer: Managed Care, Other (non HMO) | Admitting: Sports Medicine

## 2016-04-27 ENCOUNTER — Emergency Department
Admission: EM | Admit: 2016-04-27 | Discharge: 2016-04-27 | Disposition: A | Discharge status: Home / Self Care | Payer: Managed Care, Other (non HMO) | Source: Home / Self Care | Attending: Family Medicine | Admitting: Family Medicine

## 2016-04-27 ENCOUNTER — Encounter: Payer: Self-pay | Admitting: *Deleted

## 2016-04-27 DIAGNOSIS — S76319A Strain of muscle, fascia and tendon of the posterior muscle group at thigh level, unspecified thigh, initial encounter: Secondary | ICD-10-CM | POA: Diagnosis not present

## 2016-04-27 NOTE — ED Provider Notes (Signed)
[facility]    CSN: [id number] Arrival date & time: 04/27/16  0955     History   Chief Complaint Chief Complaint  Patient presents with  . Leg Pain    HPI Frederick Yang is a 55 y.o. male.   Patient reports that he has been working out regularly.  Yesterday evening he ran some sprints (which he does not usually do) and felt/heard a popping sensation in the posterior aspect of his left thigh.  He has had persistent pain when flexing his left knee, walking, and squatting.   The history is provided by the patient and the spouse.  Leg Pain  Location:  Leg Time since incident:  1 day Leg location:  L upper leg Pain details:    Quality:  Aching   Radiates to:  Does not radiate   Severity:  Mild   Onset quality:  Sudden   Duration:  1 day   Timing:  Constant   Progression:  Unchanged Chronicity:  New Prior injury to area:  No Relieved by: Pain medication. Worsened by:  Flexion and activity Associated symptoms: stiffness   Associated symptoms: no back pain, no decreased ROM, no muscle weakness, no numbness, no swelling and no tingling   Risk factors: obesity     Past Medical History:  Diagnosis Date  . Diabetes mellitus without complication ([place])   . GERD (gastroesophageal reflux disease)   . Hyperlipidemia   . Prostate hypertrophy     Patient Active Problem List   Diagnosis Date Noted  . Diverticulitis of large intestine with perforation without bleeding   . Diverticulitis of sigmoid colon 06/05/2015  . Diabetes mellitus type 2, controlled ([place]) 05/28/2015  . Osteoarthritis of left midfoot 11/03/2014  . Plantar fasciitis, bilateral 06/26/2014  . Severe obesity (BMI >= 40) ([place]) 03/22/2013  . Carpal tunnel syndrome of left wrist 03/18/2013  . Osteoarthritis, hand 03/18/2013  . Insertional Achilles tendinitis of left lower extremity 03/18/2013  . Impingement syndrome of right shoulder 09/10/2010  . BACK PAIN WITH RADICULOPATHY 12/31/2009  .  Hyperlipidemia 10/27/2006    Past Surgical History:  Procedure Laterality Date  . APPENDECTOMY    . HEMORRHOID BANDING  06/24/13   Dr. Ludwig Lean  . HEMORRHOID BANDING  07/12/2013  . HERNIA REPAIR     RT groin  . right shoulder surgery  03/06/2014   Dr. Tamera Punt   . TREATMENT FISTULA ANAL         Home Medications    Prior to Admission medications   Medication Sig Start Date End Date Taking? Authorizing Provider  benzoyl peroxide (BENZOYL PEROXIDE) 5 % external liquid Apply topically 2 (two) times daily. 02/01/16   Hali Marry, MD  Blood Glucose Monitoring Suppl ([employer]S BLOOD GLUCOSE METER) w/Device KIT USE AS DIRECTED ONCE A DAY 05/29/15   Historical Provider, MD  CVS ADVANCED GLUCOSE TEST test strip USE AS DIRECTED ONCE A DAY 05/29/15   Historical Provider, MD  CVS LANCETS ULTRA-THIN 30G MISC USE AS DIRECTED ONCE A DAY 05/29/15   Historical Provider, MD  erythromycin with ethanol (EMGEL) 2 % gel Apply topically daily. 02/01/16   Hali Marry, MD  metFORMIN (GLUCOPHAGE) 500 MG tablet Take 1 tablet (500 mg total) by mouth 2 (two) times daily with a meal. Patient not taking: Reported on 02/01/2016 05/28/15   Hali Marry, MD    Family History Family History  Problem Relation Age of Onset  . Breast cancer Mother 50  . Uterine  cancer Mother   . Alcohol abuse Maternal Uncle   . Heart attack Maternal Uncle   . Stroke Maternal Uncle   . Lung cancer Maternal Grandfather   . Diabetes Maternal Aunt   . Hypertension Brother   . Hypertension Sister   . Diabetes Sister     Social History Social History  Substance Use Topics  . Smoking status: Former Smoker    Types: Cigarettes    Quit date: 06/27/1988  . Smokeless tobacco: Never Used  . Alcohol use Yes     Allergies   Review of patient's allergies indicates no known allergies.   Review of Systems Review of Systems  Respiratory: Negative for cough, chest tightness, shortness of breath and wheezing.     Musculoskeletal: Positive for stiffness. Negative for back pain.  All other systems reviewed and are negative.    Physical Exam Triage Vital Signs ED Triage Vitals  Enc Vitals Group     BP 04/27/16 1024 120/76     Pulse Rate 04/27/16 1024 65     Resp 04/27/16 1024 18     Temp 04/27/16 1024 98.1 F (36.7 C)     Temp Source 04/27/16 1024 Oral     SpO2 04/27/16 1024 99 %     Weight 04/27/16 1025 259 lb (117.5 kg)     Height 04/27/16 1025 5' 7"  (1.702 m)     Head Circumference --      Peak Flow --      Pain Score 04/27/16 1026 3     Pain Loc --      Pain Edu? --      Excl. in [place]? --    No data found.   Updated Vital Signs BP 120/76 (BP Location: Left Arm)   Pulse 65   Temp 98.1 F (36.7 C) (Oral)   Resp 18   Ht 5' 7"  (1.702 m)   Wt 259 lb (117.5 kg)   SpO2 99%   BMI 40.57 kg/m   Visual Acuity Right Eye Distance:   Left Eye Distance:   Bilateral Distance:    Right Eye Near:   Left Eye Near:    Bilateral Near:     Physical Exam  Constitutional: He appears well-developed and well-nourished. No distress.  HENT:  Head: Normocephalic.  Eyes: Pupils are equal, round, and reactive to light.  Neck: Normal range of motion.  Cardiovascular: Normal heart sounds.   Pulmonary/Chest: Breath sounds normal.  Musculoskeletal: He exhibits no edema.       Left upper leg: He exhibits tenderness. He exhibits no bony tenderness, no swelling, no edema, no deformity and no laceration.       Legs: Left posterior thigh has tenderness to palpation as noted on diagram.  Pain is elicited by resisted flexion of the left knee while palpating left posterior thigh.  No swelling, erythema, or warmth.  Distal neurovascular function is intact.   Neurological: He is alert.  Skin: Skin is warm and dry.  Nursing note and vitals reviewed.    UC Treatments / Results  Labs (all labs ordered are listed, but only abnormal results are displayed) Labs Reviewed - No data to display  EKG  EKG  Interpretation None       Radiology No results found.  Procedures Procedures (including critical care time)  Medications Ordered in UC Medications - No data to display   Initial Impression / Assessment and Plan / UC Course  I have reviewed the triage vital signs and the  nursing notes.  Pertinent labs & imaging results that were available during my care of the patient were reviewed by me and considered in my medical decision making (see chart for details).  Clinical Course  Apply ice pack for 20 to 30 minutes, 3 to 4 times daily  Continue until pain decreases. Begin stretching and range of motion exercises as tolerated.  May take Ibuprofen 228m, 4 tabs every 8 hours with food.  Followup with Dr. TAundria Memsor Dr[facility] if not improving about two weeks.      Final Clinical Impressions(s) / UC Diagnoses   Final diagnoses:  Hamstring strain, initial encounter    New Prescriptions New Prescriptions   No medications on file     SKandra Nicolas MD 04/27/16 1046

## 2016-04-27 NOTE — ED Triage Notes (Signed)
Pt c/o LT leg pain x 1 day. He applied ice and took an old rx for hydrocodone last night.

## 2016-04-27 NOTE — Discharge Instructions (Signed)
Apply ice pack for 20 to 30 minutes, 3 to 4 times daily  Continue until pain decreases. Begin stretching and range of motion exercises as tolerated.  May take Ibuprofen 200mg , 4 tabs every 8 hours with food.

## 2016-04-28 ENCOUNTER — Ambulatory Visit: Payer: Managed Care, Other (non HMO) | Admitting: Sports Medicine

## 2016-05-03 ENCOUNTER — Encounter: Payer: Self-pay | Admitting: Family Medicine

## 2016-05-03 ENCOUNTER — Ambulatory Visit: Payer: Managed Care, Other (non HMO) | Admitting: Family Medicine

## 2016-05-03 ENCOUNTER — Ambulatory Visit (INDEPENDENT_AMBULATORY_CARE_PROVIDER_SITE_OTHER): Payer: Managed Care, Other (non HMO) | Admitting: Family Medicine

## 2016-05-03 VITALS — BP 108/59 | HR 64 | Wt 264.0 lb

## 2016-05-03 DIAGNOSIS — S76302A Unspecified injury of muscle, fascia and tendon of the posterior muscle group at thigh level, left thigh, initial encounter: Secondary | ICD-10-CM | POA: Diagnosis not present

## 2016-05-03 DIAGNOSIS — S76112A Strain of left quadriceps muscle, fascia and tendon, initial encounter: Secondary | ICD-10-CM

## 2016-05-03 DIAGNOSIS — E119 Type 2 diabetes mellitus without complications: Secondary | ICD-10-CM

## 2016-05-03 LAB — POCT GLYCOSYLATED HEMOGLOBIN (HGB A1C): Hemoglobin A1C: 5.3

## 2016-05-03 NOTE — Progress Notes (Signed)
Subjective:    CC: DM  HPI: Diabetes - no hypoglycemic events. No wounds or sores that are not healing well. No increased thirst or urination. Checking glucose at home. Taking medications as prescribed without any side effects. He has lost 25 lbs since august. He has been working out 5 days per week at [employer].  Feels like he is doing well on diet.    Left hamstring pull - He also went to discuss his left hamstring today. He was running and doing some exercises and sprints at the gym when he felt a sudden pull in the back of his left leg close to the knee. He came to be evaluated and was seen in urgent care. He was diagnosed with a left hamstring pull. Recommended anti-inflammatories I think and rest, he has not been back to the gym since. Is feeling somewhat better but still gets a little bit sore. He's also been having a little anterior thigh pain as well. He says initially it was swollen but that actually seems to have improved.   Past medical history, Surgical history, Family history not pertinant except as noted below, Social history, Allergies, and medications have been entered into the medical record, reviewed, and corrections made.   Review of Systems: No fevers, chills, night sweats, weight loss, chest pain, or shortness of breath.   Objective:    General: Well Developed, well nourished, and in no acute distress.  Neuro: Alert and oriented x3, extra-ocular muscles intact, sensation grossly intact.  HEENT: Normocephalic, atraumatic  Skin: Warm and dry, no rashes. Cardiac: Regular rate and rhythm, no murmurs rubs or gallops, no lower extremity edema.  Respiratory: Clear to auscultation bilaterally. Not using accessory muscles, speaking in full sentences. MSk: Hip, knee, ankle strength is 5 out of 5 bilaterally. Nontender over the hamstring or the quadricep. No swelling of the lower extremity. Nontender around the knee.   Impression and Recommendations:   DM- Controlled. He's  actually off of his metformin and his A1c is down to 5.3 today which looks fantastic. I'll see him back in 4 months. If his A1c is still at goal then then we can just start monitoring him every 6 months. Next  Left hamstring pull-continue with icing and anti-inflammatory as needed and we'll go ahead and start him on some rehabilitation exercises. Next  Quadricep strain-given exercises to do on his own at home as well.

## 2016-05-05 ENCOUNTER — Encounter: Payer: Managed Care, Other (non HMO) | Admitting: Family Medicine

## 2016-05-13 IMAGING — CR DG SHOULDER 2+V*R*
3 series · 3 of 3 positions shown · non-contrast
Comparison: None.

CLINICAL DATA: Pain

EXAM:
RIGHT SHOULDER - 2+ VIEW

[view not recorded (1 of 3)]
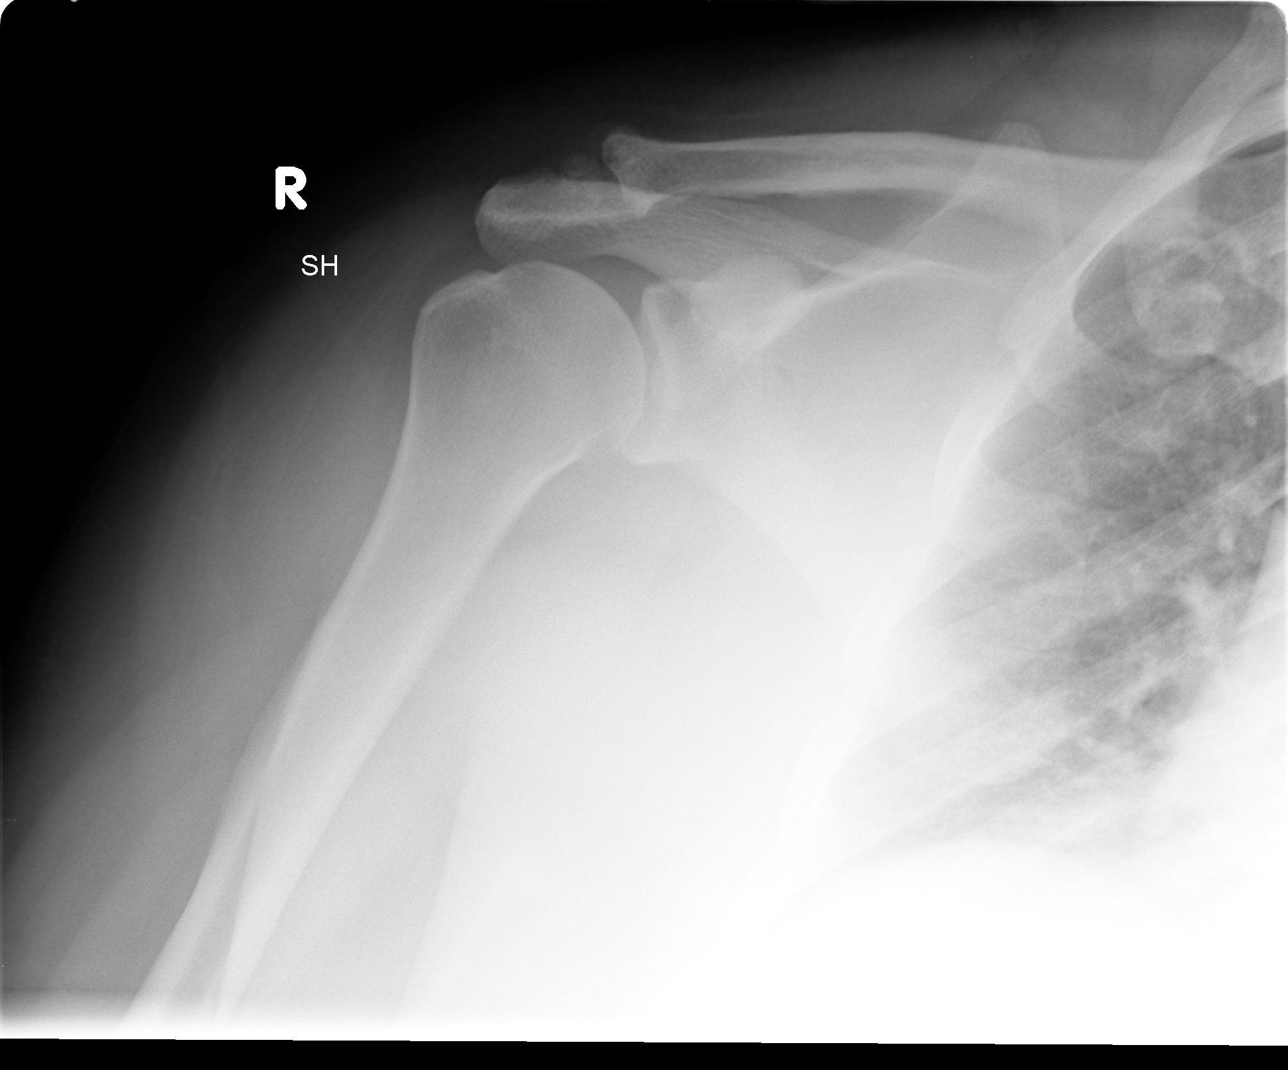

[view not recorded (2 of 3)]
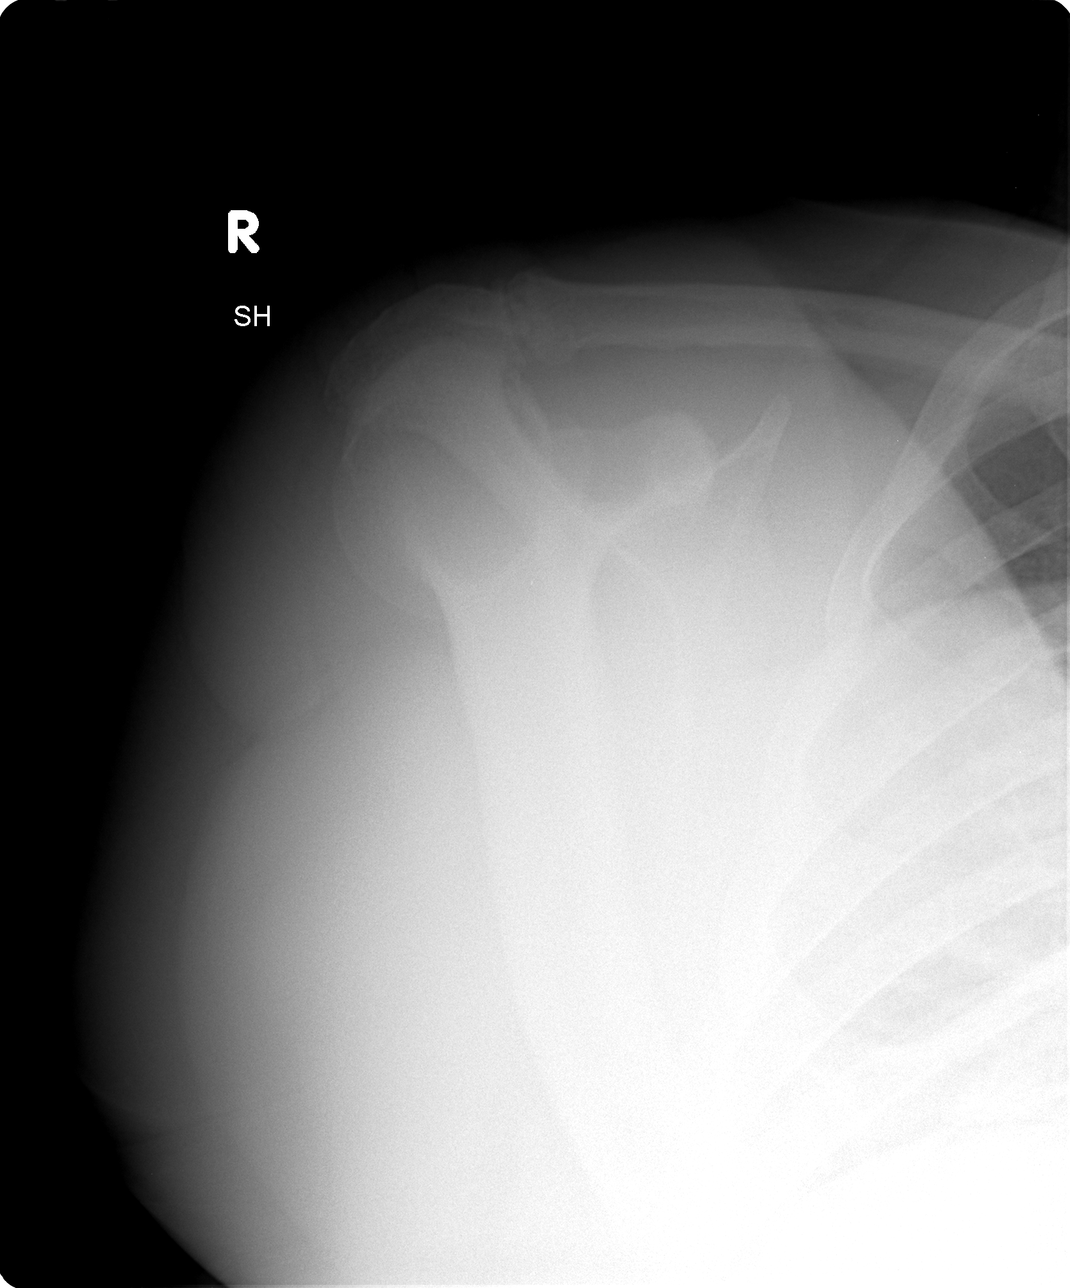

[view not recorded (3 of 3)]
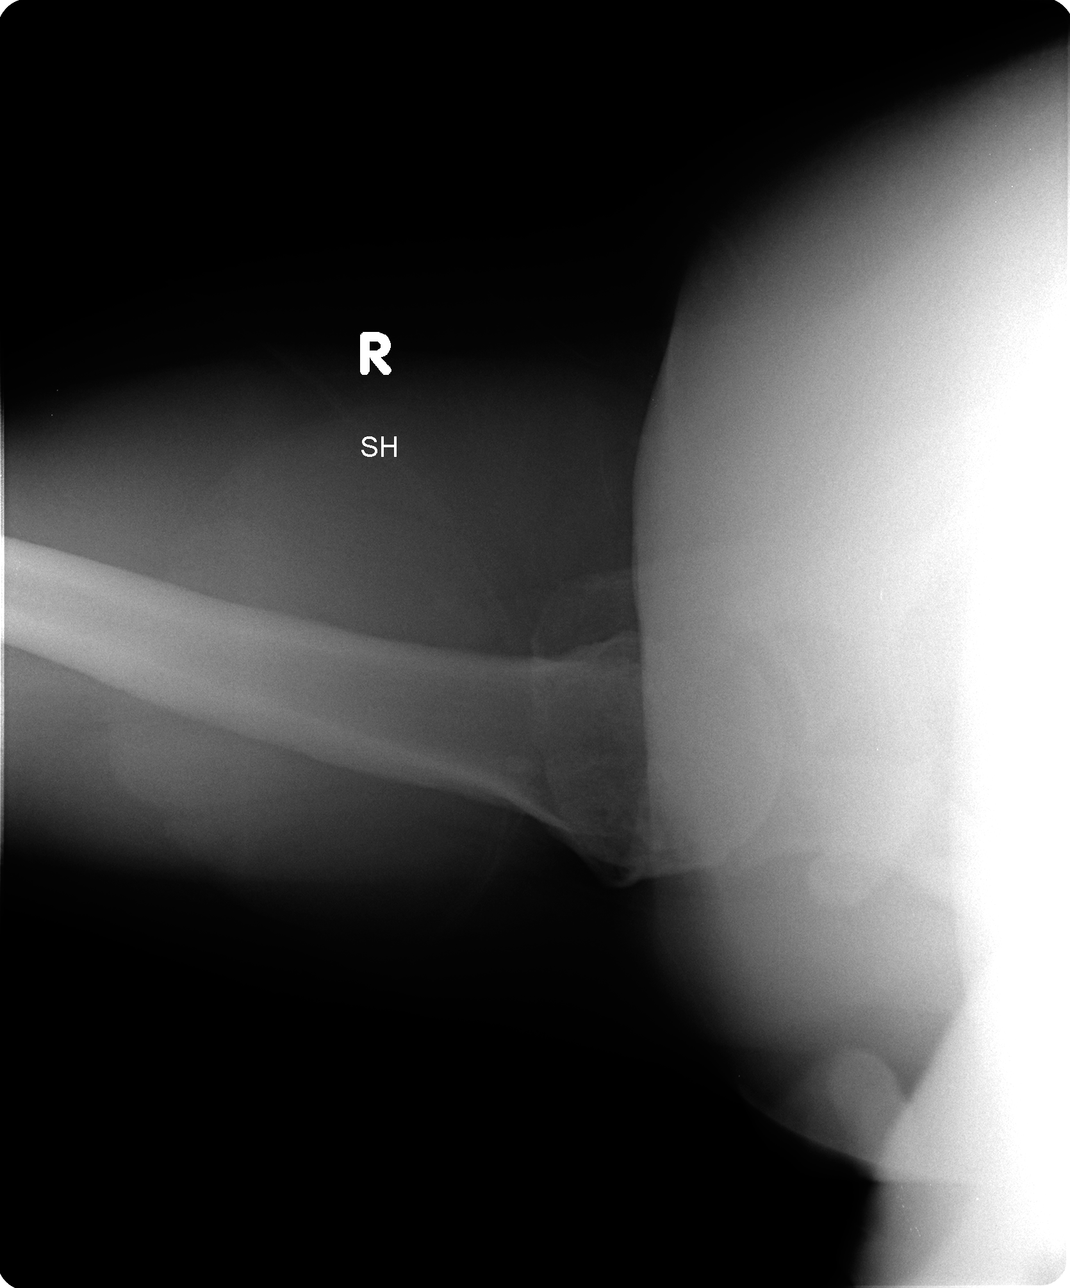

[3 of 3 positions shown; findings below may reference images not displayed]

FINDINGS: Frontal, Y scapular, and axillary views were obtained. There is mild
bony overgrowth the acromioclavicular joint. No fracture or
dislocation. There is no appreciable joint space narrowing. No
erosive change or intra-articular calcification.
IMPRESSION: Bony overgrowth of the acromioclavicular joint. No acute fracture or
dislocation. No erosive change.

Bony overgrowth at the acromioclavicular joint can potentially lead
to so-called impingement syndrome. MR would be the imaging study of
choice for further evaluation with respect to this entity.

## 2016-05-16 ENCOUNTER — Encounter: Payer: Self-pay | Admitting: Family Medicine

## 2016-05-16 ENCOUNTER — Telehealth: Payer: Self-pay | Admitting: Family Medicine

## 2016-05-16 ENCOUNTER — Encounter (HOSPITAL_BASED_OUTPATIENT_CLINIC_OR_DEPARTMENT_OTHER): Payer: Self-pay

## 2016-05-16 ENCOUNTER — Ambulatory Visit (INDEPENDENT_AMBULATORY_CARE_PROVIDER_SITE_OTHER): Payer: Managed Care, Other (non HMO) | Admitting: Family Medicine

## 2016-05-16 ENCOUNTER — Ambulatory Visit (HOSPITAL_BASED_OUTPATIENT_CLINIC_OR_DEPARTMENT_OTHER)
Admission: RE | Admit: 2016-05-16 | Discharge: 2016-05-16 | Disposition: A | Discharge status: Home / Self Care | Payer: Managed Care, Other (non HMO) | Source: Ambulatory Visit | Attending: Family Medicine | Admitting: Family Medicine

## 2016-05-16 VITALS — BP 107/50 | HR 66 | Temp 98.2°F | Ht 67.0 in | Wt 259.0 lb

## 2016-05-16 DIAGNOSIS — Z9889 Other specified postprocedural states: Secondary | ICD-10-CM | POA: Insufficient documentation

## 2016-05-16 DIAGNOSIS — R1902 Left upper quadrant abdominal swelling, mass and lump: Secondary | ICD-10-CM | POA: Insufficient documentation

## 2016-05-16 DIAGNOSIS — R1032 Left lower quadrant pain: Secondary | ICD-10-CM | POA: Diagnosis not present

## 2016-05-16 DIAGNOSIS — K760 Fatty (change of) liver, not elsewhere classified: Secondary | ICD-10-CM | POA: Diagnosis not present

## 2016-05-16 LAB — CBC WITH DIFFERENTIAL/PLATELET
BASOS ABS: 66 {cells}/uL (ref 0–200)
BASOS PCT: 1 %
EOS ABS: 198 {cells}/uL (ref 15–500)
Eosinophils Relative: 3 %
HCT: 46.1 % (ref 38.5–50.0)
Hemoglobin: 14.9 g/dL (ref 13.2–17.1)
LYMPHS PCT: 47 %
Lymphs Abs: 3102 cells/uL (ref 850–3900)
MCH: 28.7 pg (ref 27.0–33.0)
MCHC: 32.3 g/dL (ref 32.0–36.0)
MCV: 88.8 fL (ref 80.0–100.0)
MONOS PCT: 9 %
MPV: 11.6 fL (ref 7.5–12.5)
Monocytes Absolute: 594 cells/uL (ref 200–950)
Neutro Abs: 2640 cells/uL (ref 1500–7800)
Neutrophils Relative %: 40 %
PLATELETS: 266 10*3/uL (ref 140–400)
RBC: 5.19 MIL/uL (ref 4.20–5.80)
RDW: 13.3 % (ref 11.0–15.0)
WBC: 6.6 10*3/uL (ref 3.8–10.8)

## 2016-05-16 LAB — BASIC METABOLIC PANEL WITH GFR
BUN: 14 mg/dL (ref 7–25)
CALCIUM: 9.7 mg/dL (ref 8.6–10.3)
CO2: 27 mmol/L (ref 20–31)
CREATININE: 0.94 mg/dL (ref 0.70–1.33)
Chloride: 104 mmol/L (ref 98–110)
GFR, Est African American: >89 mL/min (ref >=60)
GFR, Est Non African American: >89 mL/min (ref >=60)
Glucose, Bld: 89 mg/dL (ref 65–99)
Potassium: 4.9 mmol/L (ref 3.5–5.3)
SODIUM: 138 mmol/L (ref 135–146)

## 2016-05-16 MED ORDER — IOPAMIDOL (ISOVUE-300) INJECTION 61%
100.0000 mL | Freq: Once | INTRAVENOUS | Status: AC | PRN
  Administered 2016-05-16: 100 mL via INTRAVENOUS

## 2016-05-16 NOTE — Telephone Encounter (Signed)
Stat labs called with results.

## 2016-05-16 NOTE — Progress Notes (Signed)
   Subjective:    CC: LLQ abd pain.   HPI:  55 year old male with a prior history of repeat diverticulitis status post partial colectomy comes in today with left lower quadrant pain. He said he would notice some occasional discomfort in the left lower quadrant over the left groin crease particularly with exercise. A friend several months ago he started working out regularly and has been doing a great job losing weight. He said occasionally which is still almost like a pulled muscle. The last couple weeks it has become more persistent and finally last night it woke him up at 3 AM and he was unable to get comfortable. It was then starting to radiate around to the left low back very similar to his pain when he had diverticulitis previously. He has not run a fever that he can tell. No nausea. He has noticed this prominence have become more irregular over the last week. No blood in the stool. No vomiting. He says he woke up this morning just not feeling well in general. Overall just feeling tired and achy.  Past medical history, Surgical history, Family history not pertinant except as noted below, Social history, Allergies, and medications have been entered into the medical record, reviewed, and corrections made.   Review of Systems: No fevers, chills, night sweats, weight loss, chest pain, or shortness of breath.   Objective:    General: Well Developed, well nourished, and in no acute distress.  Neuro: Alert and oriented x3, extra-ocular muscles intact, sensation grossly intact.  HEENT: Normocephalic, atraumatic  Skin: Warm and dry, no rashes. Cardiac: Regular rate and rhythm, no murmurs rubs or gallops, no lower extremity edema.  Respiratory: Clear to auscultation bilaterally. Not using accessory muscles, speaking in full sentences. ABD: soft TTP in the LLQ and mildly tender in the LUQ. No rebound or guarding.   Impression and Recommendations:    Left lower quadrant abdominal pain-he is  tender on exam today. Consider recurrence of diverticulitis versus abdominal hernia. Will schedule for CT. We'll try to get order placed for stat so that he can get it done today at all possible.

## 2016-05-17 ENCOUNTER — Other Ambulatory Visit: Payer: Self-pay | Admitting: Family Medicine

## 2016-05-17 DIAGNOSIS — R935 Abnormal findings on diagnostic imaging of other abdominal regions, including retroperitoneum: Secondary | ICD-10-CM

## 2016-05-17 NOTE — Progress Notes (Signed)
All labs are normal. 

## 2016-05-26 ENCOUNTER — Telehealth: Payer: Self-pay | Admitting: *Deleted

## 2016-05-26 NOTE — Telephone Encounter (Signed)
Pt lvm stating that is is claustrophobic and has a MRI scheduled for Monday. Will need something to help him to get thru this.[name] [place]

## 2016-05-27 MED ORDER — LORAZEPAM 0.5 MG PO TABS: ORAL_TABLET | ORAL | 0 refills | Status: DC

## 2016-05-27 NOTE — Telephone Encounter (Signed)
Called pt and lvm informing him that rx has been sent.[name] [place]

## 2016-05-27 NOTE — Telephone Encounter (Signed)
Ok to send ativan 0.5mg  - 1 po about 30 min before procedure. No RF

## 2016-05-30 ENCOUNTER — Ambulatory Visit: Payer: Managed Care, Other (non HMO)

## 2016-05-30 DIAGNOSIS — R935 Abnormal findings on diagnostic imaging of other abdominal regions, including retroperitoneum: Secondary | ICD-10-CM

## 2016-05-30 DIAGNOSIS — M7541 Impingement syndrome of right shoulder: Secondary | ICD-10-CM

## 2016-05-30 MED ORDER — GADOBENATE DIMEGLUMINE 529 MG/ML IV SOLN
20.0000 mL | Freq: Once | INTRAVENOUS | Status: AC | PRN
  Administered 2016-05-30: 20 mL via INTRAVENOUS

## 2016-06-01 MED ORDER — TRAMADOL HCL 50 MG PO TABS: ORAL_TABLET | ORAL | 0 refills | Status: DC

## 2016-06-01 NOTE — Progress Notes (Unsigned)
Error

## 2016-06-22 ENCOUNTER — Encounter: Payer: Self-pay | Admitting: Osteopathic Medicine

## 2016-06-22 ENCOUNTER — Ambulatory Visit (INDEPENDENT_AMBULATORY_CARE_PROVIDER_SITE_OTHER): Payer: Managed Care, Other (non HMO) | Admitting: Osteopathic Medicine

## 2016-06-22 VITALS — BP 126/81 | HR 76 | Temp 98.4°F | Ht 67.0 in | Wt 264.0 lb

## 2016-06-22 DIAGNOSIS — J069 Acute upper respiratory infection, unspecified: Secondary | ICD-10-CM

## 2016-06-22 DIAGNOSIS — B9789 Other viral agents as the cause of diseases classified elsewhere: Secondary | ICD-10-CM | POA: Diagnosis not present

## 2016-06-22 MED ORDER — GUAIFENESIN-CODEINE 100-10 MG/5ML PO SYRP: 5.0000 mL | ORAL_SOLUTION | Freq: Three times a day (TID) | ORAL | 0 refills | Status: DC | PRN

## 2016-06-22 MED ORDER — AZITHROMYCIN 250 MG PO TABS: ORAL_TABLET | ORAL | 0 refills | Status: DC

## 2016-06-22 MED ORDER — IPRATROPIUM BROMIDE 0.03 % NA SOLN: 2.0000 | Freq: Four times a day (QID) | NASAL | 0 refills | Status: DC | PRN

## 2016-06-22 NOTE — Progress Notes (Signed)
HPI: Frederick Yang is a 55 y.o. male who presents to [facility] 06/22/16 for chief complaint of:  Chief Complaint  Patient presents with  . Cough    3-4 days    Acute Illness: . Location: "feels like into my chest" But also in sinuses . Quality: congestion, some productive cough . Assoc signs/symptoms: see ROS, sinus headache  . Duration: 5 days . Modifying factors: has tried the following OTC/Rx medications: zinc, mucinex DM    Past medical, social and family history reviewed. Current medications and allergies reviewed.     Review of Systems:  Constitutional: No  fever/chills  HEENT: Yes  headache, No  sore throat, No  swollen glands  Cardiovascular: No chest pain  Respiratory:Yes  cough, No  shortness of breath  Gastrointestinal:No nausea, No  vomiting,  No  diarrhea  Musculoskeletal:   No  myalgia/arthralgia  Skin/Integument:  No  rash   Exam:  BP 126/81   Pulse 76   Temp 98.4 F (36.9 C) (Oral)   Ht 5\' 7"  (1.702 m)   Wt 264 lb (119.7 kg)   BMI 41.35 kg/m   Constitutional: VSS, see above. General Appearance: alert, well-developed, well-nourished, NAD  Eyes: Normal lids and conjunctive, non-icteric sclera, PERRLA  Ears, Nose, Mouth, Throat: Normal external inspection ears/nares/mouth/lips/gums, normal TM, MMM; posterior pharynx with erythema, without exudate, nasal mucosa normal  Neck: No masses, trachea midline. normal lymph nodes  Respiratory: Normal respiratory effort. No  wheeze/rhonchi/rales  Cardiovascular: S1/S2 normal, no murmur/rub/gallop auscultated. RRR.    ASSESSMENT/PLAN: Printed prescription for antibiotic given to the patient, he thinks was likely viral upper respiratory infection, lungs are clear. Instructions given on appropriate filling of antibiotic prescription, patient verbalizes understanding.  Viral URI with cough - Plan: azithromycin (ZITHROMAX) 250 MG tablet, guaiFENesin-codeine  (ROBITUSSIN AC) 100-10 MG/5ML syrup, ipratropium (ATROVENT) 0.03 % nasal spray     Patient Instructions  Note: the following list assumes no pregnancy, normal liver & kidney function and no other drug interactions. Dr. Sheppard Coil has highlighted medications which are safe for you to use, but these may not be appropriate for everyone. Always ask a pharmacist or qualified medical provider if there are any questions!    Aches/Pains, Fever Acetaminophen (Tylenol) 500 mg tablets - take max 2 tablets (1000 mg) every 6 hours (4 times per day)  Ibuprofen (Motrin) 200 mg tablets - take max 4 tablets (800 mg) every 6 hours  Sinus Congestion Prescription Atrovent Cromolyn Nasal Spray (NasalCrom) 1 spray each nostril 3-4 times per day, max 6 imes per day Nasal Saline if desired Oxymetolazone (Afrin, others) sparing use due to rebound congestion Phenylephrine (Sudafed) 10 mg tablets every 4 hours (or the 12-hour formulation) Diphenhydramine (Benadryl) 25 mg tablets - take max 2 tablets every 4 hours  Cough & Sore Throat Prescription cough pills or syrups Dextromethorphan (Robitussin, others) - cough suppressant Guaifenesin (Robitussin, Mucinex, others) - expectorant (helps cough up mucus) (Dextromethorphan and Guaifenesin also come in a combination tablet) Lozenges w/ Benzocaine + Menthol (Cepacol) Honey - as much as you want! Teas which "coat the throat" - look for ingredients Elm Bark, Licorice Root, Marshmallow Root  Other Zinc Lozenges within 24 hours of symptoms onset - mixed evidence this shortens the duration of the common cold Don't waste your money on Vitamin C or Echinacea       Visit summary was printed for the patient with medications and pertinent instructions for patient to review. ER/RTC precautions reviewed. All  questions answered. Return if symptoms worsen or fail to improve.

## 2016-06-22 NOTE — Patient Instructions (Signed)

## 2016-06-29 ENCOUNTER — Telehealth: Payer: Self-pay

## 2016-06-29 DIAGNOSIS — J069 Acute upper respiratory infection, unspecified: Secondary | ICD-10-CM

## 2016-06-29 DIAGNOSIS — B9789 Other viral agents as the cause of diseases classified elsewhere: Principal | ICD-10-CM

## 2016-06-29 MED ORDER — GUAIFENESIN-CODEINE 100-10 MG/5ML PO SYRP: 5.0000 mL | ORAL_SOLUTION | Freq: Three times a day (TID) | ORAL | 0 refills | Status: DC | PRN

## 2016-06-29 NOTE — Telephone Encounter (Signed)
Patient called stated that he is not feeling any better for his cough and he started the Zpack on Monday. He is requesting a refill on the cough medication. Please advise. Rhonda Cunningham,CMA

## 2016-06-29 NOTE — Telephone Encounter (Signed)
Patient has been informed. Naylin Burkle,CMA  

## 2016-06-29 NOTE — Telephone Encounter (Signed)
Okay to refill the cough medication, printed prescription - no further refills after this. If still having problems over the next week, we'll need to come to office for further evaluation/follow-up with PCP  [place] controlled substance database reviewed, see scanned documents.

## 2016-07-11 ENCOUNTER — Ambulatory Visit (INDEPENDENT_AMBULATORY_CARE_PROVIDER_SITE_OTHER): Payer: Commercial Managed Care - PPO

## 2016-07-11 ENCOUNTER — Ambulatory Visit (INDEPENDENT_AMBULATORY_CARE_PROVIDER_SITE_OTHER): Payer: Commercial Managed Care - PPO | Admitting: Osteopathic Medicine

## 2016-07-11 ENCOUNTER — Other Ambulatory Visit: Payer: Self-pay | Admitting: *Deleted

## 2016-07-11 VITALS — BP 121/60 | HR 74 | Temp 98.8°F | Wt 261.0 lb

## 2016-07-11 DIAGNOSIS — R05 Cough: Secondary | ICD-10-CM

## 2016-07-11 DIAGNOSIS — J209 Acute bronchitis, unspecified: Secondary | ICD-10-CM

## 2016-07-11 DIAGNOSIS — R059 Cough, unspecified: Secondary | ICD-10-CM

## 2016-07-11 MED ORDER — DOXYCYCLINE HYCLATE 100 MG PO TABS: 100.0000 mg | ORAL_TABLET | Freq: Two times a day (BID) | ORAL | 0 refills | Status: DC

## 2016-07-11 MED ORDER — HYDROCODONE-HOMATROPINE 5-1.5 MG/5ML PO SYRP: 5.0000 mL | ORAL_SOLUTION | ORAL | 0 refills | Status: DC | PRN

## 2016-07-11 NOTE — Patient Instructions (Signed)

## 2016-07-11 NOTE — Progress Notes (Signed)
HPI: Frederick Yang is a 56 y.o. male who presents to [facility] 07/11/16 for chief complaint of:  Chief Complaint  Patient presents with  . URI    Acute Illness: . Context: recently seen and treated for viral upper respiratory infection/cough, see previous progress notes 06/22/2016. Patient did end up filling anitbiotics and eventually started to feel better but new illness started this time about 5 days ago with 3 days ago significant cough that has not gone away  . Location: chest/sinuses . Quality: cough is most bothersome symptom . Assoc signs/symptoms: see ROS . Duration: see above . Modifying factors: has tried the following OTC/Rx medications: none   Past medical, social and family history reviewed. Current medications and allergies reviewed.     Review of Systems:  Constitutional: no current fever/chills  HEENT: Yes  headache, No  sore throat, No  swollen glands  Cardiovascular: No chest pain  Respiratory:Yes  cough, No  shortness of breath  Gastrointestinal: No  nausea, No  vomiting,  No  diarrhea  Musculoskeletal:   No  myalgia/arthralgia  Skin/Integument:  No  rash   Exam:  BP 121/60   Pulse 74   Temp 98.8 F (37.1 C) (Oral)   Wt 261 lb (118.4 kg)   SpO2 98%   BMI 40.88 kg/m   Constitutional: VSS, see above. General Appearance: alert, well-developed, well-nourished, NAD  Eyes: Normal lids and conjunctive, non-icteric sclera, PERRLA  Ears, Nose, Mouth, Throat: Normal external inspection ears/nares/mouth/lips/gums, normal TM, MMM; posterior pharynx without erythema, without exudate, nasal mucosa normal  Neck: No masses, trachea midline. normal lymph nodes  Respiratory: Normal respiratory effort. No  wheeze/rhonchi/rales  Cardiovascular: S1/S2 normal, no murmur/rub/gallop auscultated. RRR.    ASSESSMENT/PLAN: I think new viral URI/bronchitis versus recurrence, CXR on personal review appears normal, radiology  over-read pending. Pt still has list of OTC remedies, ok to use cough syrup as well, fill abx if fever or more severe cough but I think viral issue. Call us if no significant improvement in 2-3 days and would consider steroids and inhaler at that point.   Cough - Plan: DG Chest 2 View  Acute bronchitis, unspecified organism   Pt instructions printed for bronchitis  Visit summary was printed for the patient with medications and pertinent instructions for patient to review. ER/RTC precautions reviewed. All questions answered. Return if symptoms worsen or fail to improve.

## 2016-08-02 ENCOUNTER — Ambulatory Visit (INDEPENDENT_AMBULATORY_CARE_PROVIDER_SITE_OTHER): Payer: Commercial Managed Care - PPO | Admitting: Family Medicine

## 2016-08-02 ENCOUNTER — Ambulatory Visit (INDEPENDENT_AMBULATORY_CARE_PROVIDER_SITE_OTHER): Payer: Commercial Managed Care - PPO

## 2016-08-02 ENCOUNTER — Encounter: Payer: Self-pay | Admitting: Family Medicine

## 2016-08-02 VITALS — BP 109/51 | HR 65 | Ht 67.0 in | Wt 265.0 lb

## 2016-08-02 DIAGNOSIS — R1012 Left upper quadrant pain: Secondary | ICD-10-CM | POA: Diagnosis not present

## 2016-08-02 DIAGNOSIS — R1032 Left lower quadrant pain: Secondary | ICD-10-CM | POA: Diagnosis not present

## 2016-08-02 DIAGNOSIS — K862 Cyst of pancreas: Secondary | ICD-10-CM | POA: Diagnosis not present

## 2016-08-02 NOTE — Progress Notes (Signed)
Subjective:    Patient ID: Frederick Yang, male    DOB: Jan 25, 1961, 56 y.o.   MRN: [id number]  HPI Follow-up pancreatic cyst. He had an MR of the abdomen, 2 months ago,  on 05/30/2016 which showed a complex but nonenhancing 2.2 x 1.4 similar lesion along the inferior margin of the pancreatic tell. They felt like it was consistent with possible complex pseudocyst versus locus of fat necrosis. They recommended noncontrast CT of the abdomen in 6 months time just to rule out growth of the lesion. He said he's had some abdominal discomfort since around Christmas, approximately 6-7 weeks ago. He describes his pain is mostly dull but it can vary. Sometimes it's a 3 out of 10 and sometimes it can be more sharp and be an 8 out of 10. He says often times it just lasts for a few seconds. It seems to be worse on the left side. It bothers him more if he sleeps on his stomach. He denies any major changes or problems with his diet. There if he overeats he will get some discomfort. He reports normal bowel movements. No fevers, sweats, chills. He does experience nausea occasionally.  Mostly in the left upper quadrant area just above the left groin crease in the left flank posteriorly. Early is not having any midabdominal pain which is what he experienced previously when he was rinsing diverticulitis.   Review of Systems  He denies any constipation symptoms.  BP (!) 109/51   Pulse 65   Ht 5' 7"  (1.702 m)   Wt 265 lb (120.2 kg)   SpO2 99%   BMI 41.50 kg/m     No Known Allergies  Past Medical History:  Diagnosis Date  . Diabetes mellitus without complication ([place])   . GERD (gastroesophageal reflux disease)   . Hyperlipidemia   . Prostate hypertrophy     Past Surgical History:  Procedure Laterality Date  . APPENDECTOMY    . HEMORRHOID BANDING  06/24/13   Dr. Ludwig Lean  . HEMORRHOID BANDING  07/12/2013  . HERNIA REPAIR     RT groin  . right shoulder surgery  03/06/2014   Dr. Tamera Punt   .  TREATMENT FISTULA ANAL      Social History   Social History  . Marital status: Married    Spouse name: N/A  . Number of children: 3   . Years of education: N/A   Occupational History  .  [place] History Main Topics  . Smoking status: Former Smoker    Types: Cigarettes    Quit date: 06/27/1988  . Smokeless tobacco: Never Used  . Alcohol use Yes  . Drug use: No  . Sexual activity: Not on file   Other Topics Concern  . Not on file   Social History Narrative   Started some exercise. Occ caffeine but not daily.     Family History  Problem Relation Age of Onset  . Breast cancer Mother 2  . Uterine cancer Mother   . Alcohol abuse Maternal Uncle   . Heart attack Maternal Uncle   . Stroke Maternal Uncle   . Lung cancer Maternal Grandfather   . Diabetes Maternal Aunt   . Hypertension Brother   . Hypertension Sister   . Diabetes Sister     Outpatient Encounter Prescriptions as of 08/02/2016  Medication Sig  . Blood Glucose Monitoring Suppl ([employer] BLOOD GLUCOSE METER) w/Device KIT USE AS DIRECTED ONCE A DAY  . [employer] ADVANCED  GLUCOSE TEST test strip USE AS DIRECTED ONCE A DAY  . [employer]S LANCETS ULTRA-THIN 30G MISC USE AS DIRECTED ONCE A DAY  . ipratropium (ATROVENT) 0.03 % nasal spray Place 2 sprays into both nostrils 4 (four) times daily as needed for rhinitis (nasal discharge/congestion).  . traMADol (ULTRAM) 50 MG tablet 2 tabs by mouth Q8 hours, maximum 6 tabs per day.  . [DISCONTINUED] doxycycline (VIBRA-TABS) 100 MG tablet Take 1 tablet (100 mg total) by mouth 2 (two) times daily.  . [DISCONTINUED] HYDROcodone-homatropine (HYCODAN) 5-1.5 MG/5ML syrup Take 5 mLs by mouth every 4 (four) hours as needed for cough.   No facility-administered encounter medications on file as of 08/02/2016.           Objective:   Physical Exam  Constitutional: He is oriented to person, place, and time. He appears well-developed and well-nourished.  HENT:  Head: Normocephalic and  atraumatic.  Cardiovascular: Normal rate, regular rhythm and normal heart sounds.   Pulmonary/Chest: Effort normal and breath sounds normal.  Abdominal: Soft. Bowel sounds are normal. He exhibits no distension and no mass. There is tenderness. There is no rebound and no guarding.  Mild tenderness in the LLQ.    Neurological: He is alert and oriented to person, place, and time.  Skin: Skin is warm and dry.  Psychiatric: He has a normal mood and affect. His behavior is normal.       Assessment & Plan:  Pancreatic cyst-recommend repeat CT in early June without contrast.  LUQ pain - will check CBC, CMP and aKUB . Consider diverticulitis that he's not really nauseated or running a fever and his pain is slightly different. Consider complications from surgery such as adhesions. Consider constipation, even though she feels is moving his bowels normally.

## 2016-08-03 LAB — COMPLETE METABOLIC PANEL WITH GFR
ALT: 15 U/L (ref 9–46)
AST: 17 U/L (ref 10–35)
Albumin: 4 g/dL (ref 3.6–5.1)
Alkaline Phosphatase: 58 U/L (ref 40–115)
BUN: 14 mg/dL (ref 7–25)
CHLORIDE: 98 mmol/L (ref 98–110)
CO2: 27 mmol/L (ref 20–31)
CREATININE: 1.03 mg/dL (ref 0.70–1.33)
Calcium: 9.4 mg/dL (ref 8.6–10.3)
GFR, Est African American: >89 mL/min (ref >=60)
GFR, Est Non African American: 83 mL/min (ref >=60)
Glucose, Bld: 84 mg/dL (ref 65–99)
Potassium: 4.2 mmol/L (ref 3.5–5.3)
Sodium: 135 mmol/L (ref 135–146)
Total Bilirubin: 0.5 mg/dL (ref 0.2–1.2)
Total Protein: 7.1 g/dL (ref 6.1–8.1)

## 2016-08-03 LAB — CBC WITH DIFFERENTIAL/PLATELET
BASOS PCT: 1 %
Basophils Absolute: 59 cells/uL (ref 0–200)
Eosinophils Absolute: 236 cells/uL (ref 15–500)
Eosinophils Relative: 4 %
HCT: 45.3 % (ref 38.5–50.0)
Hemoglobin: 14.9 g/dL (ref 13.2–17.1)
LYMPHS PCT: 43 %
Lymphs Abs: 2537 cells/uL (ref 850–3900)
MCH: 28.6 pg (ref 27.0–33.0)
MCHC: 32.9 g/dL (ref 32.0–36.0)
MCV: 86.9 fL (ref 80.0–100.0)
MONO ABS: 590 {cells}/uL (ref 200–950)
MONOS PCT: 10 %
MPV: 10.8 fL (ref 7.5–12.5)
Neutro Abs: 2478 cells/uL (ref 1500–7800)
Neutrophils Relative %: 42 %
PLATELETS: 242 10*3/uL (ref 140–400)
RBC: 5.21 MIL/uL (ref 4.20–5.80)
RDW: 13.9 % (ref 11.0–15.0)
WBC: 5.9 10*3/uL (ref 3.8–10.8)

## 2016-08-03 LAB — URINALYSIS, MICROSCOPIC ONLY
BACTERIA UA: NONE SEEN [HPF]
Casts: NONE SEEN [LPF]
Crystals: NONE SEEN [HPF]
RBC / HPF: NONE SEEN RBC/HPF (ref <=2)
SQUAMOUS EPITHELIAL / LPF: NONE SEEN [HPF] (ref <=5)
WBC UA: NONE SEEN WBC/HPF (ref <=5)
Yeast: NONE SEEN [HPF]

## 2016-08-03 NOTE — Progress Notes (Signed)
All labs are normal. 

## 2016-08-30 ENCOUNTER — Encounter: Payer: Self-pay | Admitting: Family Medicine

## 2016-08-31 ENCOUNTER — Encounter: Payer: Self-pay | Admitting: Family Medicine

## 2016-08-31 ENCOUNTER — Ambulatory Visit (INDEPENDENT_AMBULATORY_CARE_PROVIDER_SITE_OTHER): Payer: Commercial Managed Care - PPO | Admitting: Family Medicine

## 2016-08-31 VITALS — BP 117/61 | HR 81 | Ht 67.0 in | Wt 270.0 lb

## 2016-08-31 DIAGNOSIS — E119 Type 2 diabetes mellitus without complications: Secondary | ICD-10-CM

## 2016-08-31 DIAGNOSIS — Z0000 Encounter for general adult medical examination without abnormal findings: Secondary | ICD-10-CM | POA: Diagnosis not present

## 2016-08-31 LAB — LIPID PANEL W/REFLEX DIRECT LDL
Cholesterol: 245 mg/dL — ABNORMAL HIGH (ref <200)
HDL: 48 mg/dL (ref >40)
LDL-CHOLESTEROL: 171 mg/dL — AB
Non-HDL Cholesterol (Calc): 197 mg/dL — ABNORMAL HIGH (ref <130)
Total CHOL/HDL Ratio: 5.1 Ratio — ABNORMAL HIGH (ref <5.0)
Triglycerides: 123 mg/dL (ref <150)

## 2016-08-31 LAB — POCT GLYCOSYLATED HEMOGLOBIN (HGB A1C): HEMOGLOBIN A1C: 5.5

## 2016-08-31 LAB — COMPLETE METABOLIC PANEL WITH GFR
ALT: 16 U/L (ref 9–46)
AST: 14 U/L (ref 10–35)
Albumin: 3.9 g/dL (ref 3.6–5.1)
Alkaline Phosphatase: 69 U/L (ref 40–115)
BILIRUBIN TOTAL: 0.4 mg/dL (ref 0.2–1.2)
BUN: 10 mg/dL (ref 7–25)
CO2: 27 mmol/L (ref 20–31)
Calcium: 9.2 mg/dL (ref 8.6–10.3)
Chloride: 103 mmol/L (ref 98–110)
Creat: 0.86 mg/dL (ref 0.70–1.33)
GFR, Est Non African American: >89 mL/min (ref >=60)
Glucose, Bld: 103 mg/dL — ABNORMAL HIGH (ref 65–99)
Potassium: 4.5 mmol/L (ref 3.5–5.3)
SODIUM: 137 mmol/L (ref 135–146)
TOTAL PROTEIN: 7.5 g/dL (ref 6.1–8.1)

## 2016-08-31 LAB — PSA: PSA: 3.9 ng/mL (ref <=4.0)

## 2016-08-31 NOTE — Patient Instructions (Signed)
Keep up a regular exercise program and make sure you are eating a healthy diet Try to eat 4 servings of dairy a day, or if you are lactose intolerant take a calcium with vitamin D daily.  Your vaccines are up to date.   

## 2016-08-31 NOTE — Progress Notes (Signed)
Subjective:    CC: CPE  HPI: Here for CPE.  He has been exercising regularly and eating healthy. He is now on Dicyclomine for his bowels. He is getting some persistent left lower quadrant pain and went to [facility] Elberta [facility]is [facility]. I feel it could be scar tissue and recommended a trial of dicyclomine. He does feel like it's been helpful thus far. He reports that his eye exam is up-to-date.  BP 117/61   Pulse 81   Ht _0  (1.702 m)   Wt 270 lb (122.5 kg)   SpO2 99%   BMI 42.29 kg/m     No Known Allergies  Past Medical History:  Diagnosis Date  . Diabetes mellitus without complication ([place])   . GERD (gastroesophageal reflux disease)   . Hyperlipidemia   . Prostate hypertrophy     Past Surgical History:  Procedure Laterality Date  . APPENDECTOMY    . HEMORRHOID BANDING  06/24/13   Dr. Ludwig Lean  . HEMORRHOID BANDING  07/12/2013  . HERNIA REPAIR     RT groin  . right shoulder surgery  03/06/2014   Dr. Tamera Punt   . TREATMENT FISTULA ANAL      Social History   Social History  . Marital status: Married    Spouse name: N/A  . Number of children: 3   . Years of education: N/A   Occupational History  .  [place] History Main Topics  . Smoking status: Former Smoker    Types: Cigarettes    Quit date: 06/27/1988  . Smokeless tobacco: Never Used  . Alcohol use Yes  . Drug use: No  . Sexual activity: Not on file   Other Topics Concern  . Not on file   Social History Narrative   Started some exercise. Occ caffeine but not daily.     Family History  Problem Relation Age of Onset  . Breast cancer Mother 31  . Uterine cancer Mother   . Alcohol abuse Maternal Uncle   . Heart attack Maternal Uncle   . Stroke Maternal Uncle   . Lung cancer Maternal Grandfather   . Diabetes Maternal Aunt   . Hypertension Brother   . Hypertension Sister   . Diabetes Sister     Outpatient Encounter Prescriptions as of 08/31/2016  Medication  Sig  . Blood Glucose Monitoring Suppl (CVS BLOOD GLUCOSE METER) w/Device KIT USE AS DIRECTED ONCE A DAY  . CVS ADVANCED GLUCOSE TEST test strip USE AS DIRECTED ONCE A DAY  . CVS LANCETS ULTRA-THIN 30G MISC USE AS DIRECTED ONCE A DAY  . ipratropium (ATROVENT) 0.03 % nasal spray Place 2 sprays into both nostrils 4 (four) times daily as needed for rhinitis (nasal discharge/congestion).  . traMADol (ULTRAM) 50 MG tablet 2 tabs by mouth Q8 hours, maximum 6 tabs per day.   No facility-administered encounter medications on file as of 08/31/2016.        Review of Systems: No fevers, chills, night sweats, weight loss, chest pain, or shortness of breath.   Objective:    Physical Exam  Constitutional: He is oriented to person, place, and time. He appears well-developed and well-nourished.  HENT:  Head: Normocephalic and atraumatic.  Right Ear: External ear normal.  Left Ear: External ear normal.  Nose: Nose normal.  Mouth/Throat: Oropharynx is clear and moist.  Eyes: Conjunctivae and EOM are normal. Pupils are equal, round, and reactive to light.  Neck: Normal range of motion. Neck  supple. No thyromegaly present.  Cardiovascular: Normal rate, regular rhythm, normal heart sounds and intact distal pulses.   Pulmonary/Chest: Effort normal and breath sounds normal.  Abdominal: Soft. Bowel sounds are normal. He exhibits no distension and no mass. There is no tenderness. There is no rebound and no guarding.  Musculoskeletal: Normal range of motion.  Lymphadenopathy:    He has no cervical adenopathy.  Neurological: He is alert and oriented to person, place, and time. He has normal reflexes.  Skin: Skin is warm and dry.  Psychiatric: He has a normal mood and affect. His behavior is normal. Judgment and thought content normal.      Impression and Recommendations:    CPE Keep up a regular exercise program and make sure you are eating a healthy diet Try to eat 4 servings of dairy a day, or if you  are lactose intolerant take a calcium with vitamin D daily.  Your vaccines are up to date.   DM- A1C of 5.5 today.

## 2016-08-31 NOTE — Addendum Note (Signed)
Addended by: Teddy Spike on: 08/31/2016 03:39 PM   Modules accepted: Orders

## 2016-10-31 ENCOUNTER — Encounter: Payer: Self-pay | Admitting: Family Medicine

## 2016-10-31 ENCOUNTER — Ambulatory Visit (INDEPENDENT_AMBULATORY_CARE_PROVIDER_SITE_OTHER): Payer: Commercial Managed Care - PPO | Admitting: Family Medicine

## 2016-10-31 VITALS — BP 124/58 | HR 77 | Ht 67.0 in | Wt 285.0 lb

## 2016-10-31 DIAGNOSIS — S30861A Insect bite (nonvenomous) of abdominal wall, initial encounter: Secondary | ICD-10-CM | POA: Diagnosis not present

## 2016-10-31 DIAGNOSIS — W57XXXA Bitten or stung by nonvenomous insect and other nonvenomous arthropods, initial encounter: Secondary | ICD-10-CM

## 2016-10-31 MED ORDER — MUPIROCIN 2 % EX OINT: TOPICAL_OINTMENT | Freq: Two times a day (BID) | CUTANEOUS | 0 refills | Status: DC

## 2016-10-31 NOTE — Progress Notes (Signed)
   Subjective:    Patient ID: Frederick Yang, male    DOB: [birth year], 56 y.o.   MRN: [id number]  HPI Round a tick on his left side waistline area.  Says when he pulled it off it started to swell immediately. Says found it 2 days ago.  He is not using anything OTC on it.  Says the area is itchy and tender. He has not had any rash fevers chills sweats or joint aches. He thinks it was a deer tick because it was very small.   Review of Systems     Objective:   Physical Exam  Constitutional: He is oriented to person, place, and time. He appears well-developed and well-nourished.  HENT:  Head: Normocephalic and atraumatic.  Eyes: Conjunctivae and EOM are normal.  Cardiovascular: Normal rate.   Pulmonary/Chest: Effort normal.  Neurological: He is alert and oriented to person, place, and time.  Skin: Skin is dry. No pallor.  Along left waistline he has an approximately 2 cm oval-shaped area of erythema and in the center approximately half a centimeter elevated papule. No open wound or drainage.   Psychiatric: He has a normal mood and affect. His behavior is normal.  Vitals reviewed.         Assessment & Plan:  Tick bite-do not see any signs of systemic infections we'll just treat locally with mupirocin ointment to avoid any secondary infection. If he notices it's getting larger or more red or if he notices this is a more diffuse rash them please call immediately and will treat with doxycycline. Dissected this point he is just having a localized reaction.

## 2017-01-31 ENCOUNTER — Ambulatory Visit (INDEPENDENT_AMBULATORY_CARE_PROVIDER_SITE_OTHER): Payer: Commercial Managed Care - PPO | Admitting: Osteopathic Medicine

## 2017-01-31 ENCOUNTER — Encounter: Payer: Self-pay | Admitting: Osteopathic Medicine

## 2017-01-31 VITALS — BP 137/73 | HR 78 | Temp 98.2°F | Resp 18 | Wt 300.0 lb

## 2017-01-31 DIAGNOSIS — R059 Cough, unspecified: Secondary | ICD-10-CM

## 2017-01-31 DIAGNOSIS — T7589XA Other specified effects of external causes, initial encounter: Secondary | ICD-10-CM | POA: Diagnosis not present

## 2017-01-31 DIAGNOSIS — J069 Acute upper respiratory infection, unspecified: Secondary | ICD-10-CM | POA: Diagnosis not present

## 2017-01-31 DIAGNOSIS — R05 Cough: Secondary | ICD-10-CM | POA: Diagnosis not present

## 2017-01-31 DIAGNOSIS — IMO0001 Reserved for inherently not codable concepts without codable children: Secondary | ICD-10-CM

## 2017-01-31 MED ORDER — METHYLPREDNISOLONE 4 MG PO TBPK: ORAL_TABLET | ORAL | 0 refills | Status: DC

## 2017-01-31 MED ORDER — BENZONATATE 200 MG PO CAPS: 200.0000 mg | ORAL_CAPSULE | Freq: Three times a day (TID) | ORAL | 1 refills | Status: DC | PRN

## 2017-01-31 MED ORDER — IPRATROPIUM BROMIDE 0.06 % NA SOLN: 2.0000 | Freq: Four times a day (QID) | NASAL | 1 refills | Status: DC

## 2017-01-31 NOTE — Progress Notes (Signed)
HPI: Frederick Yang is a 56 y.o. male who presents to [facility] 01/31/17 for chief complaint of:  Chief Complaint  Patient presents with  . Cough    Acute Illness: . 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 He was also working in dusty environment prior to all this starting.  . Location/Quality: Dry cough, sinus congestion yesterday is better today . Duration: 3-4 days    Past medical, social and family history reviewed.  Immune compromising conditions or other risk factors: DM2 (controlled, low risk)   Current medications and allergies reviewed.     Review of Systems:  Constitutional: No  fever/chills  HEENT: sinus headache, mild sore throat, No  swollen glands  Cardiovascular: No chest pain  Respiratory:Yes  cough, No  shortness of breath    Detailed Exam:  BP 137/73   Pulse 78   Temp 98.2 F (36.8 C)   Resp 18   Wt 300 lb (136.1 kg)   BMI 46.99 kg/m   Constitutional:   VSS, see above.   General Appearance: alert, well-developed, well-nourished, NAD  Eyes:   Normal lids and conjunctive, non-icteric sclera  Ears, Nose, Mouth, Throat:   Normal external inspection ears/nares  Normal mouth/lips/gums, MMM  normal TM  posterior pharynx without erythema, without exudate  nasal mucosa normal  Skin:  Normal inspection, no rash or concerning lesions noted on limited exam  Neck:   No masses, trachea midline. normal lymph nodes  Respiratory:   Normal respiratory effort.   No  wheeze/rhonchi/rales  Cardiovascular:   S1/S2 normal, no murmur/rub/gallop auscultated. RRR.     ASSESSMENT/PLAN: I think viral URI versus exposure to respiratory irritants. No signs or symptoms of bacterial infection such as sinusitis or pneumonia/bronchitis at this point, would consider Augmentin or other antibiotic treatment if symptoms persist  Cough - Plan: benzonatate  (TESSALON) 200 MG capsule  Exposure to respiratory irritant, initial encounter - Plan: methylPREDNISolone (MEDROL DOSEPAK) 4 MG TBPK tablet  Viral URI - Plan: ipratropium (ATROVENT) 0.06 % nasal spray    Visit summary was printed for the patient with medications and pertinent instructions for patient to review. ER/RTC precautions reviewed. All questions answered. Return if symptoms worsen or fail to improve.

## 2017-02-07 ENCOUNTER — Encounter: Payer: Self-pay | Admitting: Family Medicine

## 2017-02-07 ENCOUNTER — Ambulatory Visit (INDEPENDENT_AMBULATORY_CARE_PROVIDER_SITE_OTHER): Payer: Commercial Managed Care - PPO

## 2017-02-07 ENCOUNTER — Ambulatory Visit (INDEPENDENT_AMBULATORY_CARE_PROVIDER_SITE_OTHER): Payer: Commercial Managed Care - PPO | Admitting: Family Medicine

## 2017-02-07 VITALS — BP 130/76 | HR 73 | Temp 98.6°F | Wt 293.0 lb

## 2017-02-07 DIAGNOSIS — K760 Fatty (change of) liver, not elsewhere classified: Secondary | ICD-10-CM

## 2017-02-07 DIAGNOSIS — R110 Nausea: Secondary | ICD-10-CM | POA: Diagnosis not present

## 2017-02-07 DIAGNOSIS — R1011 Right upper quadrant pain: Secondary | ICD-10-CM

## 2017-02-07 LAB — CBC
HEMATOCRIT: 46.3 % (ref 38.5–50.0)
HEMOGLOBIN: 15.5 g/dL (ref 13.2–17.1)
MCH: 29.8 pg (ref 27.0–33.0)
MCHC: 33.5 g/dL (ref 32.0–36.0)
MCV: 88.9 fL (ref 80.0–100.0)
MPV: 10.7 fL (ref 7.5–12.5)
Platelets: 264 10*3/uL (ref 140–400)
RBC: 5.21 MIL/uL (ref 4.20–5.80)
RDW: 13.7 % (ref 11.0–15.0)
WBC: 9.5 10*3/uL (ref 3.8–10.8)

## 2017-02-07 NOTE — Progress Notes (Signed)
Subjective:    Patient ID: Frederick Yang, male    DOB: 1960-11-14, 56 y.o.   MRN: [id number]  HPI 56 year old male comes in today complaining of intermittent right upper quad pain that started about 3 days ago. On Sunday night he started tosome discomfort at that time it was  In the right upper quadrant upper quadrant area. He dies any feverday he said after lunch she had some cheese . He never actually vomited. He said the pain eased off until about 4:30 in the afternoonafter he ate a cinnamon bun and says it came in waves until about 8:00 that night. He still describes the pain is sor at times. He said it was bad enough that he almost went to the emergency department but tried to cut a weighted out. It finally eased off. Then again after breakfast this morning he had some eggs and bacon it started again but not as intensely. He spoke with his nurse at work and they recommended that he be seen today. No recent bowel changes. He does have a history of recurrent diverticulitis.yesterday he did feel some pain in his right facial cheek   Review of Systems   BP 130/76   Pulse 73   Temp 98.6 F (37 C) (Oral)   Wt 293 lb (132.9 kg)   BMI 45.89 kg/m     No Known Allergies  Past Medical History:  Diagnosis Date  . Diabetes mellitus without complication ([place])   . GERD (gastroesophageal reflux disease)   . Hyperlipidemia   . Prostate hypertrophy     Past Surgical History:  Procedure Laterality Date  . APPENDECTOMY    . HEMORRHOID BANDING  06/24/13   Dr. Ludwig Lean  . HEMORRHOID BANDING  07/12/2013  . HERNIA REPAIR     RT groin  . right shoulder surgery  03/06/2014   Dr. Tamera Punt   . TREATMENT FISTULA ANAL      Social History   Social History  . Marital status: Married    Spouse name: N/A  . Number of children: 3   . Years of education: N/A   Occupational History  .  [employer] History Main Topics  . Smoking status: Former Smoker    Types: Cigarettes   Quit date: 06/27/1988  . Smokeless tobacco: Never Used  . Alcohol use Yes  . Drug use: No  . Sexual activity: Not on file   Other Topics Concern  . Not on file   Social History Narrative   Started some exercise. Occ caffeine but not daily.     Family History  Problem Relation Age of Onset  . Breast cancer Mother 46  . Uterine cancer Mother   . Alcohol abuse Maternal Uncle   . Heart attack Maternal Uncle   . Stroke Maternal Uncle   . Lung cancer Maternal Grandfather   . Diabetes Maternal Aunt   . Hypertension Brother   . Hypertension Sister   . Diabetes Sister     Outpatient Encounter Prescriptions as of 02/07/2017  Medication Sig  . calcium-vitamin D (OSCAL WITH D) 500-200 MG-UNIT TABS tablet Take 1 tablet by mouth every evening.  . Cinnamon 500 MG TABS Take 1,000 mg by mouth every evening.  . Flaxseed, Linseed, (FLAXSEED OIL) 1000 MG CAPS Take 1 capsule by mouth every evening.  . Garlic 7371 MG CAPS Take 1 capsule by mouth every evening.  Marland Kitchen MAGNESIUM PO Take 1 tablet by mouth daily.  . Misc Natural  Products (GLUCOSAMINE CHONDROITIN TRIPLE PO) Take 2 tablets by mouth daily.  . Multiple Vitamin (MULTIVITAMIN) capsule Take 1 capsule by mouth every evening.  . niacin 500 MG tablet Take 1 tablet by mouth daily.  . Omega-3 Fatty Acids (FISH OIL) 1000 MG CAPS Take 1 capsule by mouth every evening.  . [DISCONTINUED] benzonatate (TESSALON) 200 MG capsule Take 1 capsule (200 mg total) by mouth 3 (three) times daily as needed for cough.  . [DISCONTINUED] ipratropium (ATROVENT) 0.06 % nasal spray Place 2 sprays into both nostrils 4 (four) times daily.  . [DISCONTINUED] methylPREDNISolone (MEDROL DOSEPAK) 4 MG TBPK tablet 6-day pack as directed   No facility-administered encounter medications on file as of 02/07/2017.           Objective:   Physical Exam  Constitutional: He is oriented to person, place, and time. He appears well-developed and well-nourished.  HENT:  Head:  Normocephalic and atraumatic.  Cardiovascular: Normal rate, regular rhythm and normal heart sounds.   Pulmonary/Chest: Effort normal and breath sounds normal.  Abdominal: Soft. Bowel sounds are normal. He exhibits no distension and no mass. There is tenderness. There is no rebound and no guarding.  Very mild tenderness in the RUQ  Neurological: He is alert and oriented to person, place, and time.  Skin: Skin is warm and dry.  Psychiatric: He has a normal mood and affect. His behavior is normal.          Assessment & Plan:  Right upper quadrant pain - evaluate for possible cholecystitis. I think it's less likely be cardiac but still consider ultrasound is normal. Recommend bland diet. Avoid anything with higher fat content such as cheese etc. Consider gastritis as well.  Stay hydrated.

## 2017-02-08 LAB — COMPLETE METABOLIC PANEL WITH GFR
ALBUMIN: 3.9 g/dL (ref 3.6–5.1)
ALK PHOS: 66 U/L (ref 40–115)
ALT: 23 U/L (ref 9–46)
AST: 15 U/L (ref 10–35)
BUN: 16 mg/dL (ref 7–25)
CHLORIDE: 99 mmol/L (ref 98–110)
CO2: 24 mmol/L (ref 20–32)
CREATININE: 0.78 mg/dL (ref 0.70–1.33)
Calcium: 9.4 mg/dL (ref 8.6–10.3)
GFR, Est African American: >89 mL/min (ref >=60)
GFR, Est Non African American: >89 mL/min (ref >=60)
GLUCOSE: 113 mg/dL — AB (ref 65–99)
POTASSIUM: 4.6 mmol/L (ref 3.5–5.3)
SODIUM: 136 mmol/L (ref 135–146)
Total Bilirubin: 0.5 mg/dL (ref 0.2–1.2)
Total Protein: 7.2 g/dL (ref 6.1–8.1)

## 2017-08-16 ENCOUNTER — Ambulatory Visit (INDEPENDENT_AMBULATORY_CARE_PROVIDER_SITE_OTHER): Payer: Commercial Managed Care - PPO

## 2017-08-16 ENCOUNTER — Encounter: Payer: Self-pay | Admitting: Sports Medicine

## 2017-08-16 ENCOUNTER — Ambulatory Visit: Payer: Commercial Managed Care - PPO | Admitting: Sports Medicine

## 2017-08-16 DIAGNOSIS — M19072 Primary osteoarthritis, left ankle and foot: Secondary | ICD-10-CM | POA: Diagnosis not present

## 2017-08-16 DIAGNOSIS — M25572 Pain in left ankle and joints of left foot: Secondary | ICD-10-CM

## 2017-08-16 DIAGNOSIS — H538 Other visual disturbances: Secondary | ICD-10-CM | POA: Diagnosis not present

## 2017-08-16 MED ORDER — PREDNISONE 50 MG PO TABS: ORAL_TABLET | ORAL | 0 refills | Status: DC

## 2017-08-16 MED ORDER — MECLIZINE HCL 25 MG PO TABS: 25.0000 mg | ORAL_TABLET | Freq: Three times a day (TID) | ORAL | 3 refills | Status: DC | PRN

## 2017-08-16 NOTE — Progress Notes (Signed)
Subjective:    CC: Blurred vision  HPI: This is a pleasant 57 year old male with a history of diabetes, no history of vertigo or vestibular migraines, at work today he felt an odd sensation where his vision blurred and doubled, felt maybe a little bit off balance, this lasted for 30 seconds to a minute, he sat down and started to feel somewhat nauseated.  The symptoms resolved in a few minutes and never came back.  Is back to baseline.  No dysarthria, no other focal neurologic symptoms.  No constitutional symptoms, no upper respiratory symptoms.  In addition he is developed some pain over the dorsal tibiotalar joint.  We have treated him for midfoot arthritis in the past with good results.  I reviewed the past medical history, family history, social history, surgical history, and allergies today and no changes were needed.  Please see the problem list section below in epic for further details.  Past Medical History: Past Medical History:  Diagnosis Date  . Diabetes mellitus without complication ([place])   . GERD (gastroesophageal reflux disease)   . Hyperlipidemia   . Prostate hypertrophy    Past Surgical History: Past Surgical History:  Procedure Laterality Date  . APPENDECTOMY    . HEMORRHOID BANDING  06/24/13   Dr. Ludwig Lean  . HEMORRHOID BANDING  07/12/2013  . HERNIA REPAIR     RT groin  . right shoulder surgery  03/06/2014   Dr. Tamera Punt   . TREATMENT FISTULA ANAL     Social History: Social History   Socioeconomic History  . Marital status: Married    Spouse name: None  . Number of children: 3   . Years of education: None  . Highest education level: None  Social Needs  . Financial resource strain: None  . Food insecurity - worry: None  . Food insecurity - inability: None  . Transportation needs - medical: None  . Transportation needs - non-medical: None  Occupational History    Employer: [employer]  Tobacco Use  . Smoking status: Former Smoker    Types:  Cigarettes    Last attempt to quit: 06/27/1988    Years since quitting: 29.1  . Smokeless tobacco: Never Used  Substance and Sexual Activity  . Alcohol use: Yes  . Drug use: No  . Sexual activity: None  Other Topics Concern  . None  Social History Narrative   Started some exercise. Occ caffeine but not daily.    Family History: Family History  Problem Relation Age of Onset  . Breast cancer Mother 33  . Uterine cancer Mother   . Alcohol abuse Maternal Uncle   . Heart attack Maternal Uncle   . Stroke Maternal Uncle   . Lung cancer Maternal Grandfather   . Diabetes Maternal Aunt   . Hypertension Brother   . Hypertension Sister   . Diabetes Sister    Allergies: No Known Allergies Medications: See med rec.  Review of Systems: No fevers, chills, night sweats, weight loss, chest pain, or shortness of breath.   Objective:    General: Well Developed, well nourished, and in no acute distress.  Neuro: Alert and oriented x3, extra-ocular muscles intact, sensation grossly intact.  HEENT: Normocephalic, atraumatic, pupils equal round reactive to light, neck supple, no masses, no lymphadenopathy, thyroid nonpalpable.  Oropharynx, nasopharynx, ear canals unremarkable.  Positive Dix-Hallpike sign to the left with reproduction of vertigo but not nystagmus. Skin: Warm and dry, no rashes. Cardiac: Regular rate and rhythm, no murmurs rubs or  gallops, no lower extremity edema.  Respiratory: Clear to auscultation bilaterally. Not using accessory muscles, speaking in full sentences. Left foot and ankle: No visible erythema or swelling. Range of motion is full in all directions. Strength is 5/5 in all directions. No hallux valgus. No pes cavus or pes planus. No abnormal callus noted. No pain over the navicular prominence, or base of fifth metatarsal. No tenderness to palpation of the calcaneal insertion of plantar fascia. No pain at the Achilles insertion. No pain over the calcaneal  bursa. No pain of the retrocalcaneal bursa. No tenderness to palpation over the tarsals, metatarsals, or phalanges. No hallux rigidus or limitus. No tenderness palpation over interphalangeal joints. No pain with compression of the metatarsal heads. Neurovascularly intact distally.  Minimal tenderness over the tibiotalar joint.  X-rays personally reviewed and show osteoarthritic changes of the left ankle.  Impression and Recommendations:    Blurry vision Single episode of vertiginous symptoms and blurry vision today. He did have a positive Dix-Hallpike sign on the left. Adding meclizine, vestibular rehab, we did discuss the pathophysiology of benign paroxysmal positional vertigo. Return to see me in a month.  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 Burst of prednisone, x-rays of the ankle. Return in 1 month, injection if no better.  ___________________________________________ Gwen Her. Dianah Field, M.D., ABFM., CAQSM. Primary Care and [place] Instructor of [place] of [facility] of Medicine

## 2017-08-16 NOTE — Assessment & Plan Note (Signed)
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 Burst of prednisone, x-rays of the ankle. Return in 1 month, injection if no better.

## 2017-08-16 NOTE — Assessment & Plan Note (Signed)
Single episode of vertiginous symptoms and blurry vision today. He did have a positive Dix-Hallpike sign on the left. Adding meclizine, vestibular rehab, we did discuss the pathophysiology of benign paroxysmal positional vertigo. Return to see me in a month.

## 2017-08-23 ENCOUNTER — Ambulatory Visit (INDEPENDENT_AMBULATORY_CARE_PROVIDER_SITE_OTHER): Payer: Commercial Managed Care - PPO | Admitting: Family Medicine

## 2017-08-23 ENCOUNTER — Encounter: Payer: Self-pay | Admitting: Family Medicine

## 2017-08-23 VITALS — BP 121/61 | HR 88 | Temp 97.6°F | Ht 67.0 in | Wt 294.0 lb

## 2017-08-23 DIAGNOSIS — R5383 Other fatigue: Secondary | ICD-10-CM | POA: Diagnosis not present

## 2017-08-23 DIAGNOSIS — R110 Nausea: Secondary | ICD-10-CM

## 2017-08-23 DIAGNOSIS — H538 Other visual disturbances: Secondary | ICD-10-CM

## 2017-08-23 DIAGNOSIS — H9203 Otalgia, bilateral: Secondary | ICD-10-CM | POA: Diagnosis not present

## 2017-08-23 LAB — CBC WITH DIFFERENTIAL/PLATELET
BASOS ABS: 51 {cells}/uL (ref 0–200)
Basophils Relative: 0.6 %
EOS PCT: 2.5 %
Eosinophils Absolute: 213 cells/uL (ref 15–500)
HEMATOCRIT: 44.1 % (ref 38.5–50.0)
Hemoglobin: 15.1 g/dL (ref 13.2–17.1)
LYMPHS ABS: 3001 {cells}/uL (ref 850–3900)
MCH: 28.7 pg (ref 27.0–33.0)
MCHC: 34.2 g/dL (ref 32.0–36.0)
MCV: 83.8 fL (ref 80.0–100.0)
MPV: 11.7 fL (ref 7.5–12.5)
Monocytes Relative: 8.3 %
NEUTROS PCT: 53.3 %
Neutro Abs: 4531 cells/uL (ref 1500–7800)
Platelets: 230 10*3/uL (ref 140–400)
RBC: 5.26 10*6/uL (ref 4.20–5.80)
RDW: 12.9 % (ref 11.0–15.0)
Total Lymphocyte: 35.3 %
WBC: 8.5 10*3/uL (ref 3.8–10.8)
WBCMIX: 706 {cells}/uL (ref 200–950)

## 2017-08-23 MED ORDER — FLUTICASONE PROPIONATE 50 MCG/ACT NA SUSP: 2.0000 | Freq: Every day | NASAL | 0 refills | Status: DC

## 2017-08-23 MED ORDER — PROMETHAZINE HCL 25 MG PO TABS: 25.0000 mg | ORAL_TABLET | Freq: Three times a day (TID) | ORAL | 0 refills | Status: DC | PRN

## 2017-08-23 NOTE — Progress Notes (Signed)
Subjective:    Patient ID: Frederick Yang, male    DOB: May 10, 1961, 57 y.o.   MRN: [id number]  HPI 57 yo male comes in with c/o of blurry vision. He had had one episode and came in and saw Dr. Darene Lamer.  He had a positive Dik-Hallpike sign on the left.  He was given meclizine which he says he did not tolerate.  It was too sedating.  He has not had any more episodes of the blurry vision.  He did go to one treatment for vestibular rehab.  This is just not convinced that this was really vertigo.  He never really had any significant room spinning.  Just the one episode of blurry vision that lasted maybe a minute or 2.  No trauma or injury.  He does wear lenses and goes for regular eye exams.  Since then he just has not felt well.  He is felt intermittently nauseated and very fatigued.  He says he could just lay down and take a nap with his which is not like him.  He is not had a fever but since yesterday he has had a bilateral frontal headache that has been persistent.  It still there today.  While he was here he says Dr. Dianah Field noted that his right ear looked a little red.  He was also evaluated by the nurse at work who yesterday told him that both of his ears looked red.  He has not had any significant pain or discharge.  No sore throat.  No runny nose or drainage.  No cough.  reports that he is hydrating well and eating regularly.  He actually took a course of prednisone initially for his ankle when he saw Dr. Darene Lamer and it did not improve or alleviate his symptoms.    Review of Systems    BP 121/61   Pulse 88   Temp 97.6 F (36.4 C)   Ht 5\' 7"  (1.702 m)   Wt 294 lb (133.4 kg)   SpO2 98%   BMI 46.05 kg/m     No Known Allergies  Past Medical History:  Diagnosis Date  . Diabetes mellitus without complication ([place])   . GERD (gastroesophageal reflux disease)   . Hyperlipidemia   . Prostate hypertrophy     Past Surgical History:  Procedure Laterality Date  . APPENDECTOMY    . HEMORRHOID  BANDING  06/24/13   Dr. Ludwig Lean  . HEMORRHOID BANDING  07/12/2013  . HERNIA REPAIR     RT groin  . right shoulder surgery  03/06/2014   Dr. Tamera Punt   . TREATMENT FISTULA ANAL      Social History   Socioeconomic History  . Marital status: Married    Spouse name: Not on file  . Number of children: 3   . Years of education: Not on file  . Highest education level: Not on file  Social Needs  . Financial resource strain: Not on file  . Food insecurity - worry: Not on file  . Food insecurity - inability: Not on file  . Transportation needs - medical: Not on file  . Transportation needs - non-medical: Not on file  Occupational History    Employer: [employer]  Tobacco Use  . Smoking status: Former Smoker    Types: Cigarettes    Last attempt to quit: 06/27/1988    Years since quitting: 29.1  . Smokeless tobacco: Never Used  Substance and Sexual Activity  . Alcohol use: Yes  .  Drug use: No  . Sexual activity: Not on file  Other Topics Concern  . Not on file  Social History Narrative   Started some exercise. Occ caffeine but not daily.     Family History  Problem Relation Age of Onset  . Breast cancer Mother 45  . Uterine cancer Mother   . Alcohol abuse Maternal Uncle   . Heart attack Maternal Uncle   . Stroke Maternal Uncle   . Lung cancer Maternal Grandfather   . Diabetes Maternal Aunt   . Hypertension Brother   . Hypertension Sister   . Diabetes Sister     Outpatient Encounter Medications as of 08/23/2017  Medication Sig  . calcium-vitamin D (OSCAL WITH D) 500-200 MG-UNIT TABS tablet Take 1 tablet by mouth every evening.  . Cinnamon 500 MG TABS Take 1,000 mg by mouth every evening.  . Flaxseed, Linseed, (FLAXSEED OIL) 1000 MG CAPS Take 1 capsule by mouth every evening.  . Garlic 0160 MG CAPS Take 1 capsule by mouth every evening.  Marland Kitchen MAGNESIUM PO Take 1 tablet by mouth daily.  . Misc Natural Products (GLUCOSAMINE CHONDROITIN TRIPLE PO) Take 2 tablets by mouth  daily.  . Multiple Vitamin (MULTIVITAMIN) capsule Take 1 capsule by mouth every evening.  . niacin 500 MG tablet Take 1 tablet by mouth daily.  . Omega-3 Fatty Acids (FISH OIL) 1000 MG CAPS Take 1 capsule by mouth every evening.  . fluticasone (FLONASE) 50 MCG/ACT nasal spray Place 2 sprays into both nostrils daily.  . [DISCONTINUED] meclizine (ANTIVERT) 25 MG tablet Take 1 tablet (25 mg total) by mouth 3 (three) times daily as needed for dizziness or nausea.  . [DISCONTINUED] predniSONE (DELTASONE) 50 MG tablet One tab PO daily for 5 days.   No facility-administered encounter medications on file as of 08/23/2017.          Objective:   Physical Exam  Constitutional: He is oriented to person, place, and time. He appears well-developed and well-nourished.  HENT:  Head: Normocephalic and atraumatic.  Right Ear: External ear normal.  Left Ear: External ear normal.  Nose: Nose normal.  Mouth/Throat: Oropharynx is clear and moist.  TMs and canals are clear.   Eyes: Conjunctivae and EOM are normal. Pupils are equal, round, and reactive to light.  Neck: Neck supple. No thyromegaly present.  Cardiovascular: Normal rate and normal heart sounds.  Pulmonary/Chest: Effort normal and breath sounds normal.  Lymphadenopathy:    He has no cervical adenopathy.  Neurological: He is alert and oriented to person, place, and time.  Skin: Skin is warm and dry.  Psychiatric: He has a normal mood and affect.        Assessment & Plan:  Blurry vision -unclear etiology.  He really does have the one episode.  It still could be underlying BPPV.  Nausea -unclear etiology.  Could be related to pressure in the ears or even possible vertigo that he is not experiencing any dizziness or blurry vision at this time.  Will send in a prescription for Phenergan to take as needed.  Fatigue -check a CBC today to see if there could be some type of underlying infection.  positive pressure in the left ear based on  tympanogram today.  Recommend a trial of Flonase and a decongestant for a couple of days and through the weekend.  If he is not improving then please let us know.

## 2017-08-23 NOTE — Patient Instructions (Signed)
Also recommend a sudafed in the morning for a few days.

## 2017-09-14 ENCOUNTER — Other Ambulatory Visit: Payer: Self-pay | Admitting: Family Medicine

## 2017-09-14 ENCOUNTER — Encounter: Payer: Self-pay | Admitting: Sports Medicine

## 2017-09-14 ENCOUNTER — Ambulatory Visit: Payer: Commercial Managed Care - PPO | Admitting: Sports Medicine

## 2017-09-14 DIAGNOSIS — M19072 Primary osteoarthritis, left ankle and foot: Secondary | ICD-10-CM | POA: Diagnosis not present

## 2017-09-14 NOTE — Progress Notes (Signed)
Subjective:    CC: Ankle pain  HPI: Early returns, we treated conservatively with regards to his ankle, he did well after a right ankle joint injection years ago.  Pain was recurrent, we tried NSAIDs, rehab exercises, he has continued pain at the right tibiotalar joint, desires interventional treatment today.  Of note his vertiginous type symptom has resolved.  I reviewed the past medical history, family history, social history, surgical history, and allergies today and no changes were needed.  Please see the problem list section below in epic for further details.  Past Medical History: Past Medical History:  Diagnosis Date  . Diabetes mellitus without complication ([place])   . GERD (gastroesophageal reflux disease)   . Hyperlipidemia   . Prostate hypertrophy    Past Surgical History: Past Surgical History:  Procedure Laterality Date  . APPENDECTOMY    . HEMORRHOID BANDING  06/24/13   Dr. Ludwig Lean  . HEMORRHOID BANDING  07/12/2013  . HERNIA REPAIR     RT groin  . right shoulder surgery  03/06/2014   Dr. Tamera Punt   . TREATMENT FISTULA ANAL     Social History: Social History   Socioeconomic History  . Marital status: Married    Spouse name: Not on file  . Number of children: 3   . Years of education: Not on file  . Highest education level: Not on file  Occupational History    Employer: [employer]  Social Needs  . Financial resource strain: Not on file  . Food insecurity:    Worry: Not on file    Inability: Not on file  . Transportation needs:    Medical: Not on file    Non-medical: Not on file  Tobacco Use  . Smoking status: Former Smoker    Types: Cigarettes    Last attempt to quit: 06/27/1988    Years since quitting: 29.2  . Smokeless tobacco: Never Used  Substance and Sexual Activity  . Alcohol use: Yes  . Drug use: No  . Sexual activity: Not on file  Lifestyle  . Physical activity:    Days per week: Not on file    Minutes per session: Not on file    . Stress: Not on file  Relationships  . Social connections:    Talks on phone: Not on file    Gets together: Not on file    Attends religious service: Not on file    Active member of club or organization: Not on file    Attends meetings of clubs or organizations: Not on file    Relationship status: Not on file  Other Topics Concern  . Not on file  Social History Narrative   Started some exercise. Occ caffeine but not daily.    Family History: Family History  Problem Relation Age of Onset  . Breast cancer Mother 77  . Uterine cancer Mother   . Alcohol abuse Maternal Uncle   . Heart attack Maternal Uncle   . Stroke Maternal Uncle   . Lung cancer Maternal Grandfather   . Diabetes Maternal Aunt   . Hypertension Brother   . Hypertension Sister   . Diabetes Sister    Allergies: No Known Allergies Medications: See med rec.  Review of Systems: No fevers, chills, night sweats, weight loss, chest pain, or shortness of breath.   Objective:    General: Well Developed, well nourished, and in no acute distress.  Neuro: Alert and oriented x3, extra-ocular muscles intact, sensation grossly intact.  HEENT:  Normocephalic, atraumatic, pupils equal round reactive to light, neck supple, no masses, no lymphadenopathy, thyroid nonpalpable.  Skin: Warm and dry, no rashes. Cardiac: Regular rate and rhythm, no murmurs rubs or gallops, no lower extremity edema.  Respiratory: Clear to auscultation bilaterally. Not using accessory muscles, speaking in full sentences.  Procedure: Real-time Ultrasound Guided Injection of right ankle joint Device: GE Logiq E  Verbal informed consent obtained.  Time-out conducted.  Noted no overlying erythema, induration, or other signs of local infection.  Skin prepped in a sterile fashion.  Local anesthesia: Topical Ethyl chloride.  With sterile technique and under real time ultrasound guidance: Using a 25-gauge needle advanced into the tibiotalar joint and  injected 1 cc kenalog 40, 1 cc lidocaine, 1 cc bupivacaine, there was a mild effusion. Completed without difficulty  Pain immediately resolved suggesting accurate placement of the medication.  Advised to call if fevers/chills, erythema, induration, drainage, or persistent bleeding.  Images permanently stored and available for review in the ultrasound unit.  Impression: Technically successful ultrasound guided injection.  Impression and Recommendations:    Osteoarthritis of left midfoot Ankle injection as above, return in a month. ___________________________________________ Gwen Her. Dianah Field, M.D., ABFM., CAQSM. Primary Care and [place] Instructor of [place] of [facility] of Medicine

## 2017-09-14 NOTE — Assessment & Plan Note (Signed)
Ankle injection as above, return in a month.

## 2017-10-16 ENCOUNTER — Ambulatory Visit: Payer: Commercial Managed Care - PPO | Admitting: Sports Medicine

## 2017-10-16 ENCOUNTER — Encounter: Payer: Self-pay | Admitting: Sports Medicine

## 2017-10-16 DIAGNOSIS — M19072 Primary osteoarthritis, left ankle and foot: Secondary | ICD-10-CM

## 2017-10-16 MED ORDER — CELECOXIB 200 MG PO CAPS
ORAL_CAPSULE | ORAL | 2 refills | Status: DC
Start: 2017-10-16 — End: 2018-01-07

## 2017-10-16 NOTE — Assessment & Plan Note (Signed)
Doing well after ankle joint injection, currently pain-free. Adding Celebrex to use for breakthrough pain. Return as needed, he does need another set of orthotics.

## 2017-10-16 NOTE — Progress Notes (Signed)
Subjective:    CC: Follow-up  HPI: This is a pleasant 57 year old male, he returns for follow-up of his left ankle pain and swelling, mild arthritis on x-rays, we injected the ankle at the last visit, he returns today doing well.  I reviewed the past medical history, family history, social history, surgical history, and allergies today and no changes were needed.  Please see the problem list section below in epic for further details.  Past Medical History: Past Medical History:  Diagnosis Date  . Diabetes mellitus without complication ([place])   . GERD (gastroesophageal reflux disease)   . Hyperlipidemia   . Prostate hypertrophy    Past Surgical History: Past Surgical History:  Procedure Laterality Date  . APPENDECTOMY    . HEMORRHOID BANDING  06/24/13   Dr. Ludwig Lean  . HEMORRHOID BANDING  07/12/2013  . HERNIA REPAIR     RT groin  . right shoulder surgery  03/06/2014   Dr. Tamera Punt   . TREATMENT FISTULA ANAL     Social History: Social History   Socioeconomic History  . Marital status: Married    Spouse name: Not on file  . Number of children: 3   . Years of education: Not on file  . Highest education level: Not on file  Occupational History    Employer: [employer]  Social Needs  . Financial resource strain: Not on file  . Food insecurity:    Worry: Not on file    Inability: Not on file  . Transportation needs:    Medical: Not on file    Non-medical: Not on file  Tobacco Use  . Smoking status: Former Smoker    Types: Cigarettes    Last attempt to quit: 06/27/1988    Years since quitting: 29.3  . Smokeless tobacco: Never Used  Substance and Sexual Activity  . Alcohol use: Yes  . Drug use: No  . Sexual activity: Not on file  Lifestyle  . Physical activity:    Days per week: Not on file    Minutes per session: Not on file  . Stress: Not on file  Relationships  . Social connections:    Talks on phone: Not on file    Gets together: Not on file    Attends  religious service: Not on file    Active member of club or organization: Not on file    Attends meetings of clubs or organizations: Not on file    Relationship status: Not on file  Other Topics Concern  . Not on file  Social History Narrative   Started some exercise. Occ caffeine but not daily.    Family History: Family History  Problem Relation Age of Onset  . Breast cancer Mother 60  . Uterine cancer Mother   . Alcohol abuse Maternal Uncle   . Heart attack Maternal Uncle   . Stroke Maternal Uncle   . Lung cancer Maternal Grandfather   . Diabetes Maternal Aunt   . Hypertension Brother   . Hypertension Sister   . Diabetes Sister    Allergies: No Known Allergies Medications: See med rec.  Review of Systems: No fevers, chills, night sweats, weight loss, chest pain, or shortness of breath.   Objective:    General: Well Developed, well nourished, and in no acute distress.  Neuro: Alert and oriented x3, extra-ocular muscles intact, sensation grossly intact.  HEENT: Normocephalic, atraumatic, pupils equal round reactive to light, neck supple, no masses, no lymphadenopathy, thyroid nonpalpable.  Skin: Warm and  dry, no rashes. Cardiac: Regular rate and rhythm, no murmurs rubs or gallops, no lower extremity edema.  Respiratory: Clear to auscultation bilaterally. Not using accessory muscles, speaking in full sentences.  Impression and Recommendations:    Osteoarthritis of left midfoot Doing well after ankle joint injection, currently pain-free. Adding Celebrex to use for breakthrough pain. Return as needed, he does need another set of orthotics.  I spent 25 minutes with this patient, greater than 50% was face-to-face time counseling regarding the above diagnoses ___________________________________________ Gwen Her. Dianah Field, M.D., ABFM., CAQSM. Primary Care and [place] Instructor of [employer] [facility] [employer]

## 2017-10-31 ENCOUNTER — Encounter: Payer: Self-pay | Admitting: Sports Medicine

## 2017-10-31 ENCOUNTER — Ambulatory Visit (INDEPENDENT_AMBULATORY_CARE_PROVIDER_SITE_OTHER): Payer: Commercial Managed Care - PPO | Admitting: Sports Medicine

## 2017-10-31 DIAGNOSIS — M19072 Primary osteoarthritis, left ankle and foot: Secondary | ICD-10-CM

## 2017-10-31 NOTE — Assessment & Plan Note (Signed)
Now pain-free, we did an ankle joint injection at the last visit, he has not even had to use his Celebrex. Custom orthotics as above.

## 2017-10-31 NOTE — Progress Notes (Signed)
    Patient was fitted for a : standard, cushioned, semi-rigid orthotic. The orthotic was heated and afterward the patient stood on the orthotic blank positioned on the orthotic stand. The patient was positioned in subtalar neutral position and 10 degrees of ankle dorsiflexion in a weight bearing stance. After completion of molding, a stable base was applied to the orthotic blank. The blank was ground to a stable position for weight bearing. Size: 12 Base: White EVA Additional Posting and Padding: None The patient ambulated these, and they were very comfortable.  I spent 40 minutes with this patient, greater than 50% was face-to-face time counseling regarding the below diagnosis.  ___________________________________________ Thomas J. Thekkekandam, M.D., ABFM., CAQSM. Primary Care and Sports Medicine [facility]  Adjunct Instructor of Family Medicine  [employer]   

## 2017-12-16 ENCOUNTER — Other Ambulatory Visit: Payer: Self-pay | Admitting: Family Medicine

## 2017-12-19 ENCOUNTER — Encounter: Payer: Self-pay | Admitting: Physician Assistant

## 2017-12-19 ENCOUNTER — Ambulatory Visit (INDEPENDENT_AMBULATORY_CARE_PROVIDER_SITE_OTHER): Payer: Commercial Managed Care - PPO | Admitting: Physician Assistant

## 2017-12-19 VITALS — BP 136/69 | HR 73 | Temp 98.4°F | Wt 289.0 lb

## 2017-12-19 DIAGNOSIS — S30861A Insect bite (nonvenomous) of abdominal wall, initial encounter: Secondary | ICD-10-CM | POA: Diagnosis not present

## 2017-12-19 DIAGNOSIS — Z8719 Personal history of other diseases of the digestive system: Secondary | ICD-10-CM

## 2017-12-19 DIAGNOSIS — L918 Other hypertrophic disorders of the skin: Secondary | ICD-10-CM

## 2017-12-19 DIAGNOSIS — R110 Nausea: Secondary | ICD-10-CM | POA: Diagnosis not present

## 2017-12-19 DIAGNOSIS — M2550 Pain in unspecified joint: Secondary | ICD-10-CM

## 2017-12-19 DIAGNOSIS — R52 Pain, unspecified: Secondary | ICD-10-CM

## 2017-12-19 DIAGNOSIS — W57XXXA Bitten or stung by nonvenomous insect and other nonvenomous arthropods, initial encounter: Secondary | ICD-10-CM

## 2017-12-19 MED ORDER — DOXYCYCLINE HYCLATE 100 MG PO TABS: 100.0000 mg | ORAL_TABLET | Freq: Two times a day (BID) | ORAL | 0 refills | Status: DC

## 2017-12-19 NOTE — Progress Notes (Signed)
Subjective:    Patient ID: Frederick Yang, male    DOB: 1960-09-09, 57 y.o.   MRN: [id number]  HPI  Pt is a 57 yo male who presents to the clinic with a tick bite that he pulled off about 1 month ago. It was a small tick that likely had been attached for a few days. In the past 3 days he has started to have body aches, nausea, multiple joint aches and pains. No vomiting or diarrhea. No known rash. No known fever. He has had some abdominal cramping. Hx of diverticulitis but has no had a flare since 14inches of his colon was removed.   Pt has some skin tags that are really irritated right now. He would like removed.   .. Active Ambulatory Problems    Diagnosis Date Noted  . Hyperlipidemia 10/27/2006  . BACK PAIN WITH RADICULOPATHY 12/31/2009  . Impingement syndrome of right shoulder 09/10/2010  . Carpal tunnel syndrome of left wrist 03/18/2013  . Osteoarthritis, hand 03/18/2013  . Insertional Achilles tendinitis of left lower extremity 03/18/2013  . Severe obesity (BMI >= 40) ([place]) 03/22/2013  . Plantar fasciitis, bilateral 06/26/2014  . Osteoarthritis of left midfoot 11/03/2014  . Diabetes mellitus type 2, controlled ([place]) 05/28/2015  . Diverticulitis of sigmoid colon 06/05/2015  . Diverticulitis of large intestine with perforation without bleeding   . Blurry vision 08/16/2017  . Skin tag 12/20/2017  . History of diverticulitis 12/20/2017   Resolved Ambulatory Problems    Diagnosis Date Noted  . Obesity 08/03/2011  . Rectal bleeding 02/23/2015  . Prostatitis 05/13/2015  . Diverticulitis 06/04/2015   Past Medical History:  Diagnosis Date  . Diabetes mellitus without complication ([place])   . GERD (gastroesophageal reflux disease)   . Hyperlipidemia   . Prostate hypertrophy       Review of Systems  All other systems reviewed and are negative.      Objective:   Physical Exam  Constitutional: He is oriented to person, place, and time. He appears well-developed and  well-nourished.  HENT:  Head: Normocephalic and atraumatic.  Right Ear: External ear normal.  Left Ear: External ear normal.  Nose: Nose normal.  Mouth/Throat: Oropharynx is clear and moist.  Neck: Normal range of motion. Neck supple.  Cardiovascular: Normal rate and regular rhythm.  Pulmonary/Chest: Effort normal and breath sounds normal.  Abdominal: Soft. Bowel sounds are normal. He exhibits no distension and no mass. There is no tenderness. There is no rebound and no guarding. No hernia.  Lymphadenopathy:    He has no cervical adenopathy.  Neurological: He is alert and oriented to person, place, and time.  Skin:  Right axilla multiple skin tags.   Left posterior abdomen small erythematous papule with no other rash.   Psychiatric: He has a normal mood and affect. His behavior is normal.          Assessment & Plan:  Marland KitchenMarland KitchenDemetres was seen today for insect bite.  Diagnoses and all orders for this visit:  Tick bite of abdomen, initial encounter -     CBC w/Diff/Platelet -     B. burgdorfi antibodies -     Rocky mtn spotted fvr abs pnl(IgG+IgM) -     Ehrlichia Antibody Panel -     doxycycline (VIBRA-TABS) 100 MG tablet; Take 1 tablet (100 mg total) by mouth 2 (two) times daily. For 10 days.  Nausea -     CBC w/Diff/Platelet -     B. burgdorfi antibodies -  Rocky mtn spotted fvr abs pnl(IgG+IgM) -     Ehrlichia Antibody Panel -     doxycycline (VIBRA-TABS) 100 MG tablet; Take 1 tablet (100 mg total) by mouth 2 (two) times daily. For 10 days.  Body aches -     CBC w/Diff/Platelet -     B. burgdorfi antibodies -     Rocky mtn spotted fvr abs pnl(IgG+IgM) -     Ehrlichia Antibody Panel -     doxycycline (VIBRA-TABS) 100 MG tablet; Take 1 tablet (100 mg total) by mouth 2 (two) times daily. For 10 days.  Multiple joint pain -     CBC w/Diff/Platelet -     B. burgdorfi antibodies -     Rocky mtn spotted fvr abs pnl(IgG+IgM) -     Ehrlichia Antibody Panel -     doxycycline  (VIBRA-TABS) 100 MG tablet; Take 1 tablet (100 mg total) by mouth 2 (two) times daily. For 10 days.  Skin tag  History of diverticulitis -     CBC w/Diff/Platelet   DDx: viral illness, tick borne illness, diverticulitis flare.  Labs ordered today.  Will treat empirically for tick borne illness with doxycycline since symptomatic and tick attached for longer than 24 hours.  If abdominal pain increases and/or WBC elevated will consider treatment for diverticulitis.  Symptomatic care discussed.   Skin Tag Removal Procedure Note  Pre-operative Diagnosis: Classic skin tags (acrochordon)  Post-operative Diagnosis: Classic skin tags (acrochordon)  Locations:right axilla  Indications: irritated  Procedure Details  The risks (including bleeding and infection) and benefits of the procedure and Verbal informed consent obtained. Using sterile iris scissors, multiple skin tags were snipped off at their bases after cleansing with Betadine.  Bleeding was controlled by pressure.   Findings: Pathognomonic benign lesions  not sent for pathological exam.  Condition: Stable  Complications: none.  Plan: 1. Instructed to keep the wounds dry and covered for 24-48h and clean thereafter. 2. Warning signs of infection were reviewed.   3. Recommended that the patient use OTC acetaminophen as needed for pain.  4. Return as needed.

## 2017-12-19 NOTE — Patient Instructions (Signed)

## 2017-12-20 ENCOUNTER — Encounter: Payer: Self-pay | Admitting: Physician Assistant

## 2017-12-20 DIAGNOSIS — Z8719 Personal history of other diseases of the digestive system: Secondary | ICD-10-CM | POA: Insufficient documentation

## 2017-12-20 DIAGNOSIS — L918 Other hypertrophic disorders of the skin: Secondary | ICD-10-CM | POA: Insufficient documentation

## 2017-12-20 NOTE — Progress Notes (Signed)
Call pt: negative for lymes.

## 2017-12-20 NOTE — Progress Notes (Signed)
Tick borne illness labs still pending. CBC looks good.

## 2017-12-21 LAB — CBC WITH DIFFERENTIAL/PLATELET
Basophils Absolute: 61 cells/uL (ref 0–200)
Basophils Relative: 0.9 %
EOS PCT: 2.2 %
Eosinophils Absolute: 150 cells/uL (ref 15–500)
HCT: 41.5 % (ref 38.5–50.0)
HEMOGLOBIN: 13.9 g/dL (ref 13.2–17.1)
Lymphs Abs: 2428 cells/uL (ref 850–3900)
MCH: 28.3 pg (ref 27.0–33.0)
MCHC: 33.5 g/dL (ref 32.0–36.0)
MCV: 84.3 fL (ref 80.0–100.0)
MONOS PCT: 8.5 %
MPV: 11.8 fL (ref 7.5–12.5)
NEUTROS ABS: 3584 {cells}/uL (ref 1500–7800)
NEUTROS PCT: 52.7 %
Platelets: 250 10*3/uL (ref 140–400)
RBC: 4.92 10*6/uL (ref 4.20–5.80)
RDW: 12.1 % (ref 11.0–15.0)
Total Lymphocyte: 35.7 %
WBC mixed population: 578 cells/uL (ref 200–950)
WBC: 6.8 10*3/uL (ref 3.8–10.8)

## 2017-12-21 LAB — ROCKY MTN SPOTTED FVR ABS PNL(IGG+IGM)
RMSF IGG: NOT DETECTED
RMSF IGM: NOT DETECTED

## 2017-12-21 LAB — B. BURGDORFI ANTIBODIES: B burgdorferi Ab IgG+IgM: <0.9 index

## 2017-12-21 LAB — EHRLICHIA ANTIBODY PANEL
E. CHAFFEENSIS AB IGG: <1:64 {titer}
E. CHAFFEENSIS AB IGM: <1:20 {titer}

## 2017-12-21 NOTE — Progress Notes (Signed)
All tick borne illness were negative. Can stop antibiotic.

## 2017-12-29 IMAGING — US US ABDOMEN COMPLETE
1 series · 14 of 25 positions shown · non-contrast
Comparison: None.

CLINICAL DATA: Right upper quadrant pain for several days

EXAM:
ABDOMEN ULTRASOUND COMPLETE

[Series 1: us abdomen complete · 0.21mm/px · 14 of 73 slices shown]
[im 1/73]
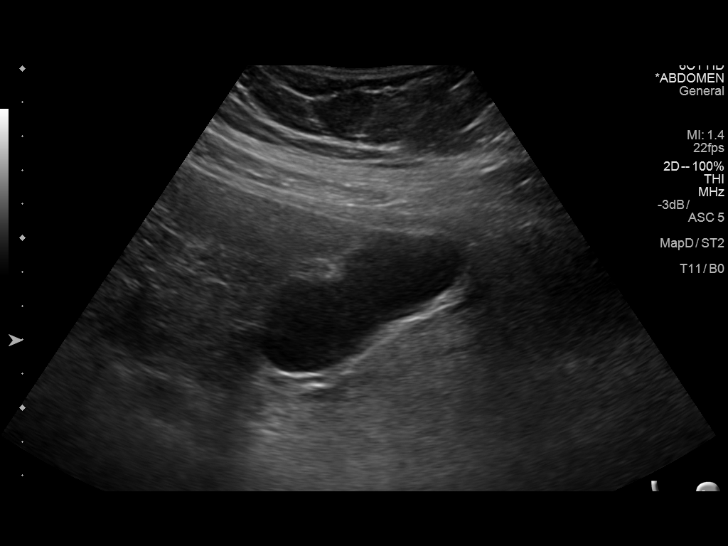
[im 7/73]
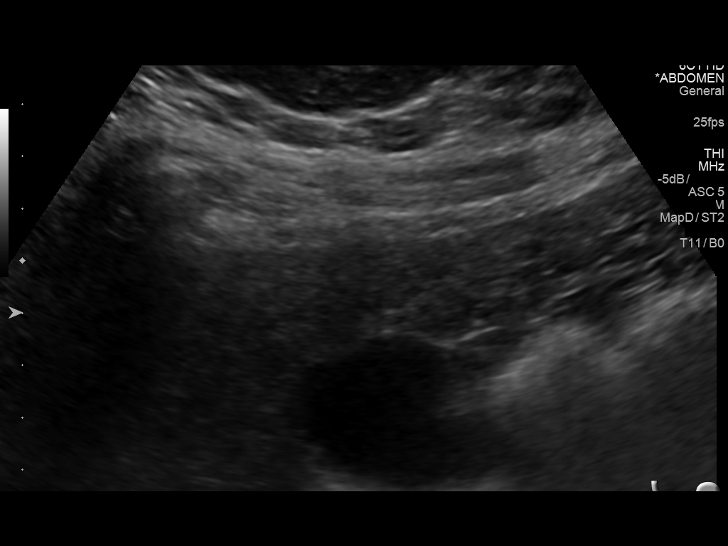
[im 13/73]
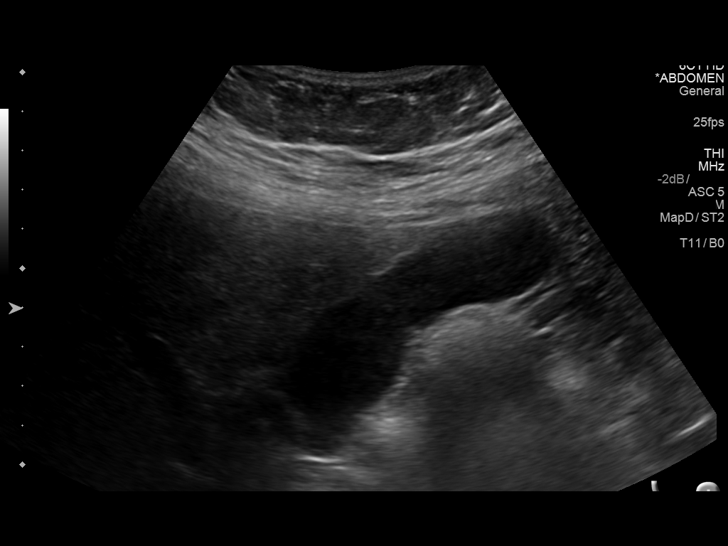
[im 19/73]
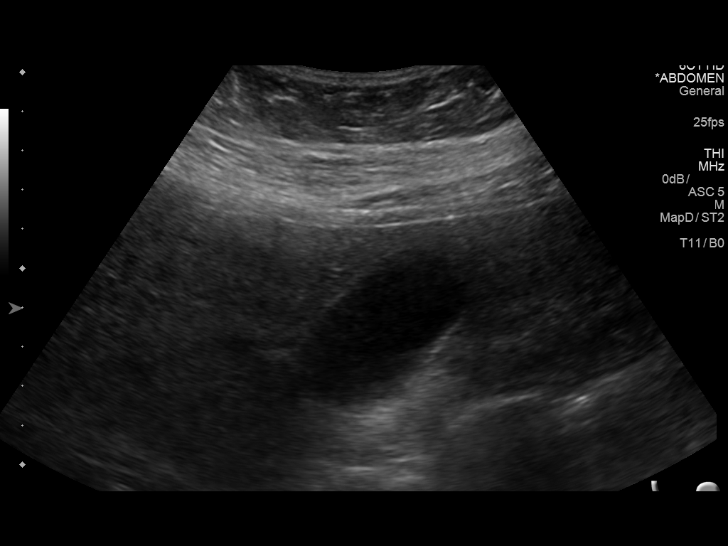
[im 25/73]
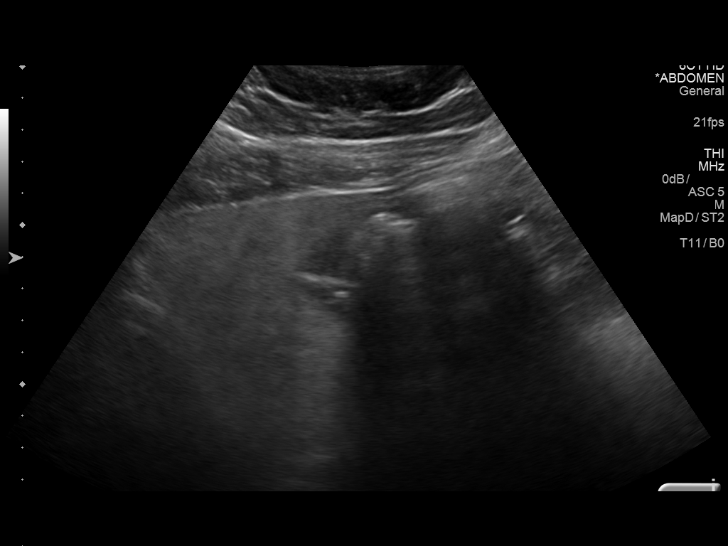
[im 28/73]
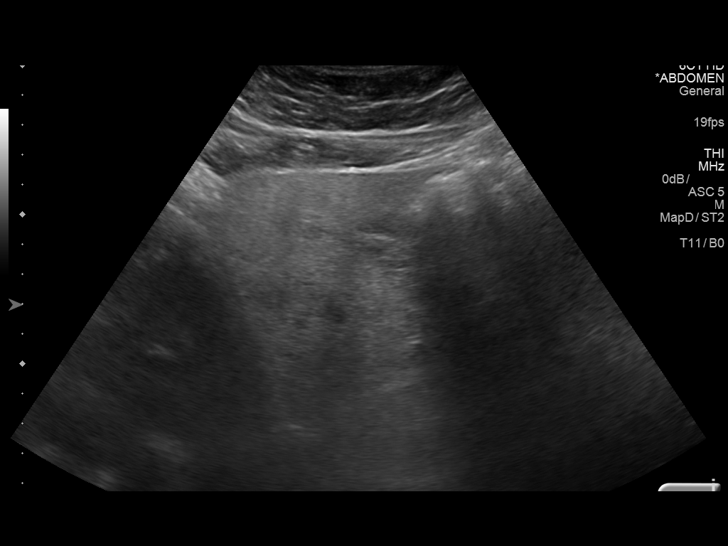
[im 34/73]
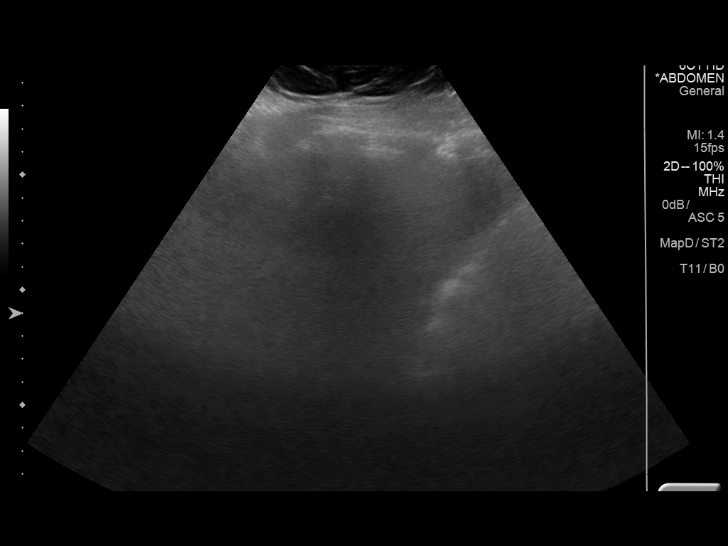
[im 40/73]
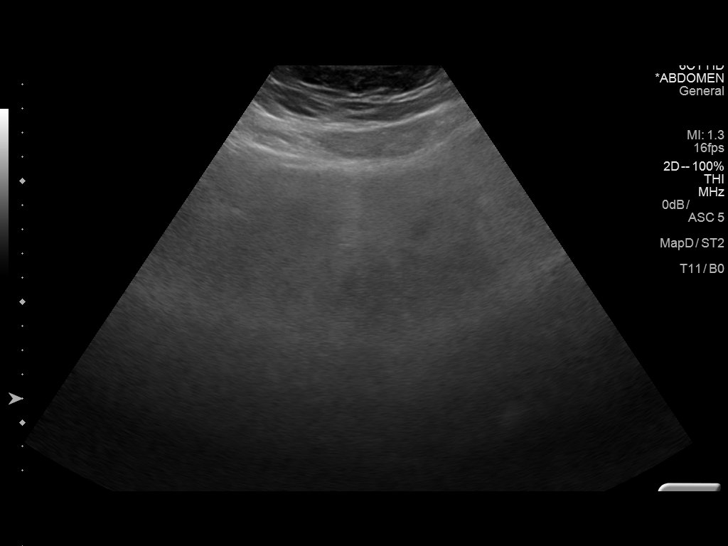
[im 46/73]
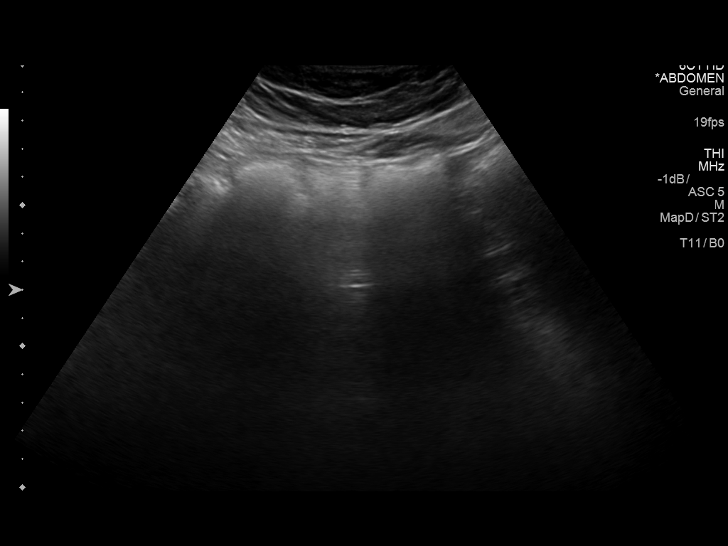
[im 49/73]
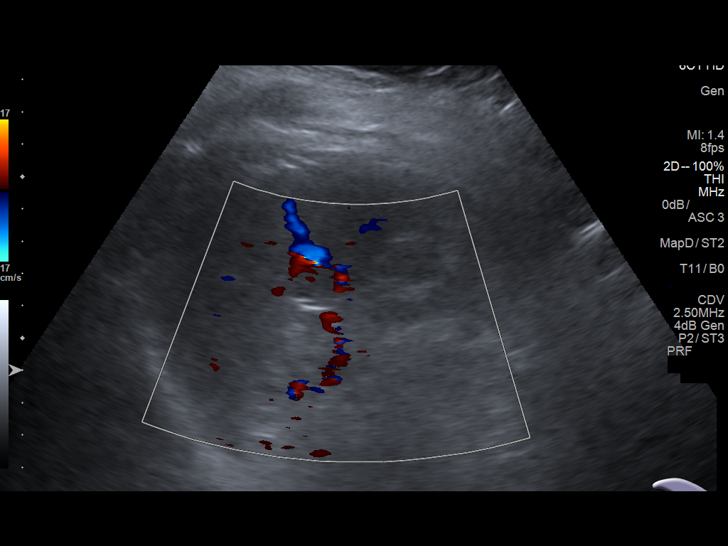
[im 55/73]
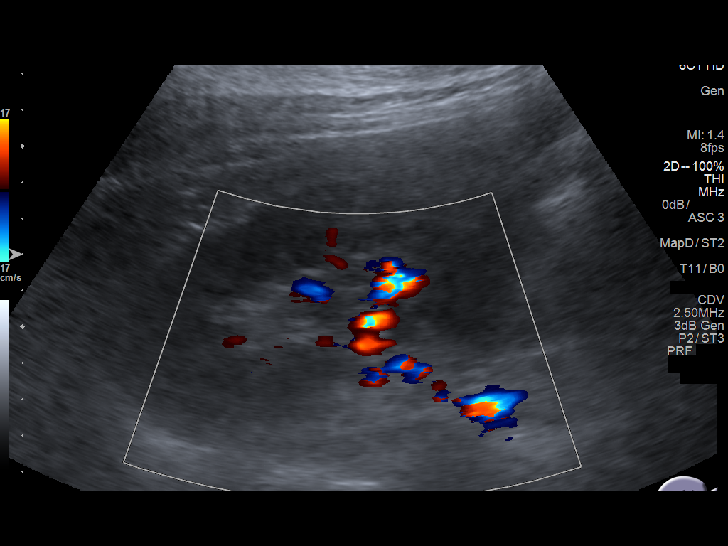
[im 61/73]
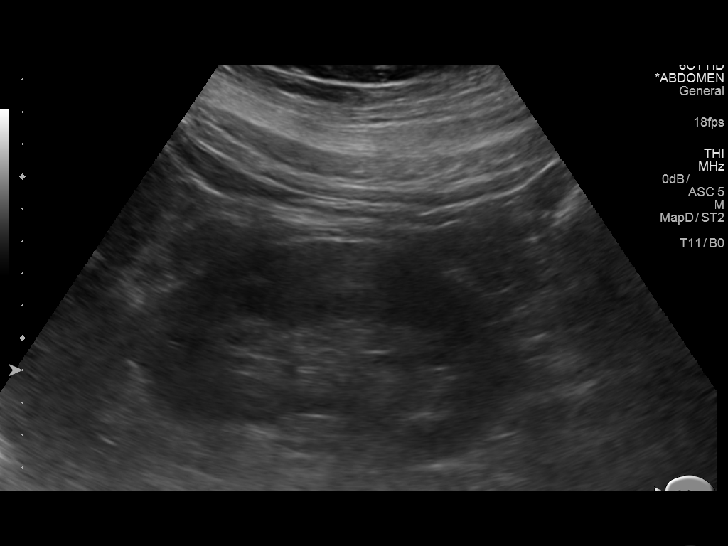
[im 67/73]
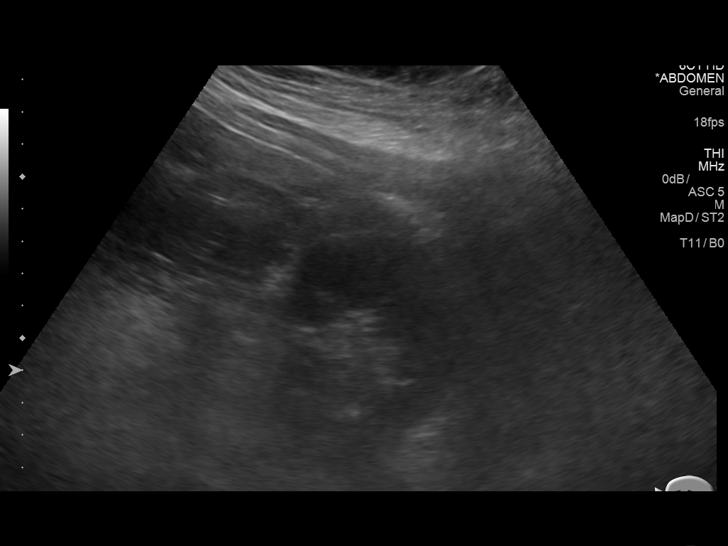
[im 73/73]
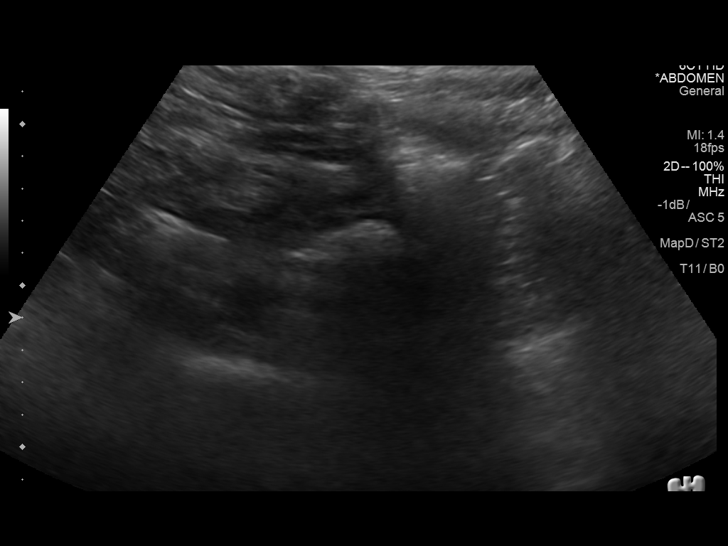

[14 of 25 positions shown; findings below may reference images not displayed]

FINDINGS: Gallbladder: No gallstones or wall thickening visualized. No
sonographic Murphy sign noted by sonographer.

Common bile duct: Diameter: 5 mm

Liver: Diffusely increased in echogenicity without focal mass.

IVC: No abnormality visualized.

Pancreas: Obscured by overlying bowel gas clear

Spleen: Size and appearance within normal limits.

Right Kidney: Length: 11.3 cm. Echogenicity within normal limits. No
mass or hydronephrosis visualized.

Left Kidney: Length: 11.7 cm. Echogenicity within normal limits. No
mass or hydronephrosis visualized.

Abdominal aorta: No aneurysm visualized.

Other findings: None.
IMPRESSION: Fatty liver.

No other focal abnormality is seen.

## 2018-01-07 ENCOUNTER — Other Ambulatory Visit: Payer: Self-pay | Admitting: Sports Medicine

## 2018-01-07 DIAGNOSIS — M19072 Primary osteoarthritis, left ankle and foot: Secondary | ICD-10-CM

## 2018-01-16 IMAGING — CT CT ABD-PELV W/ CM
2 of 5 series · 16 of 46 positions shown, 18 images · IV contrast (APPLIED)
Comparison: 06/04/2015

CLINICAL DATA: Lower abdominal pain.  History diverticulitis

EXAM:
CT ABDOMEN AND PELVIS WITH CONTRAST
TECHNIQUE: Multidetector CT imaging of the abdomen and pelvis was performed
using the standard protocol following bolus administration of
intravenous contrast.
CONTRAST:  100 mL Omnipaque 300 IV

[Series 2: axial st · axial · 0.94mm/px · z∈[-486,-60]mm · 13 of 96 slices shown, 15 images]
[im 6/96  soft-tissue]
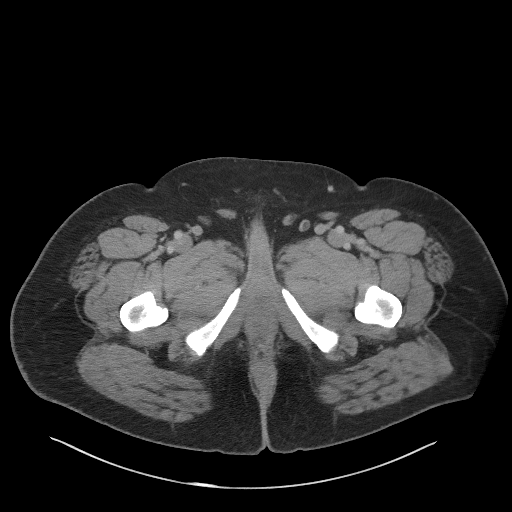
[im 6/96  bone]
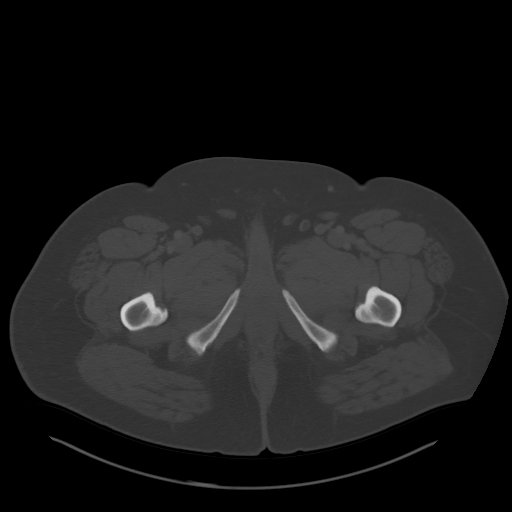
[im 16/96  soft-tissue]
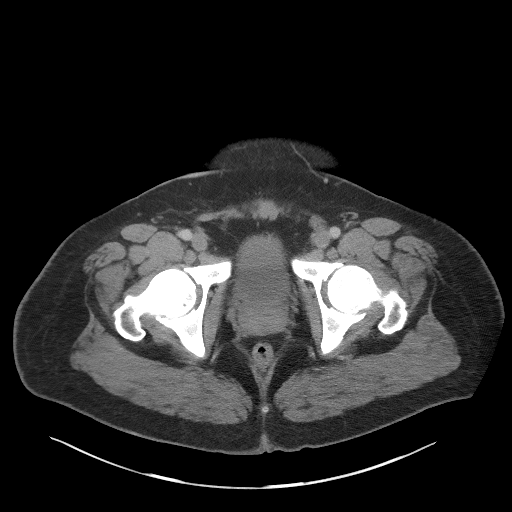
[im 21/96  soft-tissue]
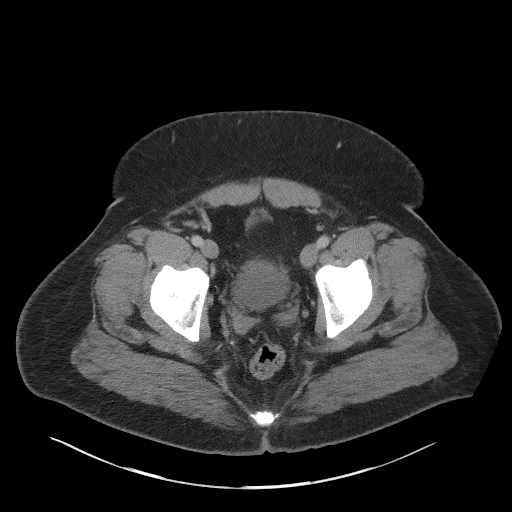
[im 26/96  soft-tissue]
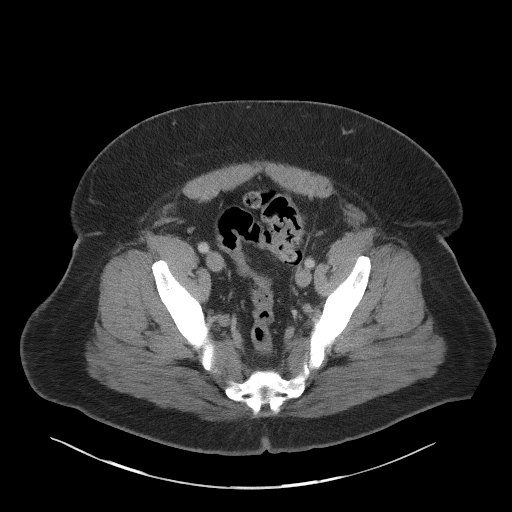
[im 36/96  soft-tissue]
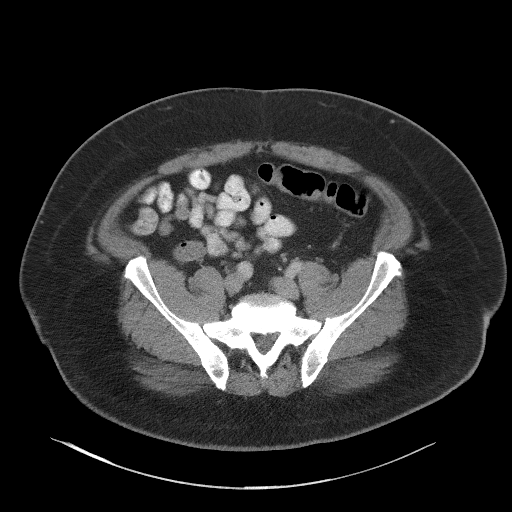
[im 41/96  soft-tissue]
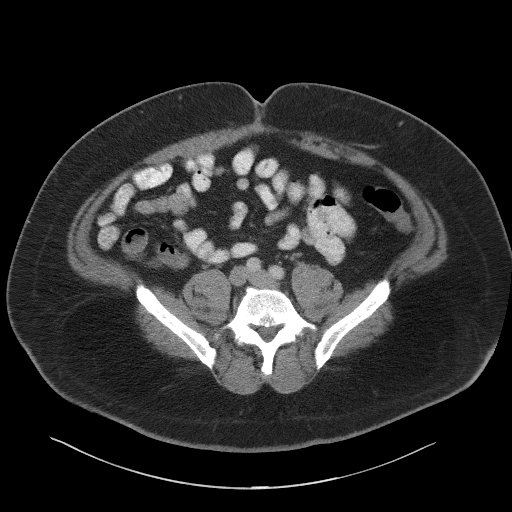
[im 51/96  soft-tissue]
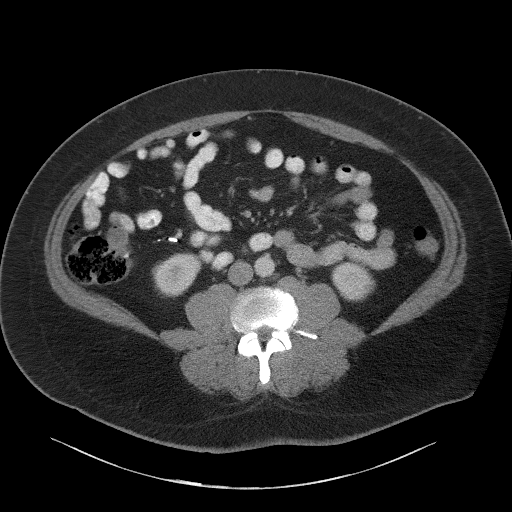
[im 56/96  soft-tissue]
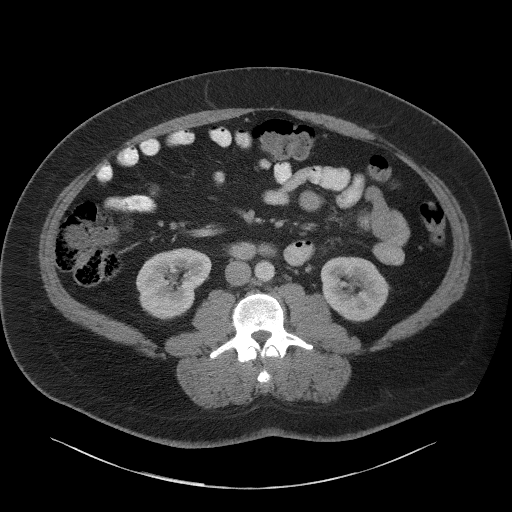
[im 61/96  soft-tissue]
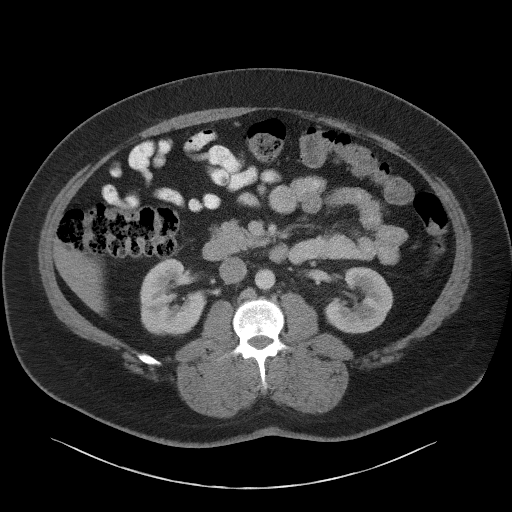
[im 61/96  bone]
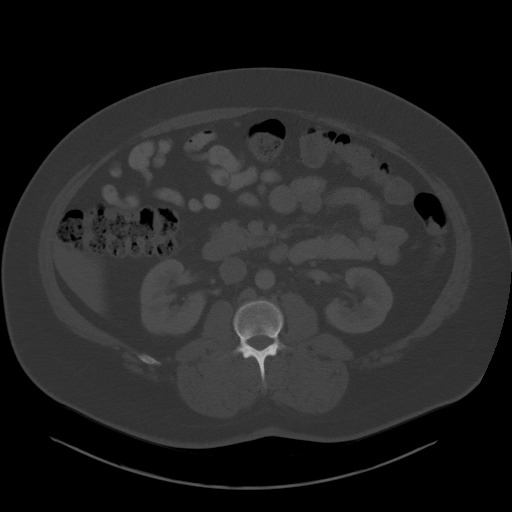
[im 71/96  soft-tissue]
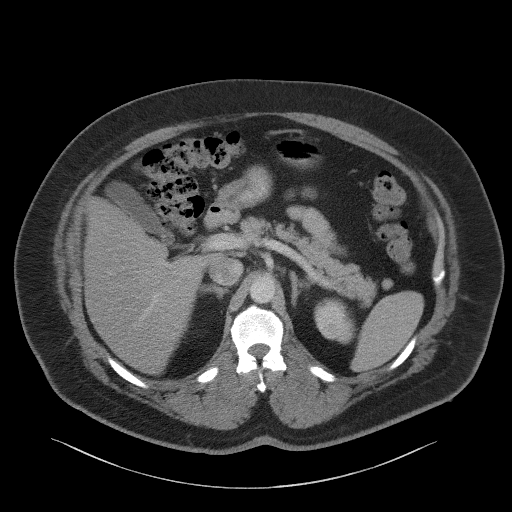
[im 76/96  soft-tissue]
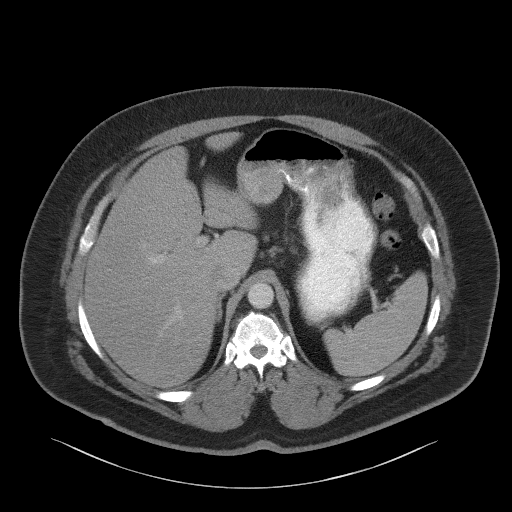
[im 81/96  soft-tissue]
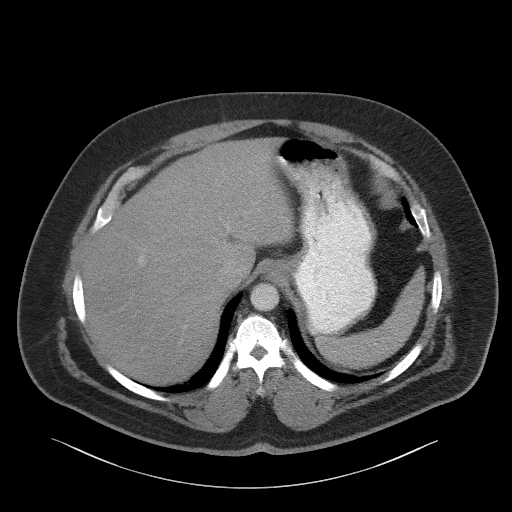
[im 91/96  soft-tissue]
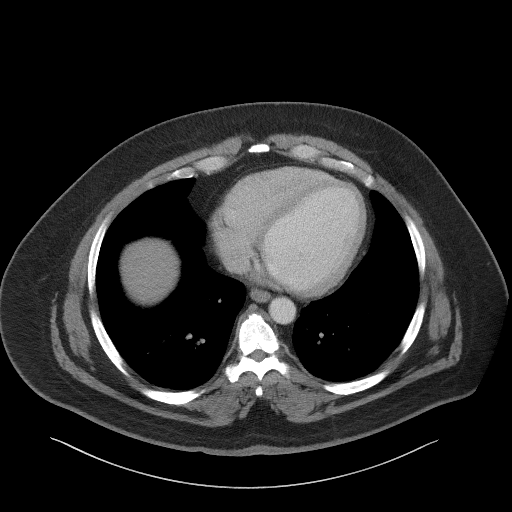

[Series 5: coronal st · coronal · 0.95mm/px · 3 of 116 slices shown]
[im 39/116  soft-tissue]
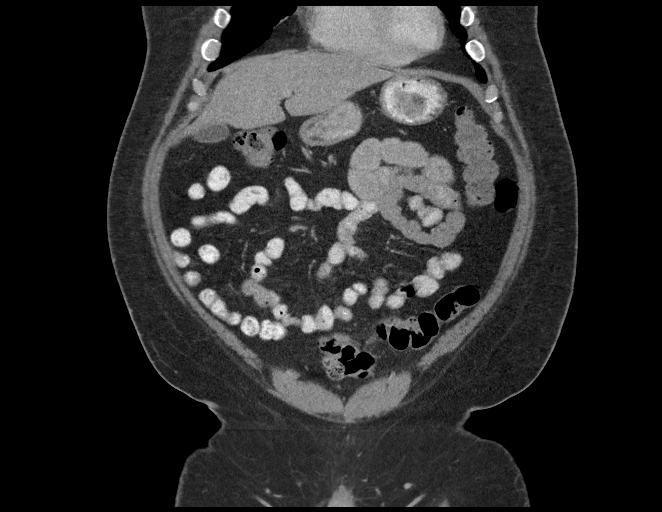
[im 52/116  soft-tissue]
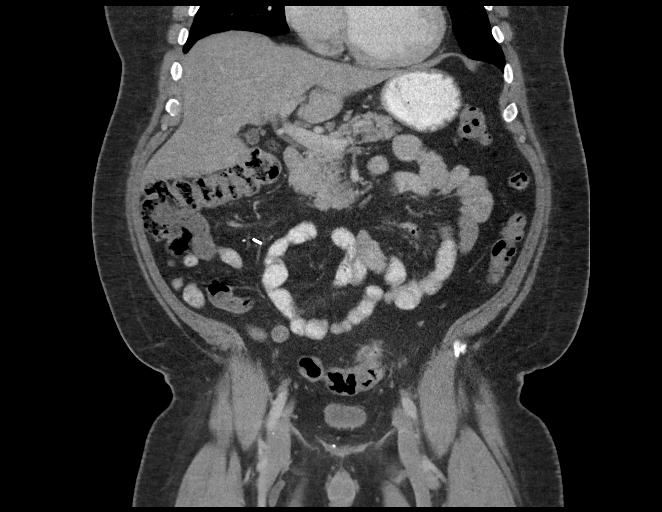
[im 64/116  soft-tissue]
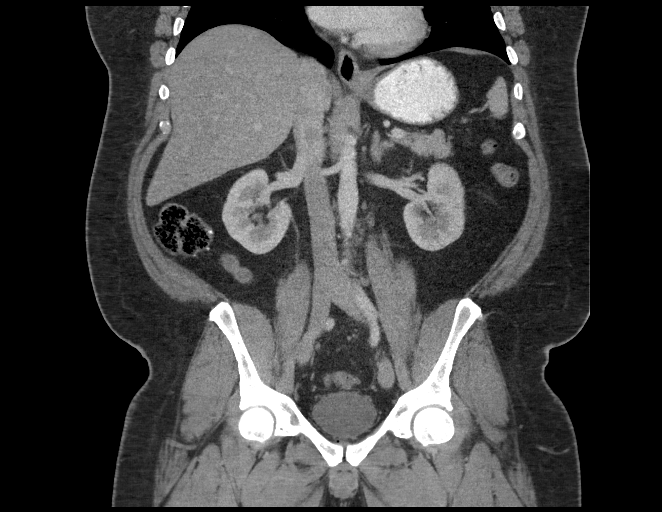

[16 of 46 positions shown; findings below may reference images not displayed]

FINDINGS: Lower chest:  Lung bases clear without infiltrate or effusion.

Hepatobiliary: Liver normal in size and contour without focal
lesion. Gallbladder normal. Bile ducts nondilated.

Pancreas: Negative

Spleen: Negative

Adrenals/Urinary Tract: Normal kidneys without mass or obstruction
or stone. Normal bladder.

Stomach/Bowel: Sigmoid diverticular change. There is mild stranding
in the pericolonic fat around the sigmoid colon compatible with
acute diverticulitis. This is the same area of diverticulitis as
seen on the prior study. No free air or free fluid. No abscess. No
bowel obstruction. Prior appendectomy.

Vascular/Lymphatic: Normal aorta and IVC.  No adenopathy.

Reproductive: Moderate prostate enlargement. Urinary bladder not
thickened.

Other: No free-fluidMusculoskeletal:

Mild lumbar degenerative change.  No acute bony abnormality.
IMPRESSION: Recurrent sigmoid diverticulitis. No abscess or perforation. This is
a relatively mild case of diverticulitis with less inflammation
compared with the prior CT.

## 2018-04-11 ENCOUNTER — Telehealth: Payer: Self-pay | Admitting: Family Medicine

## 2018-04-11 NOTE — Telephone Encounter (Signed)
Please call patient: He needs to schedule follow-up for diabetes.

## 2018-04-12 NOTE — Telephone Encounter (Signed)
Routing to scheduler

## 2018-04-12 NOTE — Telephone Encounter (Signed)
I called pt to schedule a diabetic F/u and patient states he will call back to schedule

## 2018-04-26 ENCOUNTER — Encounter: Payer: Self-pay | Admitting: Family Medicine

## 2018-04-26 ENCOUNTER — Ambulatory Visit (INDEPENDENT_AMBULATORY_CARE_PROVIDER_SITE_OTHER): Payer: Commercial Managed Care - PPO | Admitting: Family Medicine

## 2018-04-26 VITALS — BP 112/66 | HR 76 | Temp 98.3°F | Wt 288.0 lb

## 2018-04-26 DIAGNOSIS — E785 Hyperlipidemia, unspecified: Secondary | ICD-10-CM

## 2018-04-26 DIAGNOSIS — Z125 Encounter for screening for malignant neoplasm of prostate: Secondary | ICD-10-CM | POA: Diagnosis not present

## 2018-04-26 DIAGNOSIS — R7301 Impaired fasting glucose: Secondary | ICD-10-CM

## 2018-04-26 DIAGNOSIS — E119 Type 2 diabetes mellitus without complications: Secondary | ICD-10-CM

## 2018-04-26 DIAGNOSIS — J0100 Acute maxillary sinusitis, unspecified: Secondary | ICD-10-CM | POA: Diagnosis not present

## 2018-04-26 LAB — POCT UA - MICROALBUMIN
Albumin/Creatinine Ratio, Urine, POC: <30
CREATININE, POC: 300 mg/dL
MICROALBUMIN (UR) POC: 30 mg/L

## 2018-04-26 LAB — POCT GLYCOSYLATED HEMOGLOBIN (HGB A1C): HEMOGLOBIN A1C: 7.1 % — AB (ref 4.0–5.6)

## 2018-04-26 MED ORDER — AMOXICILLIN-POT CLAVULANATE 875-125 MG PO TABS: 1.0000 | ORAL_TABLET | Freq: Two times a day (BID) | ORAL | 0 refills | Status: DC

## 2018-04-26 NOTE — Progress Notes (Addendum)
Subjective:    Patient ID: GUSS FARRUGGIA, male    DOB: 1960/07/21, 57 y.o.   MRN: [id number]  HPI 57 yo c/o cold sxs x 1 week.  Started with facial pressure and ear pain x 2 days.  Taking Alkeseltzer Cold and Dayquail.  No chills or sweats.  He did have a nurse at work look at his ear and it she reported that it looked a little red in the right ear.  He is feeling a little better today.  Yesterday he was having a lot of pressure in the right side of his face in his right ear.  DM-no increased thirst or urination. No symptoms consistent with hypoglycemia.  He says a couple of months ago he actually checked his blood sugar and it was running in the 300s.  He says is never been that high and so since then he is been trying to get back on track with his diet and exercise.  He does take cinnamon capsules but no other prescription medications for his blood sugars.  Hyperlipidemia-currently on fish oil and niacin.  Not currently on a statin.   Review of Systems     BP 112/66   Pulse 76   Temp 98.3 F (36.8 C) (Oral)   Wt 288 lb (130.6 kg)   SpO2 100%   BMI 45.10 kg/m     No Known Allergies  Past Medical History:  Diagnosis Date  . Diabetes mellitus without complication ([place])   . GERD (gastroesophageal reflux disease)   . Hyperlipidemia   . Prostate hypertrophy     Past Surgical History:  Procedure Laterality Date  . APPENDECTOMY    . HEMORRHOID BANDING  06/24/13   Dr. Ludwig Lean  . HEMORRHOID BANDING  07/12/2013  . HERNIA REPAIR     RT groin  . right shoulder surgery  03/06/2014   Dr. Tamera Punt   . TREATMENT FISTULA ANAL      Social History   Socioeconomic History  . Marital status: Married    Spouse name: Not on file  . Number of children: 3   . Years of education: Not on file  . Highest education level: Not on file  Occupational History    Employer: [employer]  Social Needs  . Financial resource strain: Not on file  . Food insecurity:    Worry: Not on file     Inability: Not on file  . Transportation needs:    Medical: Not on file    Non-medical: Not on file  Tobacco Use  . Smoking status: Former Smoker    Types: Cigarettes    Last attempt to quit: 06/27/1988    Years since quitting: 29.8  . Smokeless tobacco: Never Used  Substance and Sexual Activity  . Alcohol use: Yes  . Drug use: No  . Sexual activity: Not on file  Lifestyle  . Physical activity:    Days per week: Not on file    Minutes per session: Not on file  . Stress: Not on file  Relationships  . Social connections:    Talks on phone: Not on file    Gets together: Not on file    Attends religious service: Not on file    Active member of club or organization: Not on file    Attends meetings of clubs or organizations: Not on file    Relationship status: Not on file  . Intimate partner violence:    Fear of current or ex partner: Not  on file    Emotionally abused: Not on file    Physically abused: Not on file    Forced sexual activity: Not on file  Other Topics Concern  . Not on file  Social History Narrative   Started some exercise. Occ caffeine but not daily.     Family History  Problem Relation Age of Onset  . Breast cancer Mother 50  . Uterine cancer Mother   . Alcohol abuse Maternal Uncle   . Heart attack Maternal Uncle   . Stroke Maternal Uncle   . Lung cancer Maternal Grandfather   . Diabetes Maternal Aunt   . Hypertension Brother   . Hypertension Sister   . Diabetes Sister     Outpatient Encounter Medications as of 04/26/2018  Medication Sig  . calcium-vitamin D (OSCAL WITH D) 500-200 MG-UNIT TABS tablet Take 1 tablet by mouth every evening.  . celecoxib (CELEBREX) 200 MG capsule 1 TO 2 TABLETS BY MOUTH DAILY AS NEEDED FOR PAIN.  Marland Kitchen Cinnamon 500 MG TABS Take 1,000 mg by mouth every evening.  . Flaxseed, Linseed, (FLAXSEED OIL) 1000 MG CAPS Take 1 capsule by mouth every evening.  . fluticasone (FLONASE) 50 MCG/ACT nasal spray SPRAY 2 SPRAYS INTO EACH  NOSTRIL EVERY DAY  . Garlic 1937 MG CAPS Take 1 capsule by mouth every evening.  Marland Kitchen MAGNESIUM PO Take 1 tablet by mouth daily.  . Misc Natural Products (GLUCOSAMINE CHONDROITIN TRIPLE PO) Take 2 tablets by mouth daily.  . Multiple Vitamin (MULTIVITAMIN) capsule Take 1 capsule by mouth every evening.  . niacin 500 MG tablet Take 1 tablet by mouth daily.  . Omega-3 Fatty Acids (FISH OIL) 1000 MG CAPS Take 1 capsule by mouth every evening.  . [DISCONTINUED] doxycycline (VIBRA-TABS) 100 MG tablet Take 1 tablet (100 mg total) by mouth 2 (two) times daily. For 10 days.   No facility-administered encounter medications on file as of 04/26/2018.       Objective:   Physical Exam  Constitutional: He is oriented to person, place, and time. He appears well-developed and well-nourished.  HENT:  Head: Normocephalic and atraumatic.  Right Ear: External ear normal.  Left Ear: External ear normal.  Nose: Nose normal.  Mouth/Throat: Oropharynx is clear and moist.  Right tympanic membrane is retracted.TMs and canals are clear.   Eyes: Pupils are equal, round, and reactive to light. Conjunctivae and EOM are normal.  Neck: Neck supple. No thyromegaly present.  Cardiovascular: Normal rate and normal heart sounds.  Pulmonary/Chest: Effort normal and breath sounds normal.  Lymphadenopathy:    He has no cervical adenopathy.  Neurological: He is alert and oriented to person, place, and time.  Skin: Skin is warm and dry.  Psychiatric: He has a normal mood and affect.        Assessment & Plan:  Acute sinusitis -22 has had symptoms for 10 days but he actually feels a little bit better today.  We will go ahead and send over antibiotic for him to fill only if he is not better in the next 24 to 48 hours.  Otherwise okay to continue over-the-counter symptomatic medications.  DM -Uncontrolled.  Hemoglobin A1c elevated 7.1 today.  We discussed options.  More recently he is been able to get his blood sugars back  under 130 with diet and exercise he wants to hold off on restarting medication and just plan to recheck in 3 months.  Hyperlipidemia-due to recheck lipid panel.

## 2018-04-26 NOTE — Addendum Note (Signed)
Addended by: Beatrice Lecher D on: 04/26/2018 05:35 PM   Modules accepted: Orders

## 2018-04-27 LAB — PSA: PSA: 4.8 ng/mL — ABNORMAL HIGH (ref <=4.0)

## 2018-04-27 LAB — LIPID PANEL
CHOL/HDL RATIO: 7.5 (calc) — AB (ref <5.0)
CHOLESTEROL: 261 mg/dL — AB (ref <200)
HDL: 35 mg/dL — AB (ref >40)
LDL CHOLESTEROL (CALC): 193 mg/dL — AB
NON-HDL CHOLESTEROL (CALC): 226 mg/dL — AB (ref <130)
Triglycerides: 167 mg/dL — ABNORMAL HIGH (ref <150)

## 2018-04-27 LAB — COMPLETE METABOLIC PANEL WITH GFR
AG Ratio: 1.4 (calc) (ref 1.0–2.5)
ALKALINE PHOSPHATASE (APISO): 70 U/L (ref 40–115)
ALT: 14 U/L (ref 9–46)
AST: 13 U/L (ref 10–35)
Albumin: 4.2 g/dL (ref 3.6–5.1)
BILIRUBIN TOTAL: 0.4 mg/dL (ref 0.2–1.2)
BUN: 12 mg/dL (ref 7–25)
CALCIUM: 9.3 mg/dL (ref 8.6–10.3)
CHLORIDE: 102 mmol/L (ref 98–110)
CO2: 26 mmol/L (ref 20–32)
Creat: 0.92 mg/dL (ref 0.70–1.33)
GFR, Est African American: 109 mL/min/{1.73_m2} (ref >=60)
GFR, Est Non African American: 94 mL/min/{1.73_m2} (ref >=60)
Globulin: 3 g/dL (calc) (ref 1.9–3.7)
Glucose, Bld: 119 mg/dL — ABNORMAL HIGH (ref 65–99)
Potassium: 4.5 mmol/L (ref 3.5–5.3)
Sodium: 135 mmol/L (ref 135–146)
Total Protein: 7.2 g/dL (ref 6.1–8.1)

## 2018-05-02 ENCOUNTER — Other Ambulatory Visit: Payer: Self-pay | Admitting: *Deleted

## 2018-05-02 DIAGNOSIS — R899 Unspecified abnormal finding in specimens from other organs, systems and tissues: Secondary | ICD-10-CM

## 2018-05-02 DIAGNOSIS — R972 Elevated prostate specific antigen [PSA]: Secondary | ICD-10-CM

## 2018-05-10 ENCOUNTER — Ambulatory Visit: Payer: Commercial Managed Care - PPO | Admitting: Family Medicine

## 2018-06-06 ENCOUNTER — Encounter: Payer: Self-pay | Admitting: Physician Assistant

## 2018-06-06 ENCOUNTER — Ambulatory Visit (INDEPENDENT_AMBULATORY_CARE_PROVIDER_SITE_OTHER): Payer: Commercial Managed Care - PPO | Admitting: Physician Assistant

## 2018-06-06 VITALS — BP 121/70 | HR 73 | Temp 98.1°F | Wt 295.0 lb

## 2018-06-06 DIAGNOSIS — K5900 Constipation, unspecified: Secondary | ICD-10-CM

## 2018-06-06 DIAGNOSIS — K869 Disease of pancreas, unspecified: Secondary | ICD-10-CM

## 2018-06-06 DIAGNOSIS — G8929 Other chronic pain: Secondary | ICD-10-CM

## 2018-06-06 DIAGNOSIS — R1032 Left lower quadrant pain: Secondary | ICD-10-CM

## 2018-06-06 DIAGNOSIS — Z9049 Acquired absence of other specified parts of digestive tract: Secondary | ICD-10-CM

## 2018-06-06 DIAGNOSIS — E119 Type 2 diabetes mellitus without complications: Secondary | ICD-10-CM

## 2018-06-06 DIAGNOSIS — R972 Elevated prostate specific antigen [PSA]: Secondary | ICD-10-CM | POA: Diagnosis not present

## 2018-06-06 DIAGNOSIS — Z860100 Personal history of colon polyps, unspecified: Secondary | ICD-10-CM

## 2018-06-06 DIAGNOSIS — K5790 Diverticulosis of intestine, part unspecified, without perforation or abscess without bleeding: Secondary | ICD-10-CM

## 2018-06-06 DIAGNOSIS — K760 Fatty (change of) liver, not elsewhere classified: Secondary | ICD-10-CM

## 2018-06-06 MED ORDER — PROBIOTIC ACIDOPHILUS PO CAPS: 1.0000 | ORAL_CAPSULE | Freq: Every day | ORAL | Status: AC

## 2018-06-06 MED ORDER — DOCUSATE SODIUM 100 MG PO CAPS: 100.0000 mg | ORAL_CAPSULE | Freq: Two times a day (BID) | ORAL | Status: DC | PRN

## 2018-06-06 NOTE — Progress Notes (Signed)
Subjective:    Patient ID: Frederick Yang, male    DOB: 12-Jul-1960, 57 y.o.   MRN: [id number]  HPI Patient is a 57 year old male with hx of diverticulitis, s/p sigmoid colectomy (11/2015), colon polyps, pancreatic lesion, hepatic steatosis, DM2, and severe obesity presenting today with complaints of abdominal pain and change in bowel movements.  He states that this pain is located in epigastric, left flank and LLQ. This is a recurrent problem; he was last seen on 08/22/16 for this. It is intermittent and feels like a cramp. It is relieved with time and does not have any identifiable triggers. He had a colon resection in 2017 due to hx of diverticulitis and sees [facility] in [place]. He eats a good diet and walks for exercise. He complains of some recent nausea, but no vomiting, Denies fever and chills. Denies recent NSAID use. He was prescribed Bentyl for this by his Gastroenterologist in 2018, but he does not remember if this was helpful.   His BMs are usually very regular and soft, 2-3 times per day. He had a large BM on Sunday and states that it was malodorous and was dark green/mushy. He did not have another bowel movement until today and it was small, but still green and mushy. Has not had increased gas recently. Denies hematochezia. He was on Augmentin one month ago for a sinus infection.  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 The biopsy came back benign, but patient reports that last scan was one year ago and would like another scan to follow up with it.  Review of Systems  Constitutional: Negative for chills, fatigue, fever and unexpected weight change.  HENT: Negative for congestion, postnasal drip, rhinorrhea, sinus pressure, sinus pain and sore throat.   Respiratory: Negative for cough and shortness of breath.   Cardiovascular: Negative for chest pain and palpitations.  Gastrointestinal: Positive for abdominal pain, constipation and  nausea. Negative for blood in stool, diarrhea, rectal pain and vomiting.  Genitourinary: Negative for difficulty urinating and dysuria.  Musculoskeletal: Negative for arthralgias.   Past Medical History:  Diagnosis Date  . Diabetes mellitus without complication ([place])   . GERD (gastroesophageal reflux disease)   . Hepatic steatosis   . Hyperlipidemia   . Prostate hypertrophy    Past Surgical History:  Procedure Laterality Date  . APPENDECTOMY    . COLON RESECTION SIGMOID  11/2015  . HEMORRHOID BANDING  06/24/13   Dr. Ludwig Lean  . HEMORRHOID BANDING  07/12/2013  . HERNIA REPAIR     RT groin  . right shoulder surgery  03/06/2014   Dr. Tamera Punt   . TREATMENT FISTULA ANAL     Current Outpatient Medications on File Prior to Visit  Medication Sig Dispense Refill  . calcium-vitamin D (OSCAL WITH D) 500-200 MG-UNIT TABS tablet Take 1 tablet by mouth every evening.    . celecoxib (CELEBREX) 200 MG capsule 1 TO 2 TABLETS BY MOUTH DAILY AS NEEDED FOR PAIN. 180 capsule 0  . Cinnamon 500 MG TABS Take 1,000 mg by mouth every evening.    . Flaxseed, Linseed, (FLAXSEED OIL) 1000 MG CAPS Take 1 capsule by mouth every evening.    . fluticasone (FLONASE) 50 MCG/ACT nasal spray SPRAY 2 SPRAYS INTO EACH NOSTRIL EVERY DAY 48 g 1  . Garlic 4081 MG CAPS Take 1 capsule by mouth every evening.    Marland Kitchen MAGNESIUM PO Take 1 tablet by mouth daily.    Marland Kitchen  Misc Natural Products (GLUCOSAMINE CHONDROITIN TRIPLE PO) Take 2 tablets by mouth daily.    . Multiple Vitamin (MULTIVITAMIN) capsule Take 1 capsule by mouth every evening.    . niacin 500 MG tablet Take 1 tablet by mouth daily.    . Omega-3 Fatty Acids (FISH OIL) 1000 MG CAPS Take 1 capsule by mouth every evening.     No current facility-administered medications on file prior to visit.    No Known Allergies     Objective:   Physical Exam  Constitutional: He is oriented to person, place, and time. He appears well-developed and well-nourished. No distress.   Cardiovascular: Normal rate, regular rhythm and normal heart sounds. Exam reveals no gallop and no friction rub.  No murmur heard. Pulmonary/Chest: Effort normal and breath sounds normal. No respiratory distress. He has no wheezes.  Abdominal: Soft. Bowel sounds are normal. He exhibits no distension and no mass. There is no rebound and no guarding.  Mild tenderness in LLQ. Patient describes as minimal discomfort  Neurological: He is alert and oriented to person, place, and time.  Psychiatric: He has a normal mood and affect. His behavior is normal. Judgment and thought content normal.   Vitals:   06/06/18 1553  BP: 121/70  Pulse: 73  Temp: 98.1 F (36.7 C)    CT Abdomen wo w IV contrast  Acute Interface, Incoming Rad Results - 04/06/2017  3:31 PM EDT INDICATION: pancreatic tail lesion, surveillance study,  TECHNIQUE: Axial CT through the abdomen prior to intravenous contrast followed by scans through the abdomen during the arterial, venous, and delayed phases with 80 mL of Isovue-370 intravenously.  Radiation dose reduction was utilized (automated exposure  control, mA or kV adjustment based on patient size, or iterative image reconstruction).  COMPARISON: 10/13/2015. There is a report from [facility] from 05/16/2016 but no images from that exam.  FINDINGS:  #  Liver: Fatty infiltration throughout the liver. No hepatic mass or ductal dilatation. #  Gallbladder: Unremarkable #  Spleen: Normal #  Pancreas: Just anterior and inferior to the tail of the pancreas, best visualized on image 67 of series 103, there is a 1.6 x 1.2 cm nonenhancing nodule that appears separate from the pancreas and was not present on 10/13/2015 but there was a reported  "2.3 x 1.4 cm solid nodule, possibly associated with the tail of the pancreas" on the scan of 05/16/2016 from [facility]. There is an 12 x 11 mm nonenhancing nodular density just distal to the tail of pancreas best seen on image 63 of series 103  which  is slightly less prominent than 10/13/2015. #  Adrenal Glands: Normal #  Kidneys: Kidneys are without hydronephrosis or mass. #  Retroperitoneum: No adenopathy. #  GI Tract: No bowel dilatation or bowel wall thickening. #  Vascular: No aneurysm. #  Lung Bases: Normal #  Musculoskeletal: No acute bony abnormalities.    IMPRESSION:  1. 1.6 x 1.2 cm nonenhancing nodule adjacent to the tail of the pancreas that has increased from 10/13/2015. However, on the patient's CT from [facility] there is a reported "2.3 x 1.4 cm solid nodule, possibly associated with the tail of the pancreas."   There is also a 12 x 11 mm nodular density just distal to the tail of the pancreas which is slightly less prominent than 10/13/2015. Both of these nodules appear separate from the pancreas and may represent small lymph nodes. A follow-up scan in 6-12  months will be useful to document  stability. 2. Hepatic steatosis.   Electronically Signed by: Danella Deis  Lab Results  Component Value Date   CREATININE 0.92 04/27/2018   BUN 12 04/27/2018   NA 135 04/27/2018   K 4.5 04/27/2018   CL 102 04/27/2018   CO2 26 04/27/2018   Lab Results  Component Value Date   ALT 14 04/27/2018   AST 13 04/27/2018   ALKPHOS 66 02/07/2017   BILITOT 0.4 04/27/2018   Lab Results  Component Value Date   HGBA1C 7.1 (A) 04/26/2018   Lab Results  Component Value Date   PSA 4.8 (H) 04/27/2018   PSA 3.9 08/31/2016   PSA 3.98 08/04/2015   Lab Results  Component Value Date   WBC 6.8 12/19/2017   HGB 13.9 12/19/2017   HCT 41.5 12/19/2017   MCV 84.3 12/19/2017   PLT 250 12/19/2017        Assessment & Plan:  Marland KitchenMarland KitchenDiagnoses and all orders for this visit:  Constipation, unspecified constipation type -     Lactobacillus (PROBIOTIC ACIDOPHILUS) CAPS; Take 1 capsule by mouth daily for 14 days. 50 billion units -     docusate sodium (COLACE) 100 MG capsule; Take 1 capsule (100 mg total) by mouth 2 (two) times daily  as needed for mild constipation.  Abdominal pain, chronic, left lower quadrant -     CBC with Differential/Platelet -     COMPLETE METABOLIC PANEL WITH GFR -     Lipase -     Lactobacillus (PROBIOTIC ACIDOPHILUS) CAPS; Take 1 capsule by mouth daily for 14 days. 50 billion units -     docusate sodium (COLACE) 100 MG capsule; Take 1 capsule (100 mg total) by mouth 2 (two) times daily as needed for mild constipation.  Elevated PSA, less than 10 ng/ml -     PSA, total and free  Diabetes mellitus without complication (HCC) -     Hemoglobin A1c  Pancreatic lesion   Personally reviewed recent labs and CT Abdomen report from 04/06/2017 Abnormalities noted above Since we are ordering labs today for abdominal pain, will also obtain A1C and PSA for PCP Advised to schedule f/u with PCP for diabetes   Recurrent left-sided abdominal pain: Afebrile, no tachypnea, no tachycardia, benign abdominal exam History of diverticulosis, colon polyps and diverticulitis, last flare May 2017. Given history and PE today and s/p sigmoid colectomy, low suspicion for acute diverticulitis CBC, CMP, lipase pending Encouraged close follow up w/ GI (Dr. Rowe Clack) to monitor pancreatic cyst and discuss recurrent abdominal pain Return precautions discussed  Constipation: Start probiotic (recent antibiotic use), colace and increased dietary fiber. If no BM in the next couple days start miralax as well. Discuss with GI if no improvement.  Patient education and anticipatory guidance given Patient agrees with treatment plan Follow-up as needed if symptoms worsen or fail to improve  Darlyne Russian PA-C

## 2018-06-06 NOTE — Patient Instructions (Addendum)
Start Probiotic daily for the next 2 weeks Start stool softener twice a day until having normal BM If not responding to stool softener, start Miralax  Follow-up with [facility]

## 2018-06-10 ENCOUNTER — Encounter: Payer: Self-pay | Admitting: Physician Assistant

## 2018-06-10 DIAGNOSIS — K5790 Diverticulosis of intestine, part unspecified, without perforation or abscess without bleeding: Secondary | ICD-10-CM | POA: Insufficient documentation

## 2018-06-10 DIAGNOSIS — Z9049 Acquired absence of other specified parts of digestive tract: Secondary | ICD-10-CM | POA: Insufficient documentation

## 2018-06-10 DIAGNOSIS — K869 Disease of pancreas, unspecified: Secondary | ICD-10-CM | POA: Insufficient documentation

## 2018-06-10 DIAGNOSIS — Z86010 Personal history of colonic polyps: Secondary | ICD-10-CM | POA: Insufficient documentation

## 2018-06-10 DIAGNOSIS — K760 Fatty (change of) liver, not elsewhere classified: Secondary | ICD-10-CM | POA: Insufficient documentation

## 2018-07-26 ENCOUNTER — Ambulatory Visit: Payer: Commercial Managed Care - PPO | Admitting: Family Medicine

## 2018-07-31 ENCOUNTER — Telehealth: Payer: Self-pay | Admitting: Family Medicine

## 2018-07-31 ENCOUNTER — Ambulatory Visit (INDEPENDENT_AMBULATORY_CARE_PROVIDER_SITE_OTHER): Payer: Commercial Managed Care - PPO | Admitting: Family Medicine

## 2018-07-31 ENCOUNTER — Encounter: Payer: Self-pay | Admitting: Family Medicine

## 2018-07-31 VITALS — BP 121/62 | HR 76 | Ht 67.0 in | Wt 292.0 lb

## 2018-07-31 DIAGNOSIS — E785 Hyperlipidemia, unspecified: Secondary | ICD-10-CM | POA: Diagnosis not present

## 2018-07-31 DIAGNOSIS — E1165 Type 2 diabetes mellitus with hyperglycemia: Secondary | ICD-10-CM | POA: Diagnosis not present

## 2018-07-31 LAB — POCT GLYCOSYLATED HEMOGLOBIN (HGB A1C): Hemoglobin A1C: 8.7 % — AB (ref 4.0–5.6)

## 2018-07-31 MED ORDER — METOPROLOL SUCCINATE ER 25 MG PO TB24: 25.0000 mg | ORAL_TABLET | Freq: Every day | ORAL | 2 refills | Status: DC

## 2018-07-31 NOTE — Progress Notes (Signed)
Subjective:    CC: CM check up   HPI:  Diabetes - no hypoglycemic events. No wounds or sores that are not healing well. No increased thirst or urination. Checking glucose at home. Taking medications as prescribed without any side effects.  He has not been exercising as regularly.  His last A1c was elevated at 7.1.  Hyperlipidemia-his last lipid panel in November showed an LDL that was quite elevated.  Had recommended that we start a statin.  He is not currently on a statin.  Past medical history, Surgical history, Family history not pertinant except as noted below, Social history, Allergies, and medications have been entered into the medical record, reviewed, and corrections made.   Review of Systems: No fevers, chills, night sweats, weight loss, chest pain, or shortness of breath.   Objective:    General: Well Developed, well nourished, and in no acute distress.  Neuro: Alert and oriented x3, extra-ocular muscles intact, sensation grossly intact.  HEENT: Normocephalic, atraumatic  Skin: Warm and dry, no rashes. Cardiac: Regular rate and rhythm, no murmurs rubs or gallops, no lower extremity edema.  Respiratory: Clear to auscultation bilaterally. Not using accessory muscles, speaking in full sentences.   Impression and Recommendations:    DM -uncontrolled.  We discussed options.  I like to go ahead and start him on low-dose metformin.  We will definitely need to keep an eye on any GI symptoms or bowel symptoms.  He will let me know if he has any problems and send me a note in the next couple weeks.  He also plans on restarting his cinnamon and getting back on track with regular exercise which would also help lower the A1c. Lab Results  Component Value Date   HGBA1C 8.7 (A) 07/31/2018   Hyperlipidemia-I would encourage him to consider taking a statin based on his elevated levels.  Encouraged him to think about it and let me know.

## 2018-07-31 NOTE — Telephone Encounter (Signed)
Please call patient: In reviewing his labs from November I had put a note on there about possibly starting a statin to lower his cholesterol.  I know he is taking some flaxseed and some things like that but it was extremely high.  High enough to increase his risk for stroke and heart attack so I would really like for him to consider taking a statin.

## 2018-07-31 NOTE — Progress Notes (Signed)
vv

## 2018-08-01 NOTE — Telephone Encounter (Signed)
Attempted to contact Pt, no answer.

## 2018-08-02 MED ORDER — ATORVASTATIN CALCIUM 20 MG PO TABS: 20.0000 mg | ORAL_TABLET | Freq: Every day | ORAL | 3 refills | Status: DC

## 2018-08-02 MED ORDER — METFORMIN HCL ER 500 MG PO TB24: 500.0000 mg | ORAL_TABLET | Freq: Every day | ORAL | 5 refills | Status: DC

## 2018-08-02 NOTE — Addendum Note (Signed)
Addended by: Beatrice Lecher D on: 08/02/2018 08:28 AM   Modules accepted: Orders

## 2018-08-02 NOTE — Telephone Encounter (Signed)
Patient agreed to start a cholesterol lowering medication.

## 2018-08-02 NOTE — Telephone Encounter (Signed)
rx sent

## 2018-08-17 ENCOUNTER — Ambulatory Visit (INDEPENDENT_AMBULATORY_CARE_PROVIDER_SITE_OTHER): Payer: Commercial Managed Care - PPO | Admitting: Sports Medicine

## 2018-08-17 ENCOUNTER — Encounter: Payer: Self-pay | Admitting: Sports Medicine

## 2018-08-17 VITALS — BP 104/69 | HR 87 | Temp 99.4°F | Wt 287.0 lb

## 2018-08-17 DIAGNOSIS — R509 Fever, unspecified: Secondary | ICD-10-CM | POA: Diagnosis not present

## 2018-08-17 DIAGNOSIS — J101 Influenza due to other identified influenza virus with other respiratory manifestations: Secondary | ICD-10-CM | POA: Diagnosis not present

## 2018-08-17 LAB — POCT INFLUENZA A/B
Influenza A, POC: NEGATIVE
Influenza B, POC: POSITIVE — AB

## 2018-08-17 MED ORDER — OSELTAMIVIR PHOSPHATE 75 MG PO CAPS: 75.0000 mg | ORAL_CAPSULE | Freq: Two times a day (BID) | ORAL | 0 refills | Status: DC

## 2018-08-17 NOTE — Assessment & Plan Note (Addendum)
Symptomatic treatment, over-the-counter cold and flu medications. Tamiflu. Return if no better after a week and a half.

## 2018-08-17 NOTE — Patient Instructions (Addendum)

## 2018-08-17 NOTE — Progress Notes (Signed)
Subjective:    CC: Feeling sick  HPI: Frederick Yang is a pleasant 58 year old male, 2 days he has had a moderate cough, headaches, fevers up to 100 Fahrenheit.  No shortness of breath, chest pain, nausea, vomiting, diarrhea, no skin rash.  It is are moderate, persistent.  No real sore throat, no ear pain or sinus pressure.  Feels somewhat fatigued, mild myalgias.  Principal symptom is cough.  I reviewed the past medical history, family history, social history, surgical history, and allergies today and no changes were needed.  Please see the problem list section below in epic for further details.  Past Medical History: Past Medical History:  Diagnosis Date  . Diabetes mellitus without complication ([place])   . GERD (gastroesophageal reflux disease)   . Hepatic steatosis   . Hyperlipidemia   . Prostate hypertrophy    Past Surgical History: Past Surgical History:  Procedure Laterality Date  . APPENDECTOMY    . COLON RESECTION SIGMOID  11/2015  . HEMORRHOID BANDING  06/24/13   Dr. Ludwig Lean  . HEMORRHOID BANDING  07/12/2013  . HERNIA REPAIR     RT groin  . right shoulder surgery  03/06/2014   Dr. Tamera Punt   . TREATMENT FISTULA ANAL     Social History: Social History   Socioeconomic History  . Marital status: Married    Spouse name: Not on file  . Number of children: 3   . Years of education: Not on file  . Highest education level: Not on file  Occupational History    Employer: [employer]  Social Needs  . Financial resource strain: Not on file  . Food insecurity:    Worry: Not on file    Inability: Not on file  . Transportation needs:    Medical: Not on file    Non-medical: Not on file  Tobacco Use  . Smoking status: Former Smoker    Types: Cigarettes    Last attempt to quit: 06/27/1988    Years since quitting: 30.1  . Smokeless tobacco: Never Used  Substance and Sexual Activity  . Alcohol use: Yes  . Drug use: No  . Sexual activity: Not on file  Lifestyle  .  Physical activity:    Days per week: Not on file    Minutes per session: Not on file  . Stress: Not on file  Relationships  . Social connections:    Talks on phone: Not on file    Gets together: Not on file    Attends religious service: Not on file    Active member of club or organization: Not on file    Attends meetings of clubs or organizations: Not on file    Relationship status: Not on file  Other Topics Concern  . Not on file  Social History Narrative   Started some exercise. Occ caffeine but not daily.    Family History: Family History  Problem Relation Age of Onset  . Breast cancer Mother 70  . Uterine cancer Mother   . Alcohol abuse Maternal Uncle   . Heart attack Maternal Uncle   . Stroke Maternal Uncle   . Lung cancer Maternal Grandfather   . Diabetes Maternal Aunt   . Hypertension Brother   . Hypertension Sister   . Diabetes Sister    Allergies: No Known Allergies Medications: See med rec.  Review of Systems: No fevers, chills, night sweats, weight loss, chest pain, or shortness of breath.   Objective:    General: Well Developed, well  nourished, and in no acute distress.  Neuro: Alert and oriented x3, extra-ocular muscles intact, sensation grossly intact.  HEENT: Normocephalic, atraumatic, pupils equal round reactive to light, neck supple, no masses, no lymphadenopathy, thyroid nonpalpable.  Nasopharynx, ear canals unremarkable, no facial pressure, only minimally erythematous pharyngeal erythema.  Only mild tonsillar hypertrophy, no exudates. Skin: Warm and dry, no rashes. Cardiac: Regular rate and rhythm, no murmurs rubs or gallops, no lower extremity edema.  Respiratory: Clear to auscultation bilaterally. Not using accessory muscles, speaking in full sentences.  Positive Influenza B.  Impression and Recommendations:    Influenza B Symptomatic treatment, over-the-counter cold and flu medications. Tamiflu. Return if no better after a week and a  half. ___________________________________________ Gwen Her. Dianah Field, M.D., ABFM., CAQSM. Primary Care and Sports Medicine  [facility]  Adjunct Professor of [place] of [facility] [employer]

## 2018-08-24 LAB — HM DIABETES EYE EXAM

## 2018-09-14 ENCOUNTER — Telehealth: Payer: Self-pay

## 2018-09-14 ENCOUNTER — Ambulatory Visit (INDEPENDENT_AMBULATORY_CARE_PROVIDER_SITE_OTHER): Payer: Commercial Managed Care - PPO | Admitting: Sports Medicine

## 2018-09-14 ENCOUNTER — Encounter: Payer: Self-pay | Admitting: Sports Medicine

## 2018-09-14 ENCOUNTER — Other Ambulatory Visit: Payer: Self-pay

## 2018-09-14 ENCOUNTER — Ambulatory Visit (INDEPENDENT_AMBULATORY_CARE_PROVIDER_SITE_OTHER): Payer: Commercial Managed Care - PPO

## 2018-09-14 DIAGNOSIS — R0602 Shortness of breath: Secondary | ICD-10-CM | POA: Diagnosis not present

## 2018-09-14 DIAGNOSIS — R05 Cough: Secondary | ICD-10-CM

## 2018-09-14 NOTE — Telephone Encounter (Signed)
Patient scheduled.

## 2018-09-14 NOTE — Assessment & Plan Note (Signed)
Chest x-ray, labs including d-dimer. This is likely just carryover from his influenza weeks ago. Clinically stable, exam is for the most part benign.

## 2018-09-14 NOTE — Telephone Encounter (Signed)
Frederick Yang called and states he has some chest tightness and cough. He has had no known exposure to anyone testing positive for COVID-19. He has not traveled. Denies sore throat, fever, chills, sweats or body aches. He did have the flu a couple weeks ago. Please advise on next steps?

## 2018-09-14 NOTE — Telephone Encounter (Signed)
If having shortness of breath patient should be evaluated in clinic.  If shortness of breath is not that significant and he should proceed with the visit for possible drive-through testing.

## 2018-09-14 NOTE — Progress Notes (Signed)
Subjective:    CC: Mild shortness of breath  HPI: This is a pleasant 58 year old male, we treated him for influenza B about 3 weeks ago.  Unfortunately he has continued to have very mild baseline shortness of breath, mild cough, nonproductive.  No fevers, chills, muscle aches, body aches, no GI symptoms, no skin rash.  No orthopnea, no PND.   I reviewed the past medical history, family history, social history, surgical history, and allergies today and no changes were needed.  Please see the problem list section below in epic for further details.  Past Medical History: Past Medical History:  Diagnosis Date  . Diabetes mellitus without complication ([place])   . GERD (gastroesophageal reflux disease)   . Hepatic steatosis   . Hyperlipidemia   . Prostate hypertrophy    Past Surgical History: Past Surgical History:  Procedure Laterality Date  . APPENDECTOMY    . COLON RESECTION SIGMOID  11/2015  . HEMORRHOID BANDING  06/24/13   Dr. Ludwig Lean  . HEMORRHOID BANDING  07/12/2013  . HERNIA REPAIR     RT groin  . right shoulder surgery  03/06/2014   Dr. Tamera Punt   . TREATMENT FISTULA ANAL     Social History: Social History   Socioeconomic History  . Marital status: Married    Spouse name: Not on file  . Number of children: 3   . Years of education: Not on file  . Highest education level: Not on file  Occupational History    Employer: [employer]  Social Needs  . Financial resource strain: Not on file  . Food insecurity:    Worry: Not on file    Inability: Not on file  . Transportation needs:    Medical: Not on file    Non-medical: Not on file  Tobacco Use  . Smoking status: Former Smoker    Types: Cigarettes    Last attempt to quit: 06/27/1988    Years since quitting: 30.2  . Smokeless tobacco: Never Used  Substance and Sexual Activity  . Alcohol use: Yes  . Drug use: No  . Sexual activity: Not on file  Lifestyle  . Physical activity:    Days per week: Not on file     Minutes per session: Not on file  . Stress: Not on file  Relationships  . Social connections:    Talks on phone: Not on file    Gets together: Not on file    Attends religious service: Not on file    Active member of club or organization: Not on file    Attends meetings of clubs or organizations: Not on file    Relationship status: Not on file  Other Topics Concern  . Not on file  Social History Narrative   Started some exercise. Occ caffeine but not daily.    Family History: Family History  Problem Relation Age of Onset  . Breast cancer Mother 58  . Uterine cancer Mother   . Alcohol abuse Maternal Uncle   . Heart attack Maternal Uncle   . Stroke Maternal Uncle   . Lung cancer Maternal Grandfather   . Diabetes Maternal Aunt   . Hypertension Brother   . Hypertension Sister   . Diabetes Sister    Allergies: No Known Allergies Medications: See med rec.  Review of Systems: No fevers, chills, night sweats, weight loss, chest pain, or shortness of breath.   Objective:    General: Well Developed, well nourished, and in no acute distress.  Neuro: Alert and oriented x3, extra-ocular muscles intact, sensation grossly intact.  HEENT: Normocephalic, atraumatic, pupils equal round reactive to light, neck supple, no masses, no lymphadenopathy, thyroid nonpalpable.  Oropharynx, nasopharynx, ear canals unremarkable. Skin: Warm and dry, no rashes. Cardiac: Regular rate and rhythm, no murmurs rubs or gallops, no lower extremity edema.  Respiratory: Clear to auscultation bilaterally. Not using accessory muscles, speaking in full sentences.  Impression and Recommendations:    Shortness of breath Chest x-ray, labs including d-dimer. This is likely just carryover from his influenza weeks ago. Clinically stable, exam is for the most part benign.   ___________________________________________ Gwen Her. Dianah Field, M.D., ABFM., CAQSM. Primary Care and Sports Medicine [place]  [facility]  Adjunct Professor of [employer] [facility] [employer]

## 2018-09-14 NOTE — Telephone Encounter (Signed)
For shortness of breath please have patient schedule an appointment.

## 2018-09-14 NOTE — Telephone Encounter (Signed)
He only has slight shortness of breath. He does not qualify for the drive-up testing. He would need an order if he is to be tested.

## 2018-09-15 LAB — COMPREHENSIVE METABOLIC PANEL
ALT: 20 U/L (ref 9–46)
AST: 18 U/L (ref 10–35)
Albumin: 4.1 g/dL (ref 3.6–5.1)
Alkaline phosphatase (APISO): 70 U/L (ref 35–144)
BUN: 13 mg/dL (ref 7–25)
Calcium: 9.7 mg/dL (ref 8.6–10.3)
Chloride: 105 mmol/L (ref 98–110)
Creat: 0.84 mg/dL (ref 0.70–1.33)
Globulin: 3.3 g/dL (calc) (ref 1.9–3.7)
Glucose, Bld: 134 mg/dL — ABNORMAL HIGH (ref 65–99)
Potassium: 5 mmol/L (ref 3.5–5.3)
Sodium: 140 mmol/L (ref 135–146)
Total Bilirubin: 0.3 mg/dL (ref 0.2–1.2)
Total Protein: 7.4 g/dL (ref 6.1–8.1)

## 2018-09-15 LAB — CBC
HCT: 42.9 % (ref 38.5–50.0)
Hemoglobin: 14.6 g/dL (ref 13.2–17.1)
MCH: 29.1 pg (ref 27.0–33.0)
MCHC: 34 g/dL (ref 32.0–36.0)
MCV: 85.5 fL (ref 80.0–100.0)
MPV: 12.2 fL (ref 7.5–12.5)
Platelets: 223 10*3/uL (ref 140–400)
RBC: 5.02 10*6/uL (ref 4.20–5.80)
RDW: 12.6 % (ref 11.0–15.0)
WBC: 6.6 10*3/uL (ref 3.8–10.8)

## 2018-09-15 LAB — COMPREHENSIVE METABOLIC PANEL WITH GFR
AG Ratio: 1.2 (calc) (ref 1.0–2.5)
CO2: 26 mmol/L (ref 20–32)

## 2018-09-15 LAB — D-DIMER, QUANTITATIVE: D-Dimer, Quant: 0.32 mcg/mL FEU (ref <0.50)

## 2018-09-15 LAB — BRAIN NATRIURETIC PEPTIDE: Brain Natriuretic Peptide: 35 pg/mL (ref <100)

## 2018-10-29 ENCOUNTER — Encounter: Payer: Self-pay | Admitting: Family Medicine

## 2018-10-29 ENCOUNTER — Ambulatory Visit (INDEPENDENT_AMBULATORY_CARE_PROVIDER_SITE_OTHER): Payer: Commercial Managed Care - PPO | Admitting: Family Medicine

## 2018-10-29 VITALS — BP 135/74 | HR 88 | Ht 67.0 in | Wt 293.0 lb

## 2018-10-29 DIAGNOSIS — E119 Type 2 diabetes mellitus without complications: Secondary | ICD-10-CM | POA: Diagnosis not present

## 2018-10-29 LAB — POCT GLYCOSYLATED HEMOGLOBIN (HGB A1C): Hemoglobin A1C: 8.7 % — AB (ref 4.0–5.6)

## 2018-10-29 NOTE — Progress Notes (Signed)
Subjective:    CC: F/U DM   HPI:  Diabetes - no hypoglycemic events. No wounds or sores that are not healing well. No increased thirst or urination. Checking glucose at home. Taking medications as prescribed without any side effects.  He is on a low-dose of metformin but that is all he could really take tolerate.  He has had prior history of multiple episodes of diverticulitis he says it took him a while to get used to the metformin.  He also takes a cinnamon.  He says he is actually been laid off work for the last 5 weeks and was finally back to work today.  He says they have to wear a mask now.  He is felt well as far as he is not had any recent illness or upper respiratory symptoms.  But he admits he has not been exercising over the last 5 weeks and diet has not been quite nearly as strict he has been getting into some ice cream.   Past medical history, Surgical history, Family history not pertinant except as noted below, Social history, Allergies, and medications have been entered into the medical record, reviewed, and corrections made.   Review of Systems: No fevers, chills, night sweats, weight loss, chest pain, or shortness of breath.   Objective:    General: Well Developed, well nourished, and in no acute distress.  Neuro: Alert and oriented x3, extra-ocular muscles intact, sensation grossly intact.  HEENT: Normocephalic, atraumatic  Skin: Warm and dry, no rashes. Cardiac: Regular rate and rhythm, no murmurs rubs or gallops, no lower extremity edema.  Respiratory: Clear to auscultation bilaterally. Not using accessory muscles, speaking in full sentences.   Impression and Recommendations:   Diabetes-uncontrolled.  Hemoglobin A1c 8.7 again today which is actually what it was 3 months ago.  I strongly encouraged him to consider adding a medication to his metformin such as a combination we discussed the different types of medication such as DPP 4 inhibitors and GLP twos etc.  He really  wanted to hold off and was quite adamant about not doing additional medication for at least 3 more months.  He feels like there is some more work that he could really do with his diet and exercise and says he is starting to get back into routine and checking his sugars again regularly.  I really encouraged him to consider making the medication adjustment reminding him that just by adding medication it will not get his A1c down by 2 points that part of that would be his diet and exercise component.  Plan to follow-up in 3 months.  BMI 45-discussed getting back on track with exercise and diet.  I really like to see him get his weight back down to at least 259 pounds which is where he was couple of years ago and his blood pressure looked great at that time. DM -

## 2018-11-14 ENCOUNTER — Other Ambulatory Visit: Payer: Self-pay | Admitting: Family Medicine

## 2018-12-13 ENCOUNTER — Other Ambulatory Visit: Payer: Self-pay | Admitting: *Deleted

## 2018-12-13 DIAGNOSIS — M19072 Primary osteoarthritis, left ankle and foot: Secondary | ICD-10-CM

## 2018-12-13 MED ORDER — CELECOXIB 200 MG PO CAPS: ORAL_CAPSULE | ORAL | 0 refills | Status: DC

## 2018-12-24 ENCOUNTER — Other Ambulatory Visit: Payer: Self-pay | Admitting: Family Medicine

## 2019-01-03 ENCOUNTER — Encounter: Payer: Self-pay | Admitting: Family Medicine

## 2019-01-29 ENCOUNTER — Ambulatory Visit: Payer: Commercial Managed Care - PPO | Admitting: Family Medicine

## 2019-02-04 ENCOUNTER — Ambulatory Visit: Payer: Commercial Managed Care - PPO | Admitting: Family Medicine

## 2019-02-04 ENCOUNTER — Other Ambulatory Visit: Payer: Self-pay

## 2019-02-04 ENCOUNTER — Encounter: Payer: Self-pay | Admitting: Family Medicine

## 2019-02-04 VITALS — BP 124/66 | HR 83 | Ht 67.0 in | Wt 292.0 lb

## 2019-02-04 DIAGNOSIS — E119 Type 2 diabetes mellitus without complications: Secondary | ICD-10-CM

## 2019-02-04 MED ORDER — FARXIGA 5 MG PO TABS: 5.0000 mg | ORAL_TABLET | Freq: Every day | ORAL | 5 refills | Status: DC

## 2019-02-04 MED ORDER — XIGDUO XR 10-500 MG PO TB24: 1.0000 | ORAL_TABLET | Freq: Every day | ORAL | 5 refills | Status: DC

## 2019-02-04 NOTE — Progress Notes (Signed)
Established Patient Office Visit  Subjective:  Patient ID: Frederick Yang, male    DOB: 1960/09/20  Age: 58 y.o. MRN: [id number]  CC:  Chief Complaint  Patient presents with  . Diabetes    HPI Frederick Yang presents for   Diabetes - no hypoglycemic events. No wounds or sores that are not healing well. No increased thirst or urination. Checking glucose at home. Taking medications as prescribed without any side effects.  Still been eating some sweets but overall feels like he eats pretty healthy.  Eye exam is up-to-date he had that done in February.  He says he is taking his medication regularly.   Past Medical History:  Diagnosis Date  . Diabetes mellitus without complication ([place])   . GERD (gastroesophageal reflux disease)   . Hepatic steatosis   . Hyperlipidemia   . Prostate hypertrophy     Past Surgical History:  Procedure Laterality Date  . APPENDECTOMY    . COLON RESECTION SIGMOID  11/2015  . HEMORRHOID BANDING  06/24/13   Dr. Ludwig Lean  . HEMORRHOID BANDING  07/12/2013  . HERNIA REPAIR     RT groin  . right shoulder surgery  03/06/2014   Dr. Tamera Punt   . TREATMENT FISTULA ANAL      Family History  Problem Relation Age of Onset  . Breast cancer Mother 52  . Uterine cancer Mother   . Alcohol abuse Maternal Uncle   . Heart attack Maternal Uncle   . Stroke Maternal Uncle   . Lung cancer Maternal Grandfather   . Diabetes Maternal Aunt   . Hypertension Brother   . Hypertension Sister   . Diabetes Sister     Social History   Socioeconomic History  . Marital status: Married    Spouse name: Not on file  . Number of children: 3   . Years of education: Not on file  . Highest education level: Not on file  Occupational History    Employer: [employer]  Social Needs  . Financial resource strain: Not on file  . Food insecurity    Worry: Not on file    Inability: Not on file  . Transportation needs    Medical: Not on file    Non-medical: Not on file   Tobacco Use  . Smoking status: Former Smoker    Types: Cigarettes    Quit date: 06/27/1988    Years since quitting: 30.6  . Smokeless tobacco: Never Used  Substance and Sexual Activity  . Alcohol use: Yes  . Drug use: No  . Sexual activity: Not on file  Lifestyle  . Physical activity    Days per week: Not on file    Minutes per session: Not on file  . Stress: Not on file  Relationships  . Social Herbalist on phone: Not on file    Gets together: Not on file    Attends religious service: Not on file    Active member of club or organization: Not on file    Attends meetings of clubs or organizations: Not on file    Relationship status: Not on file  . Intimate partner violence    Fear of current or ex partner: Not on file    Emotionally abused: Not on file    Physically abused: Not on file    Forced sexual activity: Not on file  Other Topics Concern  . Not on file  Social History Narrative   Started some exercise.  Occ caffeine but not daily.     Outpatient Medications Prior to Visit  Medication Sig Dispense Refill  . atorvastatin (LIPITOR) 20 MG tablet Take 1 tablet (20 mg total) by mouth at bedtime. 90 tablet 3  . calcium-vitamin D (OSCAL WITH D) 500-200 MG-UNIT TABS tablet Take 1 tablet by mouth every evening.    . celecoxib (CELEBREX) 200 MG capsule 1 TO 2 TABLETS BY MOUTH DAILY AS NEEDED FOR PAIN. 180 capsule 0  . Cinnamon 500 MG TABS Take 1,000 mg by mouth every evening.    . Flaxseed, Linseed, (FLAXSEED OIL) 1000 MG CAPS Take 1 capsule by mouth every evening.    . fluticasone (FLONASE) 50 MCG/ACT nasal spray SPRAY 2 SPRAYS INTO EACH NOSTRIL EVERY DAY 48 g 1  . Garlic 0981 MG CAPS Take 1 capsule by mouth every evening.    Marland Kitchen MAGNESIUM PO Take 1 tablet by mouth daily.    . Misc Natural Products (GLUCOSAMINE CHONDROITIN TRIPLE PO) Take 2 tablets by mouth daily.    . Multiple Vitamin (MULTIVITAMIN) capsule Take 1 capsule by mouth every evening.    . niacin 500 MG  tablet Take 1 tablet by mouth daily.    . Omega-3 Fatty Acids (FISH OIL) 1000 MG CAPS Take 1 capsule by mouth every evening.    . metFORMIN (GLUCOPHAGE-XR) 500 MG 24 hr tablet TAKE 1 TABLET BY MOUTH EVERY DAY WITH BREAKFAST 90 tablet 1   No facility-administered medications prior to visit.     No Known Allergies  ROS Review of Systems    Objective:    Physical Exam  Constitutional: He is oriented to person, place, and time. He appears well-developed and well-nourished.  HENT:  Head: Normocephalic and atraumatic.  Cardiovascular: Normal rate, regular rhythm and normal heart sounds.  Pulmonary/Chest: Effort normal and breath sounds normal.  Neurological: He is alert and oriented to person, place, and time.  Skin: Skin is warm and dry.  Psychiatric: He has a normal mood and affect. His behavior is normal.    BP 124/66   Pulse 83   Ht 5\' 7"  (1.702 m)   Wt 292 lb (132.5 kg)   SpO2 100%   BMI 45.73 kg/m  Wt Readings from Last 3 Encounters:  02/04/19 292 lb (132.5 kg)  10/29/18 293 lb (132.9 kg)  09/14/18 292 lb (132.5 kg)     Health Maintenance Due  Topic Date Due  . COLONOSCOPY  08/09/2018    There are no preventive care reminders to display for this patient.  No results found for: TSH Lab Results  Component Value Date   WBC 6.6 09/14/2018   HGB 14.6 09/14/2018   HCT 42.9 09/14/2018   MCV 85.5 09/14/2018   PLT 223 09/14/2018   Lab Results  Component Value Date   NA 140 09/14/2018   K 5.0 09/14/2018   CO2 26 09/14/2018   GLUCOSE 134 (H) 09/14/2018   BUN 13 09/14/2018   CREATININE 0.84 09/14/2018   BILITOT 0.3 09/14/2018   ALKPHOS 66 02/07/2017   AST 18 09/14/2018   ALT 20 09/14/2018   PROT 7.4 09/14/2018   ALBUMIN 3.9 02/07/2017   CALCIUM 9.7 09/14/2018   ANIONGAP 7 06/06/2015   Lab Results  Component Value Date   CHOL 261 (H) 04/27/2018   Lab Results  Component Value Date   HDL 35 (L) 04/27/2018   Lab Results  Component Value Date    LDLCALC 193 (H) 04/27/2018   Lab Results  Component Value  Date   TRIG 167 (H) 04/27/2018   Lab Results  Component Value Date   CHOLHDL 7.5 (H) 04/27/2018   Lab Results  Component Value Date   HGBA1C 8.7 (A) 10/29/2018      Assessment & Plan:   Problem List Items Addressed This Visit      Endocrine   Diabetes mellitus without complication ([place]) - Primary    A1c elevated at 9.0 today, uncontrolled.  When I last saw him he wanted to hold off on any medication change and just continue work on diet and exercise.  Today he is willing to start a new medication.  We can discontinue metformin and switch him to 6 0.  Hopefully this will lower his A1c by about appointment may not get him completely to therapeutic goal so if not we could consider Qtern.  To work on [employer] and regular exercise.  Continue work on weight loss.      Relevant Medications   XIGDUO XR 10-500 MG TB24      Meds ordered this encounter  Medications  . DISCONTD: dapagliflozin propanediol (FARXIGA) 5 MG TABS tablet    Sig: Take 5 mg by mouth daily. See coupon card.    Dispense:  30 tablet    Refill:  5  . XIGDUO XR 10-500 MG TB24    Sig: Take 1 tablet by mouth daily.    Dispense:  30 tablet    Refill:  5    Follow-up: Return in about 3 months (around 05/07/2019) for Diabetes follow-up.    Beatrice Lecher, MD

## 2019-02-04 NOTE — Assessment & Plan Note (Signed)
A1c elevated at 9.0 today, uncontrolled.  When I last saw him he wanted to hold off on any medication change and just continue work on diet and exercise.  Today he is willing to start a new medication.  We can discontinue metformin and switch him to 6 0.  Hopefully this will lower his A1c by about appointment may not get him completely to therapeutic goal so if not we could consider Qtern.  To work on [employer] and regular exercise.  Continue work on weight loss.

## 2019-02-08 ENCOUNTER — Ambulatory Visit (INDEPENDENT_AMBULATORY_CARE_PROVIDER_SITE_OTHER): Payer: Commercial Managed Care - PPO

## 2019-02-08 ENCOUNTER — Ambulatory Visit (INDEPENDENT_AMBULATORY_CARE_PROVIDER_SITE_OTHER): Payer: Commercial Managed Care - PPO | Admitting: Sports Medicine

## 2019-02-08 ENCOUNTER — Encounter: Payer: Self-pay | Admitting: Sports Medicine

## 2019-02-08 ENCOUNTER — Other Ambulatory Visit: Payer: Self-pay

## 2019-02-08 DIAGNOSIS — M170 Bilateral primary osteoarthritis of knee: Secondary | ICD-10-CM | POA: Diagnosis not present

## 2019-02-08 NOTE — Assessment & Plan Note (Signed)
Left worse than right. Bilateral x-rays, continue Celebrex which has been very effective. He can apply an ice pack to his knees for 20 minutes 3-4 times a day, especially after returning from work. I have asked him to work aggressively on weight loss, he can discuss Ozempic with Dr. Madilyn Fireman, this has very good efficacy for weight loss. I also planted the seed of bariatric surgery, he will think about this as well. Return to see me in 1 month to see how things are going.

## 2019-02-08 NOTE — Progress Notes (Signed)
Subjective:    CC: Bilateral knee pain  HPI: This is a pleasant 58 year old male, he has left worse than right knee pain, medial joint line, moderate gelling, no trauma, no mechanical symptoms, much better with CBD oil and Celebrex.  He is wondering what the next steps are.  Moderate, persistent, localized without radiation.  I reviewed the past medical history, family history, social history, surgical history, and allergies today and no changes were needed.  Please see the problem list section below in epic for further details.  Past Medical History: Past Medical History:  Diagnosis Date  . Diabetes mellitus without complication ([place])   . GERD (gastroesophageal reflux disease)   . Hepatic steatosis   . Hyperlipidemia   . Prostate hypertrophy    Past Surgical History: Past Surgical History:  Procedure Laterality Date  . APPENDECTOMY    . COLON RESECTION SIGMOID  11/2015  . HEMORRHOID BANDING  06/24/13   Dr. Ludwig Lean  . HEMORRHOID BANDING  07/12/2013  . HERNIA REPAIR     RT groin  . right shoulder surgery  03/06/2014   Dr. Tamera Punt   . TREATMENT FISTULA ANAL     Social History: Social History   Socioeconomic History  . Marital status: Married    Spouse name: Not on file  . Number of children: 3   . Years of education: Not on file  . Highest education level: Not on file  Occupational History    Employer: [employer]  Social Needs  . Financial resource strain: Not on file  . Food insecurity    Worry: Not on file    Inability: Not on file  . Transportation needs    Medical: Not on file    Non-medical: Not on file  Tobacco Use  . Smoking status: Former Smoker    Types: Cigarettes    Quit date: 06/27/1988    Years since quitting: 30.6  . Smokeless tobacco: Never Used  Substance and Sexual Activity  . Alcohol use: Yes  . Drug use: No  . Sexual activity: Not on file  Lifestyle  . Physical activity    Days per week: Not on file    Minutes per session: Not on  file  . Stress: Not on file  Relationships  . Social Herbalist on phone: Not on file    Gets together: Not on file    Attends religious service: Not on file    Active member of club or organization: Not on file    Attends meetings of clubs or organizations: Not on file    Relationship status: Not on file  Other Topics Concern  . Not on file  Social History Narrative   Started some exercise. Occ caffeine but not daily.    Family History: Family History  Problem Relation Age of Onset  . Breast cancer Mother 74  . Uterine cancer Mother   . Alcohol abuse Maternal Uncle   . Heart attack Maternal Uncle   . Stroke Maternal Uncle   . Lung cancer Maternal Grandfather   . Diabetes Maternal Aunt   . Hypertension Brother   . Hypertension Sister   . Diabetes Sister    Allergies: No Known Allergies Medications: See med rec.  Review of Systems: No fevers, chills, night sweats, weight loss, chest pain, or shortness of breath.   Objective:    General: Well Developed, well nourished, and in no acute distress.  Neuro: Alert and oriented x3, extra-ocular muscles intact, sensation  grossly intact.  HEENT: Normocephalic, atraumatic, pupils equal round reactive to light, neck supple, no masses, no lymphadenopathy, thyroid nonpalpable.  Skin: Warm and dry, no rashes. Cardiac: Regular rate and rhythm, no murmurs rubs or gallops, no lower extremity edema.  Respiratory: Clear to auscultation bilaterally. Not using accessory muscles, speaking in full sentences. Bilateral knees: Normal to inspection with no erythema or effusion or obvious bony abnormalities. Tender at the medial joint lines. ROM normal in flexion and extension and lower leg rotation. Ligaments with solid consistent endpoints including ACL, PCL, LCL, MCL. Negative Mcmurray's and provocative meniscal tests. Non painful patellar compression. Patellar and quadriceps tendons unremarkable. Hamstring and quadriceps strength  is normal.  Impression and Recommendations:    Primary osteoarthritis of both knees Left worse than right. Bilateral x-rays, continue Celebrex which has been very effective. He can apply an ice pack to his knees for 20 minutes 3-4 times a day, especially after returning from work. I have asked him to work aggressively on weight loss, he can discuss Ozempic with Dr. Madilyn Fireman, this has very good efficacy for weight loss. I also planted the seed of bariatric surgery, he will think about this as well. Return to see me in 1 month to see how things are going.   ___________________________________________ Gwen Her. Dianah Field, M.D., ABFM., CAQSM. Primary Care and Sports Medicine [facility]  Adjunct Professor of [place] of [facility] [employer]

## 2019-03-01 ENCOUNTER — Ambulatory Visit (INDEPENDENT_AMBULATORY_CARE_PROVIDER_SITE_OTHER): Payer: Commercial Managed Care - PPO | Admitting: Sports Medicine

## 2019-03-01 ENCOUNTER — Other Ambulatory Visit: Payer: Self-pay

## 2019-03-01 ENCOUNTER — Encounter: Payer: Self-pay | Admitting: Sports Medicine

## 2019-03-01 DIAGNOSIS — M170 Bilateral primary osteoarthritis of knee: Secondary | ICD-10-CM

## 2019-03-01 NOTE — Progress Notes (Signed)
Subjective:    CC: Follow-up  HPI: Bilateral knee osteoarthritis: Symptoms now resolved.  I reviewed the past medical history, family history, social history, surgical history, and allergies today and no changes were needed.  Please see the problem list section below in epic for further details.  Past Medical History: Past Medical History:  Diagnosis Date  . Diabetes mellitus without complication ([place])   . GERD (gastroesophageal reflux disease)   . Hepatic steatosis   . Hyperlipidemia   . Prostate hypertrophy    Past Surgical History: Past Surgical History:  Procedure Laterality Date  . APPENDECTOMY    . COLON RESECTION SIGMOID  11/2015  . HEMORRHOID BANDING  06/24/13   Dr. Ludwig Lean  . HEMORRHOID BANDING  07/12/2013  . HERNIA REPAIR     RT groin  . right shoulder surgery  03/06/2014   Dr. Tamera Punt   . TREATMENT FISTULA ANAL     Social History: Social History   Socioeconomic History  . Marital status: Married    Spouse name: Not on file  . Number of children: 3   . Years of education: Not on file  . Highest education level: Not on file  Occupational History    Employer: [employer]  Social Needs  . Financial resource strain: Not on file  . Food insecurity    Worry: Not on file    Inability: Not on file  . Transportation needs    Medical: Not on file    Non-medical: Not on file  Tobacco Use  . Smoking status: Former Smoker    Types: Cigarettes    Quit date: 06/27/1988    Years since quitting: 30.6  . Smokeless tobacco: Never Used  Substance and Sexual Activity  . Alcohol use: Yes  . Drug use: No  . Sexual activity: Not on file  Lifestyle  . Physical activity    Days per week: Not on file    Minutes per session: Not on file  . Stress: Not on file  Relationships  . Social Herbalist on phone: Not on file    Gets together: Not on file    Attends religious service: Not on file    Active member of club or organization: Not on file   Attends meetings of clubs or organizations: Not on file    Relationship status: Not on file  Other Topics Concern  . Not on file  Social History Narrative   Started some exercise. Occ caffeine but not daily.    Family History: Family History  Problem Relation Age of Onset  . Breast cancer Mother 9  . Uterine cancer Mother   . Alcohol abuse Maternal Uncle   . Heart attack Maternal Uncle   . Stroke Maternal Uncle   . Lung cancer Maternal Grandfather   . Diabetes Maternal Aunt   . Hypertension Brother   . Hypertension Sister   . Diabetes Sister    Allergies: No Known Allergies Medications: See med rec.  Review of Systems: No fevers, chills, night sweats, weight loss, chest pain, or shortness of breath.   Objective:    General: Well Developed, well nourished, and in no acute distress.  Neuro: Alert and oriented x3, extra-ocular muscles intact, sensation grossly intact.  HEENT: Normocephalic, atraumatic, pupils equal round reactive to light, neck supple, no masses, no lymphadenopathy, thyroid nonpalpable.  Skin: Warm and dry, no rashes. Cardiac: Regular rate and rhythm, no murmurs rubs or gallops, no lower extremity edema.  Respiratory: Clear  to auscultation bilaterally. Not using accessory muscles, speaking in full sentences.   Impression and Recommendations:    Primary osteoarthritis of both knees Pain is gone now, x-rays confirmed osteoarthritis, Celebrex has been effective, continue icing, rehab exercises, continue to work on aggressive weight loss with Dr. Madilyn Fireman. Anticipatory guidance given regarding avoiding deep knee flexion and twisting. Return to see me as needed.   ___________________________________________ Gwen Her. Dianah Field, M.D., ABFM., CAQSM. Primary Care and Sports Medicine  [facility]  Adjunct Professor of [place] of [facility] of Medicine

## 2019-03-01 NOTE — Assessment & Plan Note (Signed)
Pain is gone now, x-rays confirmed osteoarthritis, Celebrex has been effective, continue icing, rehab exercises, continue to work on aggressive weight loss with Dr. Madilyn Fireman. Anticipatory guidance given regarding avoiding deep knee flexion and twisting. Return to see me as needed.

## 2019-04-22 ENCOUNTER — Encounter: Payer: Self-pay | Admitting: Family Medicine

## 2019-04-22 ENCOUNTER — Ambulatory Visit (INDEPENDENT_AMBULATORY_CARE_PROVIDER_SITE_OTHER): Payer: Commercial Managed Care - PPO | Admitting: Family Medicine

## 2019-04-22 VITALS — BP 102/66 | HR 93 | Temp 97.0°F | Resp 18 | Ht 67.0 in

## 2019-04-22 DIAGNOSIS — R6881 Early satiety: Secondary | ICD-10-CM

## 2019-04-22 DIAGNOSIS — R8290 Unspecified abnormal findings in urine: Secondary | ICD-10-CM | POA: Diagnosis not present

## 2019-04-22 DIAGNOSIS — R1906 Epigastric swelling, mass or lump: Secondary | ICD-10-CM | POA: Diagnosis not present

## 2019-04-22 NOTE — Progress Notes (Signed)
He reports that for the past week he feels that he gets pressure and discomfort in his esophagus. Denies any problems w/swallowing.      The nausea is off and on. More so for the past 1.5 days. He has been taking tums for this.

## 2019-04-22 NOTE — Progress Notes (Signed)
Virtual Visit via Video Note  I connected with [name] on 04/22/19 at  2:40 PM EDT by a video enabled telemedicine application and verified that I am speaking with the correct person using two identifiers.   I discussed the limitations of evaluation and management by telemedicine and the availability of in person appointments. The patient expressed understanding and agreed to proceed.  .  Acute Office Visit  Subjective:    Patient ID: Frederick Yang, male    DOB: 1960/08/23, 58 y.o.   MRN: [id number]  Chief Complaint  Patient presents with  . Back Pain  . abdominal fullness    HPI Patient is in today for fullness when he starts to eat.  He feels a pressure in his esophagus and upper abdominal area.  He says it just feels most like it is full.  Almost a little bit of a bloated sensation is not painful he has not had any burning.  No significant nausea.  No vomiting.  He says it feels different than when he has heartburn or reflux.  He says he knows what that feels like because it usually happens when he eats tomato products.  He denies any recent dietary changes.  Has had prior history of colon resection and appendectomy.  He still has his gallbladder.  His urine has had a bad odor x 2 weeks. Has had some low back pain as well during the same time frame.  Though pain is mostly over the left lateral area just above the hip.  Blood in the urine or burning with urination.  He just notices a little bit of discomfort when he leans to the side or when he lays in a certain position.  He also wanted to let me know that he has a dental infection going on in fact he is going to pick up a antibiotic today it is affecting his left lower jaw.  Some facial swelling over the weekend.  Past Medical History:  Diagnosis Date  . Diabetes mellitus without complication ([place])   . GERD (gastroesophageal reflux disease)   . Hepatic steatosis   . Hyperlipidemia   . Prostate hypertrophy     Past  Surgical History:  Procedure Laterality Date  . APPENDECTOMY    . COLON RESECTION SIGMOID  11/2015  . HEMORRHOID BANDING  06/24/13   Dr. Ludwig Lean  . HEMORRHOID BANDING  07/12/2013  . HERNIA REPAIR     RT groin  . right shoulder surgery  03/06/2014   Dr. Tamera Punt   . TREATMENT FISTULA ANAL      Family History  Problem Relation Age of Onset  . Breast cancer Mother 27  . Uterine cancer Mother   . Alcohol abuse Maternal Uncle   . Heart attack Maternal Uncle   . Stroke Maternal Uncle   . Lung cancer Maternal Grandfather   . Diabetes Maternal Aunt   . Hypertension Brother   . Hypertension Sister   . Diabetes Sister     Social History   Socioeconomic History  . Marital status: Married    Spouse name: Not on file  . Number of children: 3   . Years of education: Not on file  . Highest education level: Not on file  Occupational History    Employer: [employer]  Social Needs  . Financial resource strain: Not on file  . Food insecurity    Worry: Not on file    Inability: Not on file  . Transportation needs  Medical: Not on file    Non-medical: Not on file  Tobacco Use  . Smoking status: Former Smoker    Types: Cigarettes    Quit date: 06/27/1988    Years since quitting: 30.8  . Smokeless tobacco: Never Used  Substance and Sexual Activity  . Alcohol use: Yes  . Drug use: No  . Sexual activity: Not on file  Lifestyle  . Physical activity    Days per week: Not on file    Minutes per session: Not on file  . Stress: Not on file  Relationships  . Social Herbalist on phone: Not on file    Gets together: Not on file    Attends religious service: Not on file    Active member of club or organization: Not on file    Attends meetings of clubs or organizations: Not on file    Relationship status: Not on file  . Intimate partner violence    Fear of current or ex partner: Not on file    Emotionally abused: Not on file    Physically abused: Not on file     Forced sexual activity: Not on file  Other Topics Concern  . Not on file  Social History Narrative   Started some exercise. Occ caffeine but not daily.     Outpatient Medications Prior to Visit  Medication Sig Dispense Refill  . atorvastatin (LIPITOR) 20 MG tablet Take 1 tablet (20 mg total) by mouth at bedtime. 90 tablet 3  . calcium-vitamin D (OSCAL WITH D) 500-200 MG-UNIT TABS tablet Take 1 tablet by mouth every evening.    . celecoxib (CELEBREX) 200 MG capsule 1 TO 2 TABLETS BY MOUTH DAILY AS NEEDED FOR PAIN. 180 capsule 0  . Cinnamon 500 MG TABS Take 1,000 mg by mouth every evening.    . Flaxseed, Linseed, (FLAXSEED OIL) 1000 MG CAPS Take 1 capsule by mouth every evening.    . fluticasone (FLONASE) 50 MCG/ACT nasal spray SPRAY 2 SPRAYS INTO EACH NOSTRIL EVERY DAY 48 g 1  . Garlic 0000000 MG CAPS Take 1 capsule by mouth every evening.    Marland Kitchen MAGNESIUM PO Take 1 tablet by mouth daily.    . Misc Natural Products (GLUCOSAMINE CHONDROITIN TRIPLE PO) Take 2 tablets by mouth daily.    . Multiple Vitamin (MULTIVITAMIN) capsule Take 1 capsule by mouth every evening.    . niacin 500 MG tablet Take 1 tablet by mouth daily.    . Omega-3 Fatty Acids (FISH OIL) 1000 MG CAPS Take 1 capsule by mouth every evening.    Marland Kitchen XIGDUO XR 10-500 MG TB24 Take 1 tablet by mouth daily. 30 tablet 5   No facility-administered medications prior to visit.     No Known Allergies  ROS     Objective:    Physical Exam  Constitutional: He is oriented to person, place, and time. He appears well-developed and well-nourished.  HENT:  Head: Normocephalic and atraumatic.  Eyes: Conjunctivae and EOM are normal.  Pulmonary/Chest: Effort normal.  Neurological: He is alert and oriented to person, place, and time.  Skin: Skin is dry. No pallor.  Psychiatric: He has a normal mood and affect. His behavior is normal.  Vitals reviewed.   BP 102/66   Pulse 93   Temp (!) 97 F (36.1 C)   Resp 18   Ht 5\' 7"  (1.702 m)    SpO2 98%   BMI 44.64 kg/m  Wt Readings from Last 3 Encounters:  03/01/19 285 lb (129.3 kg)  02/08/19 286 lb (129.7 kg)  02/04/19 292 lb (132.5 kg)    Health Maintenance Due  Topic Date Due  . COLONOSCOPY  08/09/2018  . URINE MICROALBUMIN  04/27/2019    There are no preventive care reminders to display for this patient.   No results found for: TSH Lab Results  Component Value Date   WBC 6.6 09/14/2018   HGB 14.6 09/14/2018   HCT 42.9 09/14/2018   MCV 85.5 09/14/2018   PLT 223 09/14/2018   Lab Results  Component Value Date   NA 140 09/14/2018   K 5.0 09/14/2018   CO2 26 09/14/2018   GLUCOSE 134 (H) 09/14/2018   BUN 13 09/14/2018   CREATININE 0.84 09/14/2018   BILITOT 0.3 09/14/2018   ALKPHOS 66 02/07/2017   AST 18 09/14/2018   ALT 20 09/14/2018   PROT 7.4 09/14/2018   ALBUMIN 3.9 02/07/2017   CALCIUM 9.7 09/14/2018   ANIONGAP 7 06/06/2015   Lab Results  Component Value Date   CHOL 261 (H) 04/27/2018   Lab Results  Component Value Date   HDL 35 (L) 04/27/2018   Lab Results  Component Value Date   LDLCALC 193 (H) 04/27/2018   Lab Results  Component Value Date   TRIG 167 (H) 04/27/2018   Lab Results  Component Value Date   CHOLHDL 7.5 (H) 04/27/2018   Lab Results  Component Value Date   HGBA1C 8.7 (A) 10/29/2018       Assessment & Plan:   Problem List Items Addressed This Visit    None    Visit Diagnoses    Bad odor of urine    -  Primary   Relevant Orders   Urinalysis, Routine w reflex microscopic   Urine Culture   Epigastric fullness       Relevant Orders   Ambulatory referral to Gastroenterology   Early satiety       Relevant Orders   Ambulatory referral to Gastroenterology     Bad urine odor x2 weeks-we will get urinalysis and culture for further work-up.  Constipation-discussed that this could actually be causing some of his left side pain in addition to the early satiety that he has been experiencing.  Epigastric  fullness-and/early satiety-unclear etiology is not really having a lot of discomfort or pain.  It could be related to his constipation so we did discuss increasing his fiber and maybe even using a laxative if needed.  He says normally just taking fiber works well for him.  We did discuss maybe even getting a CBC but right now he is also dealing with a dental infection so that may sort of falsely elevate his white blood cell count.  If getting his bowels moving does not help then please let me know.  I can also refer him to digestive health specialist.  He says he is actually overdue for his follow-up with them.  He normally sees Dr. Truman Hayward.  No orders of the defined types were placed in this encounter.    I discussed the assessment and treatment plan with the patient. The patient was provided an opportunity to ask questions and all were answered. The patient agreed with the plan and demonstrated an understanding of the instructions.   The patient was advised to call back or seek an in-person evaluation if the symptoms worsen or if the condition fails to improve as anticipated  Beatrice Lecher, MD

## 2019-04-24 DIAGNOSIS — R1319 Other dysphagia: Secondary | ICD-10-CM | POA: Insufficient documentation

## 2019-04-24 LAB — URINALYSIS, ROUTINE W REFLEX MICROSCOPIC
Bilirubin Urine: NEGATIVE
Hgb urine dipstick: NEGATIVE
Leukocytes,Ua: NEGATIVE
Nitrite: NEGATIVE
Protein, ur: NEGATIVE
Specific Gravity, Urine: 1.041 — ABNORMAL HIGH (ref 1.001–1.03)
pH: <=5 (ref 5.0–8.0)

## 2019-04-24 LAB — URINE CULTURE
MICRO NUMBER:: 1030667
Result:: NO GROWTH
SPECIMEN QUALITY:: ADEQUATE

## 2019-05-07 ENCOUNTER — Ambulatory Visit (INDEPENDENT_AMBULATORY_CARE_PROVIDER_SITE_OTHER): Payer: Commercial Managed Care - PPO | Admitting: Family Medicine

## 2019-05-07 ENCOUNTER — Other Ambulatory Visit: Payer: Self-pay

## 2019-05-07 ENCOUNTER — Encounter: Payer: Self-pay | Admitting: Family Medicine

## 2019-05-07 VITALS — BP 93/56 | HR 95 | Ht 67.0 in | Wt 284.0 lb

## 2019-05-07 DIAGNOSIS — R6881 Early satiety: Secondary | ICD-10-CM | POA: Diagnosis not present

## 2019-05-07 DIAGNOSIS — Z125 Encounter for screening for malignant neoplasm of prostate: Secondary | ICD-10-CM

## 2019-05-07 DIAGNOSIS — E119 Type 2 diabetes mellitus without complications: Secondary | ICD-10-CM

## 2019-05-07 LAB — POCT UA - MICROALBUMIN
Creatinine, POC: 100 mg/dL
Microalbumin Ur, POC: 30 mg/L

## 2019-05-07 LAB — POCT GLYCOSYLATED HEMOGLOBIN (HGB A1C): Hemoglobin A1C: 7.4 % — AB (ref 4.0–5.6)

## 2019-05-07 NOTE — Assessment & Plan Note (Addendum)
A1C improved today at 7.4.  Improved.  It looks better.  Continue with the Xigduo and cinnamon capsules.  He says he knows he needs to get back into his regular workout routine and feels like he could do better with his diet so for now we will just continue with current regimen and have him follow-up in 3 to 4 months.  Due for urine microalbumin.

## 2019-05-07 NOTE — Progress Notes (Signed)
Established Patient Office Visit  Subjective:  Patient ID: Frederick Yang, male    DOB: 1961/04/16  Age: 58 y.o. MRN: [id number]  CC:  Chief Complaint  Patient presents with  . Diabetes    HPI Frederick Yang presents for   Diabetes - no hypoglycemic events. No wounds or sores that are not healing well. No increased thirst or urination. Checking glucose at home. Taking medications as prescribed without any side effects.  Been taking his medication regularly for had any side effects or problems.  He has not started exercising again yet and says he does have some room to improve his diet.  Fasting blood sugars in the morning have been running 130s to 150s.  And postprandials about 3 hours after eating in the afternoon have been running between 110 and 115.  He was here about a week ago for early satiety, constipation and epigastric pain.  He did increase his fiber and get his bowel movements and said the epigastric pain actually seems a little bit better but he was still having the fullness sensation when he would eat he did see Dr. Loura Halt, his gastroenterologist and he is actually scheduled for endoscopy tomorrow morning.  Did get his flu vaccine done at work.  Past Medical History:  Diagnosis Date  . Diabetes mellitus without complication ([place])   . GERD (gastroesophageal reflux disease)   . Hepatic steatosis   . Hyperlipidemia   . Prostate hypertrophy     Past Surgical History:  Procedure Laterality Date  . APPENDECTOMY    . COLON RESECTION SIGMOID  11/2015  . HEMORRHOID BANDING  06/24/13   Dr. Ludwig Lean  . HEMORRHOID BANDING  07/12/2013  . HERNIA REPAIR     RT groin  . right shoulder surgery  03/06/2014   Dr. Tamera Punt   . TREATMENT FISTULA ANAL      Family History  Problem Relation Age of Onset  . Breast cancer Mother 65  . Uterine cancer Mother   . Alcohol abuse Maternal Uncle   . Heart attack Maternal Uncle   . Stroke Maternal Uncle   . Lung cancer  Maternal Grandfather   . Diabetes Maternal Aunt   . Hypertension Brother   . Hypertension Sister   . Diabetes Sister     Social History   Socioeconomic History  . Marital status: Married    Spouse name: Not on file  . Number of children: 3   . Years of education: Not on file  . Highest education level: Not on file  Occupational History    Employer: [employer]  Social Needs  . Financial resource strain: Not on file  . Food insecurity    Worry: Not on file    Inability: Not on file  . Transportation needs    Medical: Not on file    Non-medical: Not on file  Tobacco Use  . Smoking status: Former Smoker    Types: Cigarettes    Quit date: 06/27/1988    Years since quitting: 30.8  . Smokeless tobacco: Never Used  Substance and Sexual Activity  . Alcohol use: Yes  . Drug use: No  . Sexual activity: Not on file  Lifestyle  . Physical activity    Days per week: Not on file    Minutes per session: Not on file  . Stress: Not on file  Relationships  . Social Herbalist on phone: Not on file    Gets together: Not  on file    Attends religious service: Not on file    Active member of club or organization: Not on file    Attends meetings of clubs or organizations: Not on file    Relationship status: Not on file  . Intimate partner violence    Fear of current or ex partner: Not on file    Emotionally abused: Not on file    Physically abused: Not on file    Forced sexual activity: Not on file  Other Topics Concern  . Not on file  Social History Narrative   Started some exercise. Occ caffeine but not daily.     Outpatient Medications Prior to Visit  Medication Sig Dispense Refill  . pantoprazole (PROTONIX) 40 MG tablet Take 40 mg by mouth every morning.    Marland Kitchen atorvastatin (LIPITOR) 20 MG tablet Take 1 tablet (20 mg total) by mouth at bedtime. 90 tablet 3  . calcium-vitamin D (OSCAL WITH D) 500-200 MG-UNIT TABS tablet Take 1 tablet by mouth every evening.    .  celecoxib (CELEBREX) 200 MG capsule 1 TO 2 TABLETS BY MOUTH DAILY AS NEEDED FOR PAIN. 180 capsule 0  . Cinnamon 500 MG TABS Take 1,000 mg by mouth every evening.    . Flaxseed, Linseed, (FLAXSEED OIL) 1000 MG CAPS Take 1 capsule by mouth every evening.    . fluticasone (FLONASE) 50 MCG/ACT nasal spray SPRAY 2 SPRAYS INTO EACH NOSTRIL EVERY DAY 48 g 1  . Garlic 0000000 MG CAPS Take 1 capsule by mouth every evening.    Marland Kitchen MAGNESIUM PO Take 1 tablet by mouth daily.    . Misc Natural Products (GLUCOSAMINE CHONDROITIN TRIPLE PO) Take 2 tablets by mouth daily.    . Multiple Vitamin (MULTIVITAMIN) capsule Take 1 capsule by mouth every evening.    . niacin 500 MG tablet Take 1 tablet by mouth daily.    . Omega-3 Fatty Acids (FISH OIL) 1000 MG CAPS Take 1 capsule by mouth every evening.    Marland Kitchen XIGDUO XR 10-500 MG TB24 Take 1 tablet by mouth daily. 30 tablet 5   No facility-administered medications prior to visit.     No Known Allergies  ROS Review of Systems    Objective:    Physical Exam  Constitutional: He is oriented to person, place, and time. He appears well-developed and well-nourished.  HENT:  Head: Normocephalic and atraumatic.  Cardiovascular: Normal rate, regular rhythm and normal heart sounds.  Pulmonary/Chest: Effort normal and breath sounds normal.  Neurological: He is alert and oriented to person, place, and time.  Skin: Skin is warm and dry.  Psychiatric: He has a normal mood and affect. His behavior is normal.    BP (!) 93/56   Pulse 95   Ht 5\' 7"  (1.702 m)   Wt 284 lb (128.8 kg)   SpO2 99%   BMI 44.48 kg/m  Wt Readings from Last 3 Encounters:  05/07/19 284 lb (128.8 kg)  03/01/19 285 lb (129.3 kg)  02/08/19 286 lb (129.7 kg)     Health Maintenance Due  Topic Date Due  . COLONOSCOPY  08/09/2018  . URINE MICROALBUMIN  04/27/2019    There are no preventive care reminders to display for this patient.  No results found for: TSH Lab Results  Component Value Date    WBC 6.6 09/14/2018   HGB 14.6 09/14/2018   HCT 42.9 09/14/2018   MCV 85.5 09/14/2018   PLT 223 09/14/2018   Lab Results  Component Value  Date   NA 140 09/14/2018   K 5.0 09/14/2018   CO2 26 09/14/2018   GLUCOSE 134 (H) 09/14/2018   BUN 13 09/14/2018   CREATININE 0.84 09/14/2018   BILITOT 0.3 09/14/2018   ALKPHOS 66 02/07/2017   AST 18 09/14/2018   ALT 20 09/14/2018   PROT 7.4 09/14/2018   ALBUMIN 3.9 02/07/2017   CALCIUM 9.7 09/14/2018   ANIONGAP 7 06/06/2015   Lab Results  Component Value Date   CHOL 261 (H) 04/27/2018   Lab Results  Component Value Date   HDL 35 (L) 04/27/2018   Lab Results  Component Value Date   LDLCALC 193 (H) 04/27/2018   Lab Results  Component Value Date   TRIG 167 (H) 04/27/2018   Lab Results  Component Value Date   CHOLHDL 7.5 (H) 04/27/2018   Lab Results  Component Value Date   HGBA1C 7.4 (A) 05/07/2019      Assessment & Plan:   Problem List Items Addressed This Visit      Endocrine   Diabetes mellitus without complication ([place]) - Primary    A1C improved today at 7.4.  Improved.  It looks better.  Continue with the Xigduo and cinnamon capsules.  He says he knows he needs to get back into his regular workout routine and feels like he could do better with his diet so for now we will just continue with current regimen and have him follow-up in 3 to 4 months.  Due for urine microalbumin.      Relevant Orders   POCT HgB A1C (Completed)   CBC   COMPLETE METABOLIC PANEL WITH GFR   Lipid panel   PSA   POCT UA - Microalbumin (Completed)    Other Visit Diagnoses    Screening for prostate cancer       Relevant Orders   PSA   Early satiety          Early satiety-scheduled for endoscopy with Dr. Marzetta Board tomorrow.  Due for PSA screening.  No orders of the defined types were placed in this encounter.   Follow-up: Return in about 4 months (around 08/26/2019) for Wellness Exam.    Beatrice Lecher, MD

## 2019-05-09 LAB — CBC
HCT: 49.7 % (ref 38.5–50.0)
Hemoglobin: 16 g/dL (ref 13.2–17.1)
MCH: 27.8 pg (ref 27.0–33.0)
MCHC: 32.2 g/dL (ref 32.0–36.0)
MCV: 86.4 fL (ref 80.0–100.0)
MPV: 12.5 fL (ref 7.5–12.5)
Platelets: 191 10*3/uL (ref 140–400)
RBC: 5.75 10*6/uL (ref 4.20–5.80)
RDW: 12.2 % (ref 11.0–15.0)
WBC: 5.1 10*3/uL (ref 3.8–10.8)

## 2019-05-09 LAB — COMPLETE METABOLIC PANEL WITH GFR
AG Ratio: 1.4 (calc) (ref 1.0–2.5)
ALT: 10 U/L (ref 9–46)
AST: 10 U/L (ref 10–35)
Albumin: 4 g/dL (ref 3.6–5.1)
Alkaline phosphatase (APISO): 72 U/L (ref 35–144)
BUN: 12 mg/dL (ref 7–25)
CO2: 25 mmol/L (ref 20–32)
Calcium: 9.6 mg/dL (ref 8.6–10.3)
Chloride: 103 mmol/L (ref 98–110)
Creat: 0.85 mg/dL (ref 0.70–1.33)
GFR, Est African American: 114 mL/min/{1.73_m2} (ref >=60)
GFR, Est Non African American: 98 mL/min/{1.73_m2} (ref >=60)
Globulin: 2.8 g/dL (calc) (ref 1.9–3.7)
Glucose, Bld: 135 mg/dL — ABNORMAL HIGH (ref 65–99)
Potassium: 5.5 mmol/L — ABNORMAL HIGH (ref 3.5–5.3)
Sodium: 137 mmol/L (ref 135–146)
Total Bilirubin: 0.7 mg/dL (ref 0.2–1.2)
Total Protein: 6.8 g/dL (ref 6.1–8.1)

## 2019-05-09 LAB — LIPID PANEL
Cholesterol: 208 mg/dL — ABNORMAL HIGH (ref <200)
HDL: 35 mg/dL — ABNORMAL LOW (ref >=40)
LDL Cholesterol (Calc): 142 mg/dL (calc) — ABNORMAL HIGH
Non-HDL Cholesterol (Calc): 173 mg/dL (calc) — ABNORMAL HIGH (ref <130)
Total CHOL/HDL Ratio: 5.9 (calc) — ABNORMAL HIGH (ref <5.0)
Triglycerides: 176 mg/dL — ABNORMAL HIGH (ref <150)

## 2019-05-09 LAB — PSA: PSA: 4.1 ng/mL — ABNORMAL HIGH (ref <=4.0)

## 2019-05-10 ENCOUNTER — Other Ambulatory Visit: Payer: Self-pay | Admitting: *Deleted

## 2019-05-10 DIAGNOSIS — E875 Hyperkalemia: Secondary | ICD-10-CM

## 2019-05-11 ENCOUNTER — Other Ambulatory Visit: Payer: Self-pay | Admitting: Family Medicine

## 2019-05-30 ENCOUNTER — Encounter: Payer: Self-pay | Admitting: Family Medicine

## 2019-05-30 ENCOUNTER — Ambulatory Visit (INDEPENDENT_AMBULATORY_CARE_PROVIDER_SITE_OTHER): Payer: Commercial Managed Care - PPO | Admitting: Family Medicine

## 2019-05-30 DIAGNOSIS — R110 Nausea: Secondary | ICD-10-CM | POA: Diagnosis not present

## 2019-05-30 DIAGNOSIS — R0681 Apnea, not elsewhere classified: Secondary | ICD-10-CM | POA: Diagnosis not present

## 2019-05-30 DIAGNOSIS — R0602 Shortness of breath: Secondary | ICD-10-CM

## 2019-05-30 DIAGNOSIS — R0683 Snoring: Secondary | ICD-10-CM | POA: Diagnosis not present

## 2019-05-30 MED ORDER — DEXILANT 30 MG PO CPDR: 30.0000 mg | DELAYED_RELEASE_CAPSULE | Freq: Every day | ORAL | 1 refills | Status: DC

## 2019-05-30 MED ORDER — ONDANSETRON 4 MG PO TBDP: 4.0000 mg | ORAL_TABLET | Freq: Three times a day (TID) | ORAL | 0 refills | Status: DC | PRN

## 2019-05-30 NOTE — Progress Notes (Signed)
Pt reports that his sxs began on Saturday evening. Stated that he just hasn't been feeling well. He has felt nauseated on/off sometimes after eating or when he wakes up suddenly from sleeping.  He said that he has been waking up as if he is having a panic attack. He finds himself gasping for air. O2% has been between 96-100.  Denies any f/s/c/vomiting. He has had slight headache that comes and goes.   He had some diarrhea yesterday and today but also stated that he had been taking miralax.  He was seen by GI recently and had an endoscopy done and said that he sometimes still has issues with eating/drinking as if his throat is closing. They gave him Protonix for the ulcer that was seen on exam..Denyla Cortese, [place] Crocker, CMA

## 2019-05-30 NOTE — Progress Notes (Signed)
Virtual Visit via Video Note  I connected with [name] on 05/31/19 at  4:20 PM EST by a video enabled telemedicine application and verified that I am speaking with the correct person using two identifiers.   I discussed the limitations of evaluation and management by telemedicine and the availability of in person appointments. The patient expressed understanding and agreed to proceed.  Subjective:    CC: Nausea  HPI: Pt reports that his sxs began on Saturday evening. Stated that he just hasn't been feeling well. He has felt nauseated on/off sometimes after eating or when he wakes up suddenly from sleeping. Still have gallbladder.   He said that he has been waking up as if he is having a panic attack. He finds himself gasping for air. O2% has been between 96-100.  Denies any f/s/c/vomiting. He has had slight headache that comes and goes.   He had some diarrhea yesterday and today but also stated that he had been taking miralax.  Follows with Dr. Cindee Salt from GI.  Just had upper endoscopy on 05/08/2019 showing ulcerations in the antrum.  Biopsies were obtained for H pylori.  Dilatation performed as well.  Still on pantoprazole 40mg  daily.  F/U in 2 months.      He snore but no AM Headaches.   Past medical history, Surgical history, Family history not pertinant except as noted below, Social history, Allergies, and medications have been entered into the medical record, reviewed, and corrections made.   Review of Systems: No fevers, chills, night sweats, weight loss, chest pain, or shortness of breath.   Objective:    General: Speaking clearly in complete sentences without any shortness of breath.  Alert and oriented x3.  Normal judgment. No apparent acute distress.    Impression and Recommendations:   Nausea-unclear etiology at this point but he does have an antrum ulcer currently but is already on PPI therapy.  So again to switch him to [place] hopefully his insurance will  cover it.  I am also can send over her prescription for Zofran to take as needed and will see if through the weekend he is actually feeling a little bit better.  Encouraged him to make sure he continues to stick with a bland diet.  Avoid NSAIDs which she is already doing.  Waking up feeling short of breath-it sounds like this is likely from anxiety.  But upon further questioning he does snore.  He does have a BMI greater than 40.  He does not have morning headaches.  I did ask him to get his wife to measure the circumference of his neck and let us know so that we can complete a stop bang questionnaire for him.  I think he would be a good candidate to get evaluated for sleep apnea.  But I do feel like there is some component of anxiety to this as he did not have this problem until Saturday and there is no other signs of respiratory symptoms such as URI or swelling or volume overload.    I discussed the assessment and treatment plan with the patient. The patient was provided an opportunity to ask questions and all were answered. The patient agreed with the plan and demonstrated an understanding of the instructions.   The patient was advised to call back or seek an in-person evaluation if the symptoms worsen or if the condition fails to improve as anticipated.   Beatrice Lecher, MD

## 2019-05-31 ENCOUNTER — Encounter: Payer: Self-pay | Admitting: Family Medicine

## 2019-05-31 ENCOUNTER — Ambulatory Visit: Payer: Commercial Managed Care - PPO | Admitting: Physician Assistant

## 2019-05-31 NOTE — Progress Notes (Signed)
Patient is not sure if her wants to take the [place] or the Zofran at this time. He will think about it and if he decides to take it he will go to the pharmacy. He reports having some "feeling of food backing up after a stretching of his esophagus" I advised him to follow up with their office since they did the procedure.  He did not have any other questions.

## 2019-06-06 MED ORDER — ONDANSETRON 4 MG PO TBDP: 4.0000 mg | ORAL_TABLET | Freq: Three times a day (TID) | ORAL | 1 refills | Status: DC | PRN

## 2019-06-06 NOTE — Addendum Note (Signed)
Addended by: Dessie Coma on: 06/06/2019 02:41 PM   Modules accepted: Orders

## 2019-06-25 ENCOUNTER — Other Ambulatory Visit: Payer: Self-pay

## 2019-06-25 ENCOUNTER — Telehealth (INDEPENDENT_AMBULATORY_CARE_PROVIDER_SITE_OTHER): Payer: Commercial Managed Care - PPO | Admitting: Family Medicine

## 2019-06-25 ENCOUNTER — Ambulatory Visit (INDEPENDENT_AMBULATORY_CARE_PROVIDER_SITE_OTHER): Payer: Commercial Managed Care - PPO

## 2019-06-25 ENCOUNTER — Encounter: Payer: Self-pay | Admitting: Family Medicine

## 2019-06-25 ENCOUNTER — Telehealth: Payer: Commercial Managed Care - PPO | Admitting: Family Medicine

## 2019-06-25 DIAGNOSIS — R110 Nausea: Secondary | ICD-10-CM | POA: Diagnosis not present

## 2019-06-25 DIAGNOSIS — R1012 Left upper quadrant pain: Secondary | ICD-10-CM

## 2019-06-25 DIAGNOSIS — K869 Disease of pancreas, unspecified: Secondary | ICD-10-CM

## 2019-06-25 MED ORDER — ONDANSETRON 4 MG PO TBDP: 4.0000 mg | ORAL_TABLET | Freq: Three times a day (TID) | ORAL | 1 refills | Status: DC | PRN

## 2019-06-25 MED ORDER — SUCRALFATE 1 G PO TABS: 1.0000 g | ORAL_TABLET | Freq: Three times a day (TID) | ORAL | 1 refills | Status: DC

## 2019-06-25 NOTE — Progress Notes (Signed)
Pt reports that he feels really nauseated. Not able to eat well.  He stated that the worse day was Saturday. He hasn't been able to belch or vomit even when he has tried to force himself to do this. Last BM was this morning but it was small, he stated that he had a better BM yesterday.   He reports that it feels as if the food is stopping in his esophagus.

## 2019-06-25 NOTE — Progress Notes (Signed)
Virtual Visit via Video Note  I connected with Frederick Yang on 06/25/19 at 10:50 AM EST by a video enabled telemedicine application and verified that I am speaking with the correct person using two identifiers.   I discussed the limitations of evaluation and management by telemedicine and the availability of in person appointments. The patient expressed understanding and agreed to proceed.  Subjective:    CC: Nausea  HPI:  Pt reports that he feels really nauseated.  It really has been on and off since I spoke with him on November 10.  He says some days will feel better and then it seems to come back.  Not able to eat well.  Has been using the Zofran and that does seem to help but he is about to run out.  He had an upper endoscopy approximately 6 weeks ago and was diagnosed with antral ulcers.  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  Sometimes eating makes nausea worse but sometimes it does not.  He is just really lost his appetite.  He stated that the worse day was Saturday. He hasn't been able to belch or vomit even when he has tried to force himself to do this. Last BM was this morning but it was small, he stated that he had a better BM yesterday.  He has been having to use MiraLAX or Dulcolax to have a bowel movement which is not typical for him.  He reports that it feels as if the food is stopping in his esophagus.  He just feels like he needs to vomit but cannot and even tried to make himself vomit just to try to get relief and could not.  He missed his Dexilant on either Thursday or Friday and says since then is when he feels like the nausea has been worse.   He is also been getting some left upper quadrant pain radiating to the epigastric area.  He is also over the last day or two had some low back pain just above his waistline.  Hx of fatty liver as well.   Past medical history, Surgical history, Family history not pertinant except as noted below, Social history, Allergies,  and medications have been entered into the medical record, reviewed, and corrections made.   Review of Systems: No fevers, chills, night sweats, weight loss, chest pain, or shortness of breath.   Objective:    General: Speaking clearly in complete sentences without any shortness of breath.  Alert and oriented x3.  Normal judgment. No apparent acute distress.    Impression and Recommendations:   Left upper quadrant pain- consider pancreatitis.  Will check labs for further work-up and evaluation.  Also check CBC to see if white blood cells are elevated to indicate any type of underlying infection.  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  Also get a KUB sounds like he has also been battling with some constipation but has been proactive in taking things to move his bowels.  Consider further work-up with CT if not improving.  Nausea without vomiting-we will try adding Carafate to his Dexilant and refill the Zofran as well just for symptom relief.    I discussed the assessment and treatment plan with the patient. The patient was provided an opportunity to ask questions and all were answered. The patient agreed with the plan and demonstrated an understanding of the instructions.   The patient was advised to call back or seek  an in-person evaluation if the symptoms worsen or if the condition fails to improve as anticipated.   Beatrice Lecher, MD

## 2019-06-26 ENCOUNTER — Telehealth: Payer: Self-pay

## 2019-06-26 LAB — CBC WITH DIFFERENTIAL/PLATELET
Absolute Monocytes: 660 cells/uL (ref 200–950)
Basophils Absolute: 42 cells/uL (ref 0–200)
Basophils Relative: 0.7 %
Eosinophils Absolute: 72 cells/uL (ref 15–500)
Eosinophils Relative: 1.2 %
HCT: 49.7 % (ref 38.5–50.0)
Hemoglobin: 15.9 g/dL (ref 13.2–17.1)
Lymphs Abs: 2286 cells/uL (ref 850–3900)
MCH: 27.5 pg (ref 27.0–33.0)
MCHC: 32 g/dL (ref 32.0–36.0)
MCV: 86 fL (ref 80.0–100.0)
MPV: 13.2 fL — ABNORMAL HIGH (ref 7.5–12.5)
Monocytes Relative: 11 %
Neutro Abs: 2940 cells/uL (ref 1500–7800)
Neutrophils Relative %: 49 %
Platelets: 160 10*3/uL (ref 140–400)
RBC: 5.78 10*6/uL (ref 4.20–5.80)
RDW: 12.7 % (ref 11.0–15.0)
Total Lymphocyte: 38.1 %
WBC: 6 10*3/uL (ref 3.8–10.8)

## 2019-06-26 LAB — TSH: TSH: 1.39 mIU/L (ref 0.40–4.50)

## 2019-06-26 LAB — AMYLASE: Amylase: 24 U/L (ref 21–101)

## 2019-06-26 LAB — LIPASE: Lipase: 15 U/L (ref 7–60)

## 2019-06-26 NOTE — Telephone Encounter (Signed)
He reports the waking up and "gasping for breath" since the start of all the symptoms. I did call and the sleep center is going to put him on a wait list. She will also try to call other patient's to move them to try to get him in sooner.

## 2019-06-26 NOTE — Telephone Encounter (Signed)
Can we see if we can get the overnight sleep study moved up.  Not sure why it so far out into March.  That seems like a long wait to me.  Maybe we can check on that.  Winded this waking up with shortness of breath start?  I do not remember him mentioning that when we did an virtual visit the other day.  He was complaining more about the nausea and the stomach pain.

## 2019-06-26 NOTE — Telephone Encounter (Signed)
Frederick Yang states he is unable to sleep through the night. He has multiple episodes of waking and having a feeling he can't breath, more than 5 every night. He reports this problem no matter where he sleeps or how he sleeps. Such as in a recliner, sleeping sitting up. He did check his oxygen last night after and episode and his O2 was 88%. He states the nausea and stomach medication is not really helping with the sleep issue. He states he has an in home sleep study scheduled for March. Please advise.

## 2019-06-29 DIAGNOSIS — I499 Cardiac arrhythmia, unspecified: Secondary | ICD-10-CM | POA: Insufficient documentation

## 2019-06-29 DIAGNOSIS — I5022 Chronic systolic (congestive) heart failure: Secondary | ICD-10-CM | POA: Insufficient documentation

## 2019-06-29 DIAGNOSIS — R079 Chest pain, unspecified: Secondary | ICD-10-CM | POA: Insufficient documentation

## 2019-06-29 DIAGNOSIS — I509 Heart failure, unspecified: Secondary | ICD-10-CM

## 2019-06-29 HISTORY — DX: Heart failure, unspecified: I50.9

## 2019-06-30 DIAGNOSIS — I420 Dilated cardiomyopathy: Secondary | ICD-10-CM | POA: Insufficient documentation

## 2019-07-01 NOTE — Telephone Encounter (Signed)
How did he do over the weekend?  Is he feeling better.

## 2019-07-02 ENCOUNTER — Encounter: Payer: Self-pay | Admitting: Family Medicine

## 2019-07-02 MED ORDER — DAPAGLIFLOZIN-METFORMIN HCL ER 10-500 MG PO TB24
1.00 | ORAL_TABLET | ORAL | Status: DC
Start: 2019-07-03 — End: 2019-07-02

## 2019-07-02 MED ORDER — POLYETHYLENE GLYCOL 3350 17 GM/SCOOP PO POWD
17.00 | ORAL | Status: DC
Start: ? — End: 2019-07-02

## 2019-07-02 MED ORDER — CELECOXIB 200 MG PO CAPS
200.00 | ORAL_CAPSULE | ORAL | Status: DC
Start: ? — End: 2019-07-02

## 2019-07-02 MED ORDER — ALBUTEROL SULFATE (2.5 MG/3ML) 0.083% IN NEBU
2.50 | INHALATION_SOLUTION | RESPIRATORY_TRACT | Status: DC
Start: ? — End: 2019-07-02

## 2019-07-02 MED ORDER — FUROSEMIDE 40 MG PO TABS
80.00 | ORAL_TABLET | ORAL | Status: DC
Start: 2019-07-03 — End: 2019-07-02

## 2019-07-02 MED ORDER — ATORVASTATIN CALCIUM 20 MG PO TABS
20.00 | ORAL_TABLET | ORAL | Status: DC
Start: 2019-07-03 — End: 2019-07-02

## 2019-07-02 MED ORDER — PANTOPRAZOLE SODIUM 40 MG PO TBEC
40.00 | DELAYED_RELEASE_TABLET | ORAL | Status: DC
Start: 2019-07-03 — End: 2019-07-02

## 2019-07-02 MED ORDER — GENERIC EXTERNAL MEDICATION
Status: DC
Start: ? — End: 2019-07-02

## 2019-07-02 MED ORDER — ACETAMINOPHEN 325 MG PO TABS
650.00 | ORAL_TABLET | ORAL | Status: DC
Start: ? — End: 2019-07-02

## 2019-07-02 MED ORDER — CARVEDILOL 6.25 MG PO TABS
6.25 | ORAL_TABLET | ORAL | Status: DC
Start: 2019-07-02 — End: 2019-07-02

## 2019-07-02 MED ORDER — TRAMADOL HCL 50 MG PO TABS
50.00 | ORAL_TABLET | ORAL | Status: DC
Start: ? — End: 2019-07-02

## 2019-07-02 MED ORDER — SUCRALFATE 1 GM/10ML PO SUSP
1.00 | ORAL | Status: DC
Start: 2019-07-02 — End: 2019-07-02

## 2019-07-02 MED ORDER — SODIUM CHLORIDE 0.9 % IV SOLN
10.00 | INTRAVENOUS | Status: DC
Start: ? — End: 2019-07-02

## 2019-07-02 MED ORDER — ENOXAPARIN SODIUM 40 MG/0.4ML ~~LOC~~ SOLN
40.00 | SUBCUTANEOUS | Status: DC
Start: 2019-07-02 — End: 2019-07-02

## 2019-07-02 MED ORDER — NITROGLYCERIN 0.4 MG SL SUBL
0.40 | SUBLINGUAL_TABLET | SUBLINGUAL | Status: DC
Start: ? — End: 2019-07-02

## 2019-07-02 MED ORDER — HYDRALAZINE HCL 20 MG/ML IJ SOLN
10.00 | INTRAMUSCULAR | Status: DC
Start: ? — End: 2019-07-02

## 2019-07-02 MED ORDER — SACUBITRIL-VALSARTAN 24-26 MG PO TABS
1.00 | ORAL_TABLET | ORAL | Status: DC
Start: 2019-07-02 — End: 2019-07-02

## 2019-07-02 MED ORDER — ALUM & MAG HYDROXIDE-SIMETH 200-200-20 MG/5ML PO SUSP
30.00 | ORAL | Status: DC
Start: ? — End: 2019-07-02

## 2019-07-02 NOTE — Telephone Encounter (Signed)
He is in the hospital with [facility] for congestive heart failure. He is doing better and will probably get released today.

## 2019-07-05 ENCOUNTER — Encounter: Payer: Self-pay | Admitting: Sports Medicine

## 2019-07-05 ENCOUNTER — Ambulatory Visit (INDEPENDENT_AMBULATORY_CARE_PROVIDER_SITE_OTHER): Payer: Commercial Managed Care - PPO | Admitting: Sports Medicine

## 2019-07-05 ENCOUNTER — Other Ambulatory Visit: Payer: Self-pay

## 2019-07-05 DIAGNOSIS — I420 Dilated cardiomyopathy: Secondary | ICD-10-CM | POA: Diagnosis not present

## 2019-07-05 MED ORDER — ASPIRIN EC 81 MG PO TBEC: 81.0000 mg | DELAYED_RELEASE_TABLET | Freq: Every day | ORAL | 3 refills | Status: DC

## 2019-07-05 MED ORDER — SPIRONOLACTONE 25 MG PO TABS: 25.0000 mg | ORAL_TABLET | Freq: Every day | ORAL | 3 refills | Status: DC

## 2019-07-05 MED ORDER — FUROSEMIDE 20 MG PO TABS: 20.0000 mg | ORAL_TABLET | Freq: Every day | ORAL | 3 refills | Status: DC

## 2019-07-05 NOTE — Progress Notes (Signed)
    Procedures performed today:    None.  Independent interpretation of tests performed by another provider:   I have reviewed his echocardiogram, no valvular disease but he does have an ejection fraction of 15 to 20%.  I have reviewed his labs from his hospitalization, ultimately he did have a normal potassium and renal function at discharge.  proBNP was 1600 at admission.  I have also reviewed the discharge summary from his hospitalization.  Impression and Recommendations:    Dilated cardiomyopathy ([place]) Several days of worsening shortness of breath, orthopnea, followed by progressive lower extremity swelling. Acute CHF exacerbation, ejection fraction 15 to 20%. He was aggressively diuresed in the hospital, approximately 6L. Discharged appropriately on Entresto, furosemide, carvedilol. Decreasing furosemide to 20 mg daily and adding spironolactone 25 mg. Adding a baby aspirin. We do need to look at his potassium and renal function, adding a BNP to track his heart failure exacerbation. He does have an appointment with cardiology coming up in about 2 weeks, I think we can manage him until then.  Certainly he feels significantly better than prior. I did discuss with him the following steps including likely cardiac catheterization considering his diabetes this is likely ischemic heart failure, he will likely also have an implantable cardioverter defibrillator as his ejection fraction is less than 30%. I had like him to see his PCP next week to ensure things are going well. Ultimately I think his target dry weight should be around 270-275 pounds.    ___________________________________________ Gwen Her. Dianah Field, M.D., ABFM., CAQSM. Primary Care and [place] Instructor of [place] [facility]

## 2019-07-05 NOTE — Assessment & Plan Note (Addendum)
Several days of worsening shortness of breath, orthopnea, followed by progressive lower extremity swelling. Acute CHF exacerbation, ejection fraction 15 to 20%. He was aggressively diuresed in the hospital, approximately 6L. Discharged appropriately on Entresto, furosemide, carvedilol. Decreasing furosemide to 20 mg daily and adding spironolactone 25 mg. Adding a baby aspirin. We do need to look at his potassium and renal function, adding a BNP to track his heart failure exacerbation. He does have an appointment with cardiology coming up in about 2 weeks, I think we can manage him until then.  Certainly he feels significantly better than prior. I did discuss with him the following steps including likely cardiac catheterization considering his diabetes this is likely ischemic heart failure, he will likely also have an implantable cardioverter defibrillator as his ejection fraction is less than 30%. I had like him to see his PCP next week to ensure things are going well. Ultimately I think his target dry weight should be around 270-275 pounds.

## 2019-07-06 LAB — COMPLETE METABOLIC PANEL WITH GFR
AG Ratio: 1.2 (calc) (ref 1.0–2.5)
ALT: 16 U/L (ref 9–46)
AST: 18 U/L (ref 10–35)
Albumin: 4.2 g/dL (ref 3.6–5.1)
Alkaline phosphatase (APISO): 64 U/L (ref 35–144)
BUN: 19 mg/dL (ref 7–25)
CO2: 31 mmol/L (ref 20–32)
Calcium: 10.2 mg/dL (ref 8.6–10.3)
Chloride: 101 mmol/L (ref 98–110)
Creat: 1.07 mg/dL (ref 0.70–1.33)
GFR, Est African American: 89 mL/min/{1.73_m2} (ref >=60)
GFR, Est Non African American: 77 mL/min/{1.73_m2} (ref >=60)
Globulin: 3.4 g/dL (calc) (ref 1.9–3.7)
Glucose, Bld: 95 mg/dL (ref 65–99)
Potassium: 4.7 mmol/L (ref 3.5–5.3)
Sodium: 139 mmol/L (ref 135–146)
Total Bilirubin: 0.8 mg/dL (ref 0.2–1.2)
Total Protein: 7.6 g/dL (ref 6.1–8.1)

## 2019-07-06 LAB — BRAIN NATRIURETIC PEPTIDE: Brain Natriuretic Peptide: 166 pg/mL — ABNORMAL HIGH (ref <100)

## 2019-07-06 LAB — CBC
HCT: 54.2 % — ABNORMAL HIGH (ref 38.5–50.0)
Hemoglobin: 18 g/dL — ABNORMAL HIGH (ref 13.2–17.1)
MCH: 28.7 pg (ref 27.0–33.0)
MCHC: 33.2 g/dL (ref 32.0–36.0)
MCV: 86.4 fL (ref 80.0–100.0)
MPV: 13.1 fL — ABNORMAL HIGH (ref 7.5–12.5)
Platelets: 224 10*3/uL (ref 140–400)
RBC: 6.27 10*6/uL — ABNORMAL HIGH (ref 4.20–5.80)
RDW: 12.9 % (ref 11.0–15.0)
WBC: 6 10*3/uL (ref 3.8–10.8)

## 2019-07-06 LAB — TSH: TSH: 1.93 mIU/L (ref 0.40–4.50)

## 2019-07-12 ENCOUNTER — Other Ambulatory Visit: Payer: Self-pay

## 2019-07-12 ENCOUNTER — Encounter: Payer: Self-pay | Admitting: Family Medicine

## 2019-07-12 ENCOUNTER — Ambulatory Visit (INDEPENDENT_AMBULATORY_CARE_PROVIDER_SITE_OTHER): Payer: Commercial Managed Care - PPO | Admitting: Family Medicine

## 2019-07-12 VITALS — BP 101/63 | HR 80 | Ht 67.0 in | Wt 268.0 lb

## 2019-07-12 DIAGNOSIS — R0789 Other chest pain: Secondary | ICD-10-CM

## 2019-07-12 DIAGNOSIS — I509 Heart failure, unspecified: Secondary | ICD-10-CM

## 2019-07-12 DIAGNOSIS — I420 Dilated cardiomyopathy: Secondary | ICD-10-CM | POA: Diagnosis not present

## 2019-07-12 DIAGNOSIS — I9589 Other hypotension: Secondary | ICD-10-CM

## 2019-07-12 DIAGNOSIS — E861 Hypovolemia: Secondary | ICD-10-CM

## 2019-07-12 NOTE — Progress Notes (Signed)
Established Patient Office Visit  Subjective:  Patient ID: Frederick Yang, male    DOB: 02/21/1961  Age: 59 y.o. MRN: [id number]  CC:  Chief Complaint  Patient presents with  . Follow-up    HPI Frederick Yang presents for follow-up on new medications.  He was actually admitted to [facility] on January 2 for acute congestive heart failure with dyspnea and lower extremity edema.  He had not been diagnosed previously.  He was also found to have dilated cardiomyopathy.  He did hospital follow-up on January 8 with Dr. Dianah Field.  His ejection fraction was low at 15 to 20%.  He was discharged home on Entresto, furosemide and carvedilol.  At his appointment on the eighth they decrease his furosemide to 20 mg daily and added 25 mg of spironolactone as well as a baby aspirin.  Will need follow-up labs today.  He does have a follow-up cardiology appointment in about 1 week.  He says he started to feel better but then has felt bad again the last 2 days. His weight is down about 5 lbs from day if discharge.  He has been weighing himself daily.  He just feels really tired and most weak.  He does not feel as lightheaded and dizzy as he did when he came in for his hospital follow-up and saw Dr. Dianah Field.  But now just feels weak.  He also has had some recurrence of nausea.  Is been try to minimize his fluid intake to about 1-1/2 L/day.  Home blood pressure this morning was 95/80.  He also reports he has been getting some intermittent sharp and dull pains in his chest sometimes on the left side sometimes on the right side.  He says that can last up to a minute at a time but it does seem to ease off but it has happened multiple times a day.   Still having difficulty with swallowing and feeling like things are getting stuck at home.  He actually had an EGD recently and did have an esophageal dilatation but does not like his symptoms have actually improved.  Past Medical History:  Diagnosis Date   . Congestive heart failure, NYHA class 3 ([place]) 06/29/2019  . Diabetes mellitus without complication ([place])   . GERD (gastroesophageal reflux disease)   . Hepatic steatosis   . Hyperlipidemia   . Prostate hypertrophy     Past Surgical History:  Procedure Laterality Date  . APPENDECTOMY    . COLON RESECTION SIGMOID  11/2015  . HEMORRHOID BANDING  06/24/13   Dr. Ludwig Lean  . HEMORRHOID BANDING  07/12/2013  . HERNIA REPAIR     RT groin  . right shoulder surgery  03/06/2014   Dr. Tamera Punt   . TREATMENT FISTULA ANAL      Family History  Problem Relation Age of Onset  . Breast cancer Mother 22  . Uterine cancer Mother   . Alcohol abuse Maternal Uncle   . Heart attack Maternal Uncle   . Stroke Maternal Uncle   . Lung cancer Maternal Grandfather   . Diabetes Maternal Aunt   . Hypertension Brother   . Hypertension Sister   . Diabetes Sister     Social History   Socioeconomic History  . Marital status: Married    Spouse name: Not on file  . Number of children: 3   . Years of education: Not on file  . Highest education level: Not on file  Occupational History    Employer:  [employer]  Tobacco Use  . Smoking status: Former Smoker    Types: Cigarettes    Quit date: 06/27/1988    Years since quitting: 31.0  . Smokeless tobacco: Never Used  Substance and Sexual Activity  . Alcohol use: Yes  . Drug use: No  . Sexual activity: Not on file  Other Topics Concern  . Not on file  Social History Narrative   Started some exercise. Occ caffeine but not daily.    Social Determinants of Health   Financial Resource Strain:   . Difficulty of Paying Living Expenses: Not on file  Food Insecurity:   . Worried About Charity fundraiser in the Last Year: Not on file  . Ran Out of Food in the Last Year: Not on file  Transportation Needs:   . Lack of Transportation (Medical): Not on file  . Lack of Transportation (Non-Medical): Not on file  Physical Activity:   . Days of Exercise  per Week: Not on file  . Minutes of Exercise per Session: Not on file  Stress:   . Feeling of Stress : Not on file  Social Connections:   . Frequency of Communication with Friends and Family: Not on file  . Frequency of Social Gatherings with Friends and Family: Not on file  . Attends Religious Services: Not on file  . Active Member of Clubs or Organizations: Not on file  . Attends Archivist Meetings: Not on file  . Marital Status: Not on file  Intimate Partner Violence:   . Fear of Current or Ex-Partner: Not on file  . Emotionally Abused: Not on file  . Physically Abused: Not on file  . Sexually Abused: Not on file    Outpatient Medications Prior to Visit  Medication Sig Dispense Refill  . aspirin EC 81 MG tablet Take 1 tablet (81 mg total) by mouth daily. 90 tablet 3  . atorvastatin (LIPITOR) 20 MG tablet Take 1 tablet (20 mg total) by mouth at bedtime. 90 tablet 3  . calcium-vitamin D (OSCAL WITH D) 500-200 MG-UNIT TABS tablet Take 1 tablet by mouth every evening.    . carvedilol (COREG) 6.25 MG tablet Take 1 tablet by mouth 2 (two) times daily.    . celecoxib (CELEBREX) 200 MG capsule 1 TO 2 TABLETS BY MOUTH DAILY AS NEEDED FOR PAIN. 180 capsule 0  . Cinnamon 500 MG TABS Take 1,000 mg by mouth every evening.    Marland Kitchen Dexlansoprazole (DEXILANT) 30 MG capsule Take 1 capsule (30 mg total) by mouth daily. 30 capsule 1  . Flaxseed, Linseed, (FLAXSEED OIL) 1000 MG CAPS Take 1 capsule by mouth every evening.    . fluticasone (FLONASE) 50 MCG/ACT nasal spray SPRAY 2 SPRAYS INTO EACH NOSTRIL EVERY DAY 48 mL 1  . furosemide (LASIX) 20 MG tablet Take 1 tablet (20 mg total) by mouth daily. 30 tablet 3  . Garlic 0000000 MG CAPS Take 1 capsule by mouth every evening.    Marland Kitchen MAGNESIUM PO Take 1 tablet by mouth daily.    . Misc Natural Products (GLUCOSAMINE CHONDROITIN TRIPLE PO) Take 2 tablets by mouth daily.    . Multiple Vitamin (MULTIVITAMIN) capsule Take 1 capsule by mouth every evening.     . niacin 500 MG tablet Take 1 tablet by mouth daily.    . Omega-3 Fatty Acids (FISH OIL) 1000 MG CAPS Take 1 capsule by mouth every evening.    . ondansetron (ZOFRAN ODT) 4 MG disintegrating tablet Take 1  tablet (4 mg total) by mouth every 8 (eight) hours as needed for nausea or vomiting. 30 tablet 1  . sacubitril-valsartan (ENTRESTO) 24-26 MG Take 1 tablet by mouth 2 (two) times daily.    Marland Kitchen spironolactone (ALDACTONE) 25 MG tablet Take 1 tablet (25 mg total) by mouth daily. 30 tablet 3  . sucralfate (CARAFATE) 1 g tablet Take 1 tablet (1 g total) by mouth 4 (four) times daily -  with meals and at bedtime. 40 tablet 1  . XIGDUO XR 10-500 MG TB24 Take 1 tablet by mouth daily. 30 tablet 5   No facility-administered medications prior to visit.    No Known Allergies  ROS Review of Systems    Objective:    Physical Exam  Constitutional: He is oriented to person, place, and time. He appears well-developed and well-nourished.  HENT:  Head: Normocephalic and atraumatic.  Cardiovascular: Normal rate, regular rhythm and normal heart sounds.  Pulmonary/Chest: Effort normal and breath sounds normal.  Neurological: He is alert and oriented to person, place, and time.  Skin: Skin is warm and dry.  Psychiatric: He has a normal mood and affect. His behavior is normal.    BP 101/63   Pulse 80   Ht 5\' 7"  (1.702 m)   Wt 268 lb (121.6 kg)   SpO2 99%   BMI 41.97 kg/m  Wt Readings from Last 3 Encounters:  07/12/19 268 lb (121.6 kg)  07/05/19 270 lb 0.6 oz (122.5 kg)  05/07/19 284 lb (128.8 kg)     There are no preventive care reminders to display for this patient.  There are no preventive care reminders to display for this patient.  Lab Results  Component Value Date   TSH 1.93 07/05/2019   Lab Results  Component Value Date   WBC 6.0 07/05/2019   HGB 18.0 (H) 07/05/2019   HCT 54.2 (H) 07/05/2019   MCV 86.4 07/05/2019   PLT 224 07/05/2019   Lab Results  Component Value Date    NA 136 07/12/2019   K 5.0 07/12/2019   CO2 27 07/12/2019   GLUCOSE 104 (H) 07/12/2019   BUN 14 07/12/2019   CREATININE 0.96 07/12/2019   BILITOT 0.8 07/05/2019   ALKPHOS 66 02/07/2017   AST 18 07/05/2019   ALT 16 07/05/2019   PROT 7.6 07/05/2019   ALBUMIN 3.9 02/07/2017   CALCIUM 9.9 07/12/2019   ANIONGAP 7 06/06/2015   Lab Results  Component Value Date   CHOL 208 (H) 05/08/2019   Lab Results  Component Value Date   HDL 35 (L) 05/08/2019   Lab Results  Component Value Date   LDLCALC 142 (H) 05/08/2019   Lab Results  Component Value Date   TRIG 176 (H) 05/08/2019   Lab Results  Component Value Date   CHOLHDL 5.9 (H) 05/08/2019   Lab Results  Component Value Date   HGBA1C 7.4 (A) 05/07/2019      Assessment & Plan:   Problem List Items Addressed This Visit      Cardiovascular and Mediastinum   Dilated cardiomyopathy ([place]) - Primary    On Enestro, BB, lasix, and sprinolactone.  Keep f/u with cards next week. Check BMP      Relevant Orders   BASIC METABOLIC PANEL WITH GFR (Completed)   Congestive heart failure, NYHA class 3 ([facility])    Lipid restrict to approximately 1.5 to no more than 2.0 L/day.  Continue to weigh himself daily. Cut carvedilol in half because of hypotension.  We will  get labs to see if it looks like he is also a little dehydrated in which case we may need to adjust his spironolactone or Lasix.      Relevant Orders   BASIC METABOLIC PANEL WITH GFR (Completed)    Other Visit Diagnoses    Hypotension due to hypovolemia       Atypical chest pain         Atypical chest pain-unclear etiology he does not appear to be volume overloaded today and in fact I am a little bit concerned he may even be a little dehydrated but he also has changed his diet since being home so may have actually had some weight loss.  Still want him to continue to cut the carvedilol in half for now.  And then will address electrolytes once I see those.  That could also be  contributing to chest pain.  Hypotension - adjust meds as above.    No orders of the defined types were placed in this encounter.   Follow-up: No follow-ups on file.    Beatrice Lecher, MD

## 2019-07-12 NOTE — Patient Instructions (Addendum)
Cut your carvedilol in half.  We may even cut your spironolactone half letter once I get your labs back.  Fluid Restriction With some health conditions, you must restrict your fluid intake. This means that you need to limit the amount of fluid that you drink each day (fluid restriction). When you have a fluid restriction, you must carefully measure and keep track of the amount of fluid that you drink. Your health care provider will identify the specific amount of fluid you are allowed each day (fluid allowance). This amount may depend on several things, such as:  How well your kidneys function.  How much fluid you are keeping (retaining) in your body tissues.  Your blood pressure.  Your heart function.  Your blood sodium level. What is my plan? Your health care provider recommends that you limit your fluid intake to __________ per day. What counts toward my fluid intake? Your fluid intake includes all liquids that you drink, as well as any foods that become liquid at room temperature. The following are examples of some fluids that you will have to restrict:  Tea, coffee, soda, lemonade, milk, water, juice, sports drinks, and nutritional supplement beverages.  Alcoholic beverages.  Cream.  Gravy.  Ice cubes.  Soup and broth. The following are examples of foods that become liquid at room temperature. These foods will also count toward your fluid intake.  Ice cream and ice milk.  Frozen yogurt and sherbet.  Frozen ice pops.  Flavored gelatin. How do I keep track of my fluid intake? Each morning, fill a jug with the amount of water that is equal to your daily fluid allowance. You can use this water as a guideline for fluid allowance. Each time you take in any form of fluid (including ice cubes and foods that become liquid at room temperature), pour an equal amount of water out of the container. This helps you to see how much fluid you are taking in. It also helps you to see how  much more fluid you can take in during the rest of the day. The following conversions may also be helpful in measuring your fluid intake:  1 cup equals 8 oz (240 mL).   cup equals 6 oz (180 mL).  ? cup equals 5? oz (160 mL).   cup equals 4 oz (120 mL).  ? cup equals 2? oz (80 mL).   cup equals 2 oz (60 mL).  2 Tbsp equals 1 oz (30 mL). What are tips for following this plan? General instructions  Make sure that you stay within your recommended fluid allowance each day. Always measure and keep track of your fluids (including ice cubes and foods that become liquid at room temperature).  Use small cups and glasses and learn to sip fluids slowly.  Try frozen fruits between meals, such as grapes or strawberries. These can satisfy thirst without adding to your fluid intake.  Swallow your pills along with meals or soft foods such as applesauce or mashed potatoes, instead of with liquids. Doing this helps you to save your fluid allowance for something that you enjoy. Weigh yourself each day     Weigh yourself every day. Keeping track of your daily weight can help you and your health care provider to notice as soon as possible if you are retaining too much fluid in your body.  Follow this sequence every morning: 1. Urinate. 2. Weigh yourself. 3. Eat breakfast.  Wear the same amount of clothing each time you weigh yourself.  Write down your daily weight. Give this weight record to your health care provider. If your weight is going up, you may be retaining too much fluid. Every 1 lb (0.45 kg) of body weight that you gain is a sign that your body is retaining 2 cups (480 mL) of fluid.  Manage your thirst  Add lemon juice or a slice of fresh lemon to water or ice. Doing this helps to satisfy your thirst.  Freeze fruit juice or water in an ice cube tray. Use this as part of your fluid allowance. These cubes are useful for quenching your thirst. Before you freeze the juice or water,  measure how much liquid you use to fill a cube section of the ice tray. Subtract this amount from your day's allowance each time you consume a frozen cube.  Avoid salty (high-sodium) foods. These foods make you thirsty and make it more difficult to stay within your daily fluid allowance.  Keep the temperature in your home at a cooler level.  Keep the air in your home as humid as possible. Dry air increases thirst.  Avoid being out in the hot sun, which can cause you to sweat and become thirsty.  To help avoid dry mouth, brush your teeth often or rinse out your mouth with mouthwash. Lemon wedges, hard sour candies, chewing gum, or breath spray may also help to moisten your mouth. What are some signs that I may be taking in too much fluid? You may be taking in too much fluid if:  Your weight increases. Contact your health care provider if you gain weight rapidly.  Your face, hands, legs, feet, and abdomen start to swell.  You have trouble breathing. Summary  With some health conditions, you must limit (restrict) your fluid intake. This means that you need to limit the amount of fluid you drink each day (fluid restriction). Your health care provider will identify the specific amount of fluid that you are allowed each day.  When you have a fluid restriction, you must carefully measure and keep track of the amount of fluid that you drink.  Your fluid intake includes all liquids that you drink, as well as any foods that become liquid at room temperature (such as ice cream and gelatin).  You may be taking in too much fluid if your weight increases, your body starts to swell, or you have trouble breathing. This information is not intended to replace advice given to you by your health care provider. Make sure you discuss any questions you have with your health care provider. Document Revised: 10/04/2018 Document Reviewed: 02/15/2017 Elsevier Patient Education  2020 [employer].

## 2019-07-12 NOTE — Assessment & Plan Note (Signed)
Lipid restrict to approximately 1.5 to no more than 2.0 L/day.  Continue to weigh himself daily. Cut carvedilol in half because of hypotension.  We will get labs to see if it looks like he is also a little dehydrated in which case we may need to adjust his spironolactone or Lasix.

## 2019-07-12 NOTE — Assessment & Plan Note (Signed)
On Enestro, BB, lasix, and sprinolactone.  Keep f/u with cards next week. Check BMP

## 2019-07-13 LAB — BASIC METABOLIC PANEL WITH GFR
BUN: 14 mg/dL (ref 7–25)
CO2: 27 mmol/L (ref 20–32)
Calcium: 9.9 mg/dL (ref 8.6–10.3)
Chloride: 101 mmol/L (ref 98–110)
Creat: 0.96 mg/dL (ref 0.70–1.33)
GFR, Est African American: 102 mL/min/{1.73_m2} (ref >=60)
GFR, Est Non African American: 88 mL/min/{1.73_m2} (ref >=60)
Glucose, Bld: 104 mg/dL — ABNORMAL HIGH (ref 65–99)
Potassium: 5 mmol/L (ref 3.5–5.3)
Sodium: 136 mmol/L (ref 135–146)

## 2019-07-15 NOTE — Progress Notes (Signed)
All labs are normal. 

## 2019-07-30 ENCOUNTER — Other Ambulatory Visit: Payer: Self-pay | Admitting: Family Medicine

## 2019-08-27 ENCOUNTER — Other Ambulatory Visit: Payer: Self-pay

## 2019-08-27 ENCOUNTER — Encounter: Payer: Self-pay | Admitting: Family Medicine

## 2019-08-27 ENCOUNTER — Ambulatory Visit (INDEPENDENT_AMBULATORY_CARE_PROVIDER_SITE_OTHER): Payer: Commercial Managed Care - PPO | Admitting: Family Medicine

## 2019-08-27 VITALS — BP 109/43 | HR 78 | Ht 67.0 in | Wt 270.0 lb

## 2019-08-27 DIAGNOSIS — I509 Heart failure, unspecified: Secondary | ICD-10-CM | POA: Diagnosis not present

## 2019-08-27 DIAGNOSIS — E119 Type 2 diabetes mellitus without complications: Secondary | ICD-10-CM

## 2019-08-27 LAB — POCT GLYCOSYLATED HEMOGLOBIN (HGB A1C): Hemoglobin A1C: 7.3 % — AB (ref 4.0–5.6)

## 2019-08-27 MED ORDER — DEXILANT 30 MG PO CPDR: 30.0000 mg | DELAYED_RELEASE_CAPSULE | Freq: Every day | ORAL | 1 refills | Status: DC

## 2019-08-27 NOTE — Assessment & Plan Note (Addendum)
Uncontrolled.  Hemoglobin A1c still elevated today.  We discussed continue to work on diet and exercise he wants to hold off on any major medication changes for now which I think is very reasonable and hopefully as he gets back into his walking routine blood sugars will start to trend downward.  He will continue to keep an eye on them.  Xigduo.

## 2019-08-27 NOTE — Progress Notes (Addendum)
Established Patient Office Visit  Subjective:  Patient ID: Frederick Yang, male    DOB: 1961/03/21  Age: 59 y.o. MRN: [id number]  CC:  Chief Complaint  Patient presents with  . Annual Exam    HPI [name] presents for   Diabetes - no hypoglycemic events. No wounds or sores that are not healing well. No increased thirst or urination. Checking glucose at home. Taking medications as prescribed without any side effects.  He has not been able to exercise ever since he was diagnosed with congestive heart failure.  Severe congestive heart failure-last time he went to the cardiologist they did make some adjustments.  He is no longer on spironolactone.  They did increase his carvedilol back to a whole tab of the 6.25 mg.  His blood pressures overall have still been running a little bit low in the upper 90s over low 100s but he is actually been feeling well he is only had maybe one episode of feeling a little lightheaded or dizzy.  He is also on Entresto and has been tolerating that as well.  They discussed possibly backing off on his diuretic but says he is decided to stick with it for now he has not had any increase in lower extremity swelling and feels like that is been well controlled.  Really within the last 2 days he started walking about a half a mile a day to really start to build up his exercise tolerance again.  Is a follow-up again with cardiology next week and they are getting him scheduled for CT scan. Reports occ cchest pain.     Past Medical History:  Diagnosis Date  . Congestive heart failure, NYHA class 3 ([place]) 06/29/2019  . Diabetes mellitus without complication ([place])   . GERD (gastroesophageal reflux disease)   . Hepatic steatosis   . Hyperlipidemia   . Prostate hypertrophy     Past Surgical History:  Procedure Laterality Date  . APPENDECTOMY    . COLON RESECTION SIGMOID  11/2015  . HEMORRHOID BANDING  06/24/13   Dr. Ludwig Lean  . HEMORRHOID BANDING  07/12/2013  .  HERNIA REPAIR     RT groin  . right shoulder surgery  03/06/2014   Dr. Tamera Punt   . TREATMENT FISTULA ANAL      Family History  Problem Relation Age of Onset  . Breast cancer Mother 103  . Uterine cancer Mother   . Alcohol abuse Maternal Uncle   . Heart attack Maternal Uncle   . Stroke Maternal Uncle   . Lung cancer Maternal Grandfather   . Diabetes Maternal Aunt   . Hypertension Brother   . Hypertension Sister   . Diabetes Sister     Social History   Socioeconomic History  . Marital status: Married    Spouse name: Not on file  . Number of children: 3   . Years of education: Not on file  . Highest education level: Not on file  Occupational History    Employer: [employer]  Tobacco Use  . Smoking status: Former Smoker    Types: Cigarettes    Quit date: 06/27/1988    Years since quitting: 31.1  . Smokeless tobacco: Never Used  Substance and Sexual Activity  . Alcohol use: Yes  . Drug use: No  . Sexual activity: Not on file  Other Topics Concern  . Not on file  Social History Narrative   Started some exercise. Occ caffeine but not daily.  Social Determinants of Health   Financial Resource Strain:   . Difficulty of Paying Living Expenses: Not on file  Food Insecurity:   . Worried About Charity fundraiser in the Last Year: Not on file  . Ran Out of Food in the Last Year: Not on file  Transportation Needs:   . Lack of Transportation (Medical): Not on file  . Lack of Transportation (Non-Medical): Not on file  Physical Activity:   . Days of Exercise per Week: Not on file  . Minutes of Exercise per Session: Not on file  Stress:   . Feeling of Stress : Not on file  Social Connections:   . Frequency of Communication with Friends and Family: Not on file  . Frequency of Social Gatherings with Friends and Family: Not on file  . Attends Religious Services: Not on file  . Active Member of Clubs or Organizations: Not on file  . Attends Archivist Meetings:  Not on file  . Marital Status: Not on file  Intimate Partner Violence:   . Fear of Current or Ex-Partner: Not on file  . Emotionally Abused: Not on file  . Physically Abused: Not on file  . Sexually Abused: Not on file    Outpatient Medications Prior to Visit  Medication Sig Dispense Refill  . aspirin EC 81 MG tablet Take 1 tablet (81 mg total) by mouth daily. 90 tablet 3  . atorvastatin (LIPITOR) 20 MG tablet Take 1 tablet (20 mg total) by mouth at bedtime. 90 tablet 3  . carvedilol (COREG) 6.25 MG tablet Take 1 tablet by mouth 2 (two) times daily.    Steffanie Dunn 5 MG TABS tablet Take 5 mg by mouth 2 (two) times daily.  11  . fluticasone (FLONASE) 50 MCG/ACT nasal spray SPRAY 2 SPRAYS INTO EACH NOSTRIL EVERY DAY 48 mL 1  . sacubitril-valsartan (ENTRESTO) 24-26 MG Take 1 tablet by mouth 2 (two) times daily.    Marland Kitchen XIGDUO XR 10-500 MG TB24 Take 1 tablet by mouth daily. 30 tablet 5  . DEXILANT 30 MG capsule TAKE 1 CAPSULE BY MOUTH EVERY DAY 30 capsule 1  . calcium-vitamin D (OSCAL WITH D) 500-200 MG-UNIT TABS tablet Take 1 tablet by mouth every evening.    . celecoxib (CELEBREX) 200 MG capsule 1 TO 2 TABLETS BY MOUTH DAILY AS NEEDED FOR PAIN. 180 capsule 0  . Cinnamon 500 MG TABS Take 1,000 mg by mouth every evening.    . Flaxseed, Linseed, (FLAXSEED OIL) 1000 MG CAPS Take 1 capsule by mouth every evening.    . furosemide (LASIX) 20 MG tablet Take 1 tablet (20 mg total) by mouth daily. 30 tablet 3  . Garlic 0000000 MG CAPS Take 1 capsule by mouth every evening.    Marland Kitchen MAGNESIUM PO Take 1 tablet by mouth daily.    . Misc Natural Products (GLUCOSAMINE CHONDROITIN TRIPLE PO) Take 2 tablets by mouth daily.    . Multiple Vitamin (MULTIVITAMIN) capsule Take 1 capsule by mouth every evening.    . niacin 500 MG tablet Take 1 tablet by mouth daily.    . Omega-3 Fatty Acids (FISH OIL) 1000 MG CAPS Take 1 capsule by mouth every evening.    . ondansetron (ZOFRAN ODT) 4 MG disintegrating tablet Take 1 tablet  (4 mg total) by mouth every 8 (eight) hours as needed for nausea or vomiting. 30 tablet 1  . spironolactone (ALDACTONE) 25 MG tablet Take 1 tablet (25 mg total) by mouth daily.  30 tablet 3  . sucralfate (CARAFATE) 1 g tablet Take 1 tablet (1 g total) by mouth 4 (four) times daily -  with meals and at bedtime. 40 tablet 1   No facility-administered medications prior to visit.    No Known Allergies  ROS Review of Systems    Objective:    Physical Exam  BP (!) 109/43   Pulse 78   Ht 5\' 7"  (1.702 m)   Wt 270 lb (122.5 kg)   SpO2 100%   BMI 42.29 kg/m  Wt Readings from Last 3 Encounters:  08/27/19 270 lb (122.5 kg)  07/12/19 268 lb (121.6 kg)  07/05/19 270 lb 0.6 oz (122.5 kg)     Health Maintenance Due  Topic Date Due  . OPHTHALMOLOGY EXAM  08/25/2019    There are no preventive care reminders to display for this patient.  Lab Results  Component Value Date   TSH 1.93 07/05/2019   Lab Results  Component Value Date   WBC 6.0 07/05/2019   HGB 18.0 (H) 07/05/2019   HCT 54.2 (H) 07/05/2019   MCV 86.4 07/05/2019   PLT 224 07/05/2019   Lab Results  Component Value Date   NA 136 07/12/2019   K 5.0 07/12/2019   CO2 27 07/12/2019   GLUCOSE 104 (H) 07/12/2019   BUN 14 07/12/2019   CREATININE 0.96 07/12/2019   BILITOT 0.8 07/05/2019   ALKPHOS 66 02/07/2017   AST 18 07/05/2019   ALT 16 07/05/2019   PROT 7.6 07/05/2019   ALBUMIN 3.9 02/07/2017   CALCIUM 9.9 07/12/2019   ANIONGAP 7 06/06/2015   Lab Results  Component Value Date   CHOL 208 (H) 05/08/2019   Lab Results  Component Value Date   HDL 35 (L) 05/08/2019   Lab Results  Component Value Date   LDLCALC 142 (H) 05/08/2019   Lab Results  Component Value Date   TRIG 176 (H) 05/08/2019   Lab Results  Component Value Date   CHOLHDL 5.9 (H) 05/08/2019   Lab Results  Component Value Date   HGBA1C 7.3 (A) 08/27/2019      Assessment & Plan:   Problem List Items Addressed This Visit       Cardiovascular and Mediastinum   Congestive heart failure, NYHA class 3 ([facility])   Relevant Medications   CORLANOR 5 MG TABS tablet     Endocrine   Diabetes mellitus without complication ([place]) - Primary     Uncontrolled.  Hemoglobin A1c still elevated today.  We discussed continue to work on diet and exercise he wants to hold off on any major medication changes for now which I think is very reasonable and hopefully as he gets back into his walking routine blood sugars will start to trend downward.  He will continue to keep an eye on them.  Xigduo.      Relevant Orders   POCT glycosylated hemoglobin (Hb A1C) (Completed)        Meds ordered this encounter  Medications  . Dexlansoprazole (DEXILANT) 30 MG capsule    Sig: Take 1 capsule (30 mg total) by mouth daily.    Dispense:  90 capsule    Refill:  1    Follow-up: Return in about 4 months (around 12/27/2019) for Diabetes follow-up.    Beatrice Lecher, MD

## 2019-09-02 ENCOUNTER — Other Ambulatory Visit: Payer: Self-pay | Admitting: Family Medicine

## 2019-09-02 ENCOUNTER — Encounter (HOSPITAL_BASED_OUTPATIENT_CLINIC_OR_DEPARTMENT_OTHER): Payer: Commercial Managed Care - PPO | Admitting: Internal Medicine

## 2019-09-26 ENCOUNTER — Other Ambulatory Visit: Payer: Self-pay | Admitting: Sports Medicine

## 2019-09-26 ENCOUNTER — Other Ambulatory Visit: Payer: Self-pay | Admitting: Family Medicine

## 2019-09-26 DIAGNOSIS — I420 Dilated cardiomyopathy: Secondary | ICD-10-CM

## 2019-09-26 NOTE — Telephone Encounter (Signed)
To PCP

## 2019-09-30 ENCOUNTER — Other Ambulatory Visit: Payer: Self-pay | Admitting: Sports Medicine

## 2019-09-30 DIAGNOSIS — I420 Dilated cardiomyopathy: Secondary | ICD-10-CM

## 2019-09-30 NOTE — Telephone Encounter (Signed)
To PCP

## 2019-11-14 DIAGNOSIS — Z950 Presence of cardiac pacemaker: Secondary | ICD-10-CM | POA: Insufficient documentation

## 2019-11-19 ENCOUNTER — Telehealth (INDEPENDENT_AMBULATORY_CARE_PROVIDER_SITE_OTHER): Payer: Commercial Managed Care - PPO | Admitting: Family Medicine

## 2019-11-19 ENCOUNTER — Encounter: Payer: Self-pay | Admitting: Family Medicine

## 2019-11-19 DIAGNOSIS — R05 Cough: Secondary | ICD-10-CM

## 2019-11-19 DIAGNOSIS — K649 Unspecified hemorrhoids: Secondary | ICD-10-CM | POA: Insufficient documentation

## 2019-11-19 DIAGNOSIS — K635 Polyp of colon: Secondary | ICD-10-CM | POA: Insufficient documentation

## 2019-11-19 DIAGNOSIS — M1990 Unspecified osteoarthritis, unspecified site: Secondary | ICD-10-CM | POA: Insufficient documentation

## 2019-11-19 DIAGNOSIS — R059 Cough, unspecified: Secondary | ICD-10-CM | POA: Insufficient documentation

## 2019-11-19 NOTE — Progress Notes (Signed)
Frederick Yang - 59 y.o. male MRN [id number]  Date of birth: 03-20-1961   This visit type was conducted due to national recommendations for restrictions regarding the COVID-19 Pandemic (e.g. social distancing).  This format is felt to be most appropriate for this patient at this time.  All issues noted in this document were discussed and addressed.  No physical exam was performed (except for noted visual exam findings with Video Visits).  I discussed the limitations of evaluation and management by telemedicine and the availability of in person appointments. The patient expressed understanding and agreed to proceed.  I connected with@ on 11/19/19 at  3:20 PM EDT by a video enabled telemedicine application and verified that I am speaking with the correct person using two identifiers.  Present at visit: [name], DO [name]   Patient Location: Home [place] [address]   Provider location:   [facility]  Chief Complaint  Patient presents with  . Cough    HPI  Frederick Yang is a 59 y.o. male who presents via audio/video conferencing for a telehealth visit today.  He has complaint of cough today. He reports that cough started 2 days ago.  Cough productive at times with clear sputum.  He does have history of CHF and had ICD placed about 4 weeks ago.  He has continued on all prescribed medications.  He denies weight increase, edema, shortness of breath, chest pain, palpitations or dizziness.  He has been without fever and denies congestion or wheezing.     ROS:  A comprehensive ROS was completed and negative except as noted per HPI  Past Medical History:  Diagnosis Date  . Congestive heart failure, NYHA class 3 ([place]) 06/29/2019  . Diabetes mellitus without complication ([place])   . GERD (gastroesophageal reflux disease)   . Hepatic steatosis   . Hyperlipidemia   . Prostate hypertrophy     Past Surgical History:  Procedure Laterality Date  . APPENDECTOMY     . COLON RESECTION SIGMOID  11/2015  . HEMORRHOID BANDING  06/24/13   Dr. Ludwig Lean  . HEMORRHOID BANDING  07/12/2013  . HERNIA REPAIR     RT groin  . right shoulder surgery  03/06/2014   Dr. Tamera Punt   . TREATMENT FISTULA ANAL      Family History  Problem Relation Age of Onset  . Breast cancer Mother 47  . Uterine cancer Mother   . Alcohol abuse Maternal Uncle   . Heart attack Maternal Uncle   . Stroke Maternal Uncle   . Lung cancer Maternal Grandfather   . Diabetes Maternal Aunt   . Hypertension Brother   . Hypertension Sister   . Diabetes Sister     Social History   Socioeconomic History  . Marital status: Married    Spouse name: Not on file  . Number of children: 3   . Years of education: Not on file  . Highest education level: Not on file  Occupational History    Employer: [employer]  Tobacco Use  . Smoking status: Former Smoker    Types: Cigarettes    Quit date: 06/27/1988    Years since quitting: 31.4  . Smokeless tobacco: Never Used  Substance and Sexual Activity  . Alcohol use: Yes  . Drug use: No  . Sexual activity: Not on file  Other Topics Concern  . Not on file  Social History Narrative   Started some exercise. Occ caffeine but not daily.  Social Determinants of Health   Financial Resource Strain:   . Difficulty of Paying Living Expenses:   Food Insecurity:   . Worried About Charity fundraiser in the Last Year:   . Arboriculturist in the Last Year:   Transportation Needs:   . Film/video editor (Medical):   Marland Kitchen Lack of Transportation (Non-Medical):   Physical Activity:   . Days of Exercise per Week:   . Minutes of Exercise per Session:   Stress:   . Feeling of Stress :   Social Connections:   . Frequency of Communication with Friends and Family:   . Frequency of Social Gatherings with Friends and Family:   . Attends Religious Services:   . Active Member of Clubs or Organizations:   . Attends Archivist Meetings:   Marland Kitchen  Marital Status:   Intimate Partner Violence:   . Fear of Current or Ex-Partner:   . Emotionally Abused:   Marland Kitchen Physically Abused:   . Sexually Abused:      Current Outpatient Medications:  .  atorvastatin (LIPITOR) 20 MG tablet, TAKE 1 TABLET BY MOUTH EVERYDAY AT BEDTIME, Disp: 90 tablet, Rfl: 3 .  carvedilol (COREG) 6.25 MG tablet, Take 1 tablet by mouth 2 (two) times daily., Disp: , Rfl:  .  CORLANOR 5 MG TABS tablet, Take 5 mg by mouth 2 (two) times daily., Disp: , Rfl: 11 .  Dexlansoprazole (DEXILANT) 30 MG capsule, Take 1 capsule (30 mg total) by mouth daily., Disp: 90 capsule, Rfl: 1 .  fluticasone (FLONASE) 50 MCG/ACT nasal spray, SPRAY 2 SPRAYS INTO EACH NOSTRIL EVERY DAY, Disp: 48 mL, Rfl: 1 .  furosemide (LASIX) 20 MG tablet, TAKE 1 TABLET BY MOUTH EVERY DAY, Disp: 90 tablet, Rfl: 1 .  sacubitril-valsartan (ENTRESTO) 24-26 MG, Take 1 tablet by mouth 2 (two) times daily., Disp: , Rfl:  .  XIGDUO XR 10-500 MG TB24, TAKE 1 TABLET BY MOUTH EVERY DAY, Disp: 30 tablet, Rfl: 4  EXAM:  VITALS per patient if applicable: BP 99991111   Pulse 84   GENERAL: alert, oriented, appears well and in no acute distress  HEENT: atraumatic, conjunttiva clear, no obvious abnormalities on inspection of external nose and ears  NECK: normal movements of the head and neck  LUNGS: on inspection no signs of respiratory distress, breathing rate appears normal, no obvious gross SOB, gasping or wheezing  CV: no obvious cyanosis  MS: moves all visible extremities without noticeable abnormality  PSYCH/NEURO: pleasant and cooperative, no obvious depression or anxiety, speech and thought processing grossly intact  ASSESSMENT AND PLAN:  Discussed the following assessment and plan:  Cough No other URI type symptoms. No reflux symptoms.  Denies other symptoms consistent with CHF exacerbation including weight change, dyspnea, orthopnea or edema.  Discussed if he does develop any of the above symptoms to  increase lasix to 40mg  and schedule appt with [place] for in office evaluation.   25 minutes spent including pre visit preparation, review of prior notes and labs, encounter with patient via video visit and same day documentation.   I discussed the assessment and treatment plan with the patient. The patient was provided an opportunity to ask questions and all were answered. The patient agreed with the plan and demonstrated an understanding of the instructions.   The patient was advised to call back or seek an in-person evaluation if the symptoms worsen or if the condition fails to improve as anticipated.    [name],  DO

## 2019-11-19 NOTE — Assessment & Plan Note (Signed)
No other URI type symptoms. No reflux symptoms.  Denies other symptoms consistent with CHF exacerbation including weight change, dyspnea, orthopnea or edema.  Discussed if he does develop any of the above symptoms to increase lasix to 40mg  and schedule appt with [place] for in office evaluation.

## 2019-11-26 ENCOUNTER — Ambulatory Visit (INDEPENDENT_AMBULATORY_CARE_PROVIDER_SITE_OTHER): Payer: Commercial Managed Care - PPO | Admitting: Family Medicine

## 2019-11-26 ENCOUNTER — Encounter: Payer: Self-pay | Admitting: Family Medicine

## 2019-11-26 VITALS — BP 122/63 | HR 79 | Ht 67.0 in | Wt 277.0 lb

## 2019-11-26 DIAGNOSIS — Z95810 Presence of automatic (implantable) cardiac defibrillator: Secondary | ICD-10-CM | POA: Diagnosis not present

## 2019-11-26 DIAGNOSIS — E119 Type 2 diabetes mellitus without complications: Secondary | ICD-10-CM

## 2019-11-26 DIAGNOSIS — I5022 Chronic systolic (congestive) heart failure: Secondary | ICD-10-CM

## 2019-11-26 DIAGNOSIS — R059 Cough, unspecified: Secondary | ICD-10-CM

## 2019-11-26 DIAGNOSIS — R05 Cough: Secondary | ICD-10-CM

## 2019-11-26 DIAGNOSIS — Z950 Presence of cardiac pacemaker: Secondary | ICD-10-CM | POA: Diagnosis not present

## 2019-11-26 LAB — BASIC METABOLIC PANEL WITH GFR
BUN: 11 mg/dL (ref 7–25)
CO2: 30 mmol/L (ref 20–32)
Calcium: 9.8 mg/dL (ref 8.6–10.3)
Chloride: 102 mmol/L (ref 98–110)
Creat: 0.82 mg/dL (ref 0.70–1.33)
GFR, Est African American: 115 mL/min/{1.73_m2} (ref >=60)
GFR, Est Non African American: 99 mL/min/{1.73_m2} (ref >=60)
Glucose, Bld: 200 mg/dL — ABNORMAL HIGH (ref 65–99)
Potassium: 4.9 mmol/L (ref 3.5–5.3)
Sodium: 140 mmol/L (ref 135–146)

## 2019-11-26 LAB — POCT GLYCOSYLATED HEMOGLOBIN (HGB A1C): Hemoglobin A1C: 6.7 % — AB (ref 4.0–5.6)

## 2019-11-26 NOTE — Assessment & Plan Note (Signed)
Week status post placement and doing well.  The incision looks great on exam.  He will not be able to do full range of motion with that shoulder for at least another week he has been having some pain into that shoulder but was having problems with it even before his surgery he is hoping that once he can start really working it out but will help and if not discussed that we could certainly get him in with Dr. Dianah Field for further evaluation.

## 2019-11-26 NOTE — Progress Notes (Signed)
Established Patient Office Visit  Subjective:  Patient ID: Frederick Yang, male    DOB: Dec 18, 1960  Age: 59 y.o. MRN: [id number]  CC:  Chief Complaint  Patient presents with  . Diabetes    HPI Frederick Yang presents for Diabetes - no hypoglycemic events. No wounds or sores that are not healing well. No increased thirst or urination. Checking glucose at home.  Blood sugars mostly running from 1 20-1 50.  He really has made some changes to his diet but has not been able to start exercising consistently again.  He still gets short of breath at times with activity.  Taking medications as prescribed without any side effects.  He has also had a pacemaker placed since last here for non-ischemic cardiomyopathy.   Also complains of a cough for 1 week.  No fever chills sweats.  No sinus symptoms with it.  He says it has become somewhat productive but occasionally it is frothy as well.  He says it seems to be worse at night but he does not feel bad.  He has noticed that his weight is gone up about 3 pounds slowly over the last week.  He has been taking his furosemide daily no recent changes.  Past Medical History:  Diagnosis Date  . Congestive heart failure, NYHA class 3 ([place]) 06/29/2019  . Diabetes mellitus without complication ([place])   . GERD (gastroesophageal reflux disease)   . Hepatic steatosis   . Hyperlipidemia   . Prostate hypertrophy     Past Surgical History:  Procedure Laterality Date  . APPENDECTOMY    . COLON RESECTION SIGMOID  11/2015  . HEMORRHOID BANDING  06/24/13   Dr. Ludwig Lean  . HEMORRHOID BANDING  07/12/2013  . HERNIA REPAIR     RT groin  . right shoulder surgery  03/06/2014   Dr. Tamera Punt   . TREATMENT FISTULA ANAL      Family History  Problem Relation Age of Onset  . Breast cancer Mother 22  . Uterine cancer Mother   . Alcohol abuse Maternal Uncle   . Heart attack Maternal Uncle   . Stroke Maternal Uncle   . Lung cancer Maternal Grandfather   . Diabetes  Maternal Aunt   . Hypertension Brother   . Hypertension Sister   . Diabetes Sister     Social History   Socioeconomic History  . Marital status: Married    Spouse name: Not on file  . Number of children: 3   . Years of education: Not on file  . Highest education level: Not on file  Occupational History    Employer: [employer]  Tobacco Use  . Smoking status: Former Smoker    Types: Cigarettes    Quit date: 06/27/1988    Years since quitting: 31.4  . Smokeless tobacco: Never Used  Substance and Sexual Activity  . Alcohol use: Yes  . Drug use: No  . Sexual activity: Not on file  Other Topics Concern  . Not on file  Social History Narrative   Started some exercise. Occ caffeine but not daily.    Social Determinants of Health   Financial Resource Strain:   . Difficulty of Paying Living Expenses:   Food Insecurity:   . Worried About Charity fundraiser in the Last Year:   . Arboriculturist in the Last Year:   Transportation Needs:   . Film/video editor (Medical):   Marland Kitchen Lack of Transportation (Non-Medical):   Physical  Activity:   . Days of Exercise per Week:   . Minutes of Exercise per Session:   Stress:   . Feeling of Stress :   Social Connections:   . Frequency of Communication with Friends and Family:   . Frequency of Social Gatherings with Friends and Family:   . Attends Religious Services:   . Active Member of Clubs or Organizations:   . Attends Archivist Meetings:   Marland Kitchen Marital Status:   Intimate Partner Violence:   . Fear of Current or Ex-Partner:   . Emotionally Abused:   Marland Kitchen Physically Abused:   . Sexually Abused:     Outpatient Medications Prior to Visit  Medication Sig Dispense Refill  . atorvastatin (LIPITOR) 20 MG tablet TAKE 1 TABLET BY MOUTH EVERYDAY AT BEDTIME 90 tablet 3  . carvedilol (COREG) 6.25 MG tablet Take 1 tablet by mouth 2 (two) times daily.    Steffanie Dunn 5 MG TABS tablet Take 5 mg by mouth 2 (two) times daily.  11  .  Dexlansoprazole (DEXILANT) 30 MG capsule Take 1 capsule (30 mg total) by mouth daily. 90 capsule 1  . fluticasone (FLONASE) 50 MCG/ACT nasal spray SPRAY 2 SPRAYS INTO EACH NOSTRIL EVERY DAY 48 mL 1  . furosemide (LASIX) 20 MG tablet TAKE 1 TABLET BY MOUTH EVERY DAY 90 tablet 1  . sacubitril-valsartan (ENTRESTO) 24-26 MG Take 1 tablet by mouth 2 (two) times daily.    Marland Kitchen XIGDUO XR 10-500 MG TB24 TAKE 1 TABLET BY MOUTH EVERY DAY 30 tablet 4   No facility-administered medications prior to visit.    No Known Allergies  ROS Review of Systems    Objective:    Physical Exam  Constitutional: He is oriented to person, place, and time. He appears well-developed and well-nourished.  HENT:  Head: Normocephalic and atraumatic.  Right Ear: External ear normal.  Left Ear: External ear normal.  Eyes: Conjunctivae are normal.  Cardiovascular: Normal rate, regular rhythm and normal heart sounds.  Pulmonary/Chest: Effort normal and breath sounds normal.  Crackles at the left lung base.  Musculoskeletal:        General: No edema.  Neurological: He is alert and oriented to person, place, and time.  Skin: Skin is warm and dry.  Psychiatric: He has a normal mood and affect. His behavior is normal.    BP 122/63   Pulse 79   Ht 5\' 7"  (1.702 m)   Wt 277 lb (125.6 kg)   SpO2 99%   BMI 43.38 kg/m  Wt Readings from Last 3 Encounters:  11/26/19 277 lb (125.6 kg)  08/27/19 270 lb (122.5 kg)  07/12/19 268 lb (121.6 kg)     Health Maintenance Due  Topic Date Due  . OPHTHALMOLOGY EXAM  08/25/2019    There are no preventive care reminders to display for this patient.  Lab Results  Component Value Date   TSH 1.93 07/05/2019   Lab Results  Component Value Date   WBC 6.0 07/05/2019   HGB 18.0 (H) 07/05/2019   HCT 54.2 (H) 07/05/2019   MCV 86.4 07/05/2019   PLT 224 07/05/2019   Lab Results  Component Value Date   NA 136 07/12/2019   K 5.0 07/12/2019   CO2 27 07/12/2019   GLUCOSE 104  (H) 07/12/2019   BUN 14 07/12/2019   CREATININE 0.96 07/12/2019   BILITOT 0.8 07/05/2019   ALKPHOS 66 02/07/2017   AST 18 07/05/2019   ALT 16 07/05/2019   PROT  7.6 07/05/2019   ALBUMIN 3.9 02/07/2017   CALCIUM 9.9 07/12/2019   ANIONGAP 7 06/06/2015   Lab Results  Component Value Date   CHOL 208 (H) 05/08/2019   Lab Results  Component Value Date   HDL 35 (L) 05/08/2019   Lab Results  Component Value Date   LDLCALC 142 (H) 05/08/2019   Lab Results  Component Value Date   TRIG 176 (H) 05/08/2019   Lab Results  Component Value Date   CHOLHDL 5.9 (H) 05/08/2019   Lab Results  Component Value Date   HGBA1C 6.7 (A) 11/26/2019      Assessment & Plan:   Problem List Items Addressed This Visit      Cardiovascular and Mediastinum   Chronic systolic congestive heart failure ([place])    Actually did hear some crackles at the left lung base which could be a sign of volume overload which could be triggering his recent increase in cough over the last week.  We will try increasing his furosemide to 2 tabs daily for a couple of days to see if this helps.      Relevant Orders   BASIC METABOLIC PANEL WITH GFR     Endocrine   Diabetes mellitus without complication ([place]) - Primary    Improved. A1C is now 6.7. great job in working [employer]. F/u in 3-4 weeks.  Due to recheck renal function.  Hopefully will be able to start exercising more consistently though he has been trying to stay active.  This will help bring that his A1c down even more.      Relevant Orders   POCT glycosylated hemoglobin (Hb A1C) (Completed)   BASIC METABOLIC PANEL WITH GFR     Other   S/P implantation of automatic cardioverter/defibrillator (AICD)    Week status post placement and doing well.  The incision looks great on exam.  He will not be able to do full range of motion with that shoulder for at least another week he has been having some pain into that shoulder but was having problems with it even before  his surgery he is hoping that once he can start really working it out but will help and if not discussed that we could certainly get him in with Dr. Dianah Field for further evaluation.      RESOLVED: S/P cardiac pacemaker procedure    Other Visit Diagnoses    Cough         Cough-suspect fluid overload.  Recommend increasing Lasix to 2 tabs daily for couple days to see if it improves if it does not then please let me know.  We will try to get the extra 3 pounds down.  No orders of the defined types were placed in this encounter.   Follow-up: Return in about 3 months (around 02/26/2020) for Diabetes follow-up.    Beatrice Lecher, MD

## 2019-11-26 NOTE — Assessment & Plan Note (Addendum)
Improved. A1C is now 6.7. great job in working [employer]. F/u in 3-4 weeks.  Due to recheck renal function.  Hopefully will be able to start exercising more consistently though he has been trying to stay active.  This will help bring that his A1c down even more.

## 2019-11-26 NOTE — Assessment & Plan Note (Signed)
Actually did hear some crackles at the left lung base which could be a sign of volume overload which could be triggering his recent increase in cough over the last week.  We will try increasing his furosemide to 2 tabs daily for a couple of days to see if this helps.

## 2019-11-27 NOTE — Progress Notes (Signed)
All labs are normal. 

## 2019-12-12 ENCOUNTER — Other Ambulatory Visit: Payer: Self-pay | Admitting: Family Medicine

## 2019-12-23 ENCOUNTER — Other Ambulatory Visit: Payer: Self-pay | Admitting: Family Medicine

## 2019-12-27 ENCOUNTER — Encounter: Payer: Self-pay | Admitting: Family Medicine

## 2020-01-09 ENCOUNTER — Ambulatory Visit: Payer: Commercial Managed Care - PPO | Admitting: Family Medicine

## 2020-01-14 ENCOUNTER — Other Ambulatory Visit: Payer: Self-pay

## 2020-01-14 ENCOUNTER — Ambulatory Visit (INDEPENDENT_AMBULATORY_CARE_PROVIDER_SITE_OTHER): Payer: Commercial Managed Care - PPO | Admitting: Sports Medicine

## 2020-01-14 DIAGNOSIS — M7542 Impingement syndrome of left shoulder: Secondary | ICD-10-CM

## 2020-01-14 NOTE — Progress Notes (Signed)
    Procedures performed today:    None.  Independent interpretation of notes and tests performed by another provider:   None.  Brief History, Exam, Impression, and Recommendations:    Frederick Yang is a pleasant 59yo male who presents with complaints of left shoulder pain that started occurring after immobilization of the shoulder following defibrillator placement for CHF. The pain is irritated by motion and has not improved. He has signs of impingement syndrome on exam including a positive Hawkins and impingement sign indicating his pain is most likely due to impingement syndrome. We are going to start him on PT for 6 weeks with follow up for reevaluation and possible injection if still painful.  Marcelino Duster, MS3   ___________________________________________ Gwen Her. Dianah Field, M.D., ABFM., CAQSM. Primary Care and [place] Instructor of [place] of [facility]

## 2020-01-14 NOTE — Assessment & Plan Note (Signed)
Frederick Yang is a pleasant 59 year old male, he has systolic heart failure and recently had an implantable cardioverter/defibrillator, he had a period of decreased mobility of his left shoulder, ultimately he has developed pain over the lateral shoulder, worse with abduction. He has impingement signs on exam, we discussed the function of the rotator cuff, its force coupling, and the need for physical therapy. We will do 6 weeks of PT, return to see me at that point and we will do an injection if no better.

## 2020-01-23 ENCOUNTER — Ambulatory Visit (INDEPENDENT_AMBULATORY_CARE_PROVIDER_SITE_OTHER): Payer: Commercial Managed Care - PPO | Admitting: Sports Medicine

## 2020-01-23 ENCOUNTER — Other Ambulatory Visit: Payer: Self-pay

## 2020-01-23 ENCOUNTER — Encounter: Payer: Self-pay | Admitting: Sports Medicine

## 2020-01-23 DIAGNOSIS — M7542 Impingement syndrome of left shoulder: Secondary | ICD-10-CM | POA: Diagnosis not present

## 2020-01-23 NOTE — Progress Notes (Signed)
° ° °  Procedures performed today:    None.  Independent interpretation of notes and tests performed by another provider:   None.  Brief History, Exam, Impression, and Recommendations:    Impingement syndrome, shoulder, left This is a very pleasant 59 year old male, he has a history of systolic heart failure and recently had an implantable cardioverter/defibrillator, he had a period of immobilization for his left shoulder, ultimately developed pain, worse with abduction, at the last visit he had impingement signs on exam, we placed him to physical therapy, he is done a couple of sessions, his follow-up with me is on 31 August, and we will give PT the due diligence of sufficient sessions before considering an injection. I did fill out some disability paperwork today for his shoulder, he will need additional disability paperwork filled out by his cardiologist/PCP regarding his systolic heart failure as part of his long-term disability claim.    ___________________________________________ Gwen Her. Dianah Field, M.D., ABFM., CAQSM. Primary Care and [place] Instructor of [place] of [facility]

## 2020-01-23 NOTE — Assessment & Plan Note (Signed)
This is a very pleasant 58 year old male, he has a history of systolic heart failure and recently had an implantable cardioverter/defibrillator, he had a period of immobilization for his left shoulder, ultimately developed pain, worse with abduction, at the last visit he had impingement signs on exam, we placed him to physical therapy, he is done a couple of sessions, his follow-up with me is on 31 August, and we will give PT the due diligence of sufficient sessions before considering an injection. I did fill out some disability paperwork today for his shoulder, he will need additional disability paperwork filled out by his cardiologist/PCP regarding his systolic heart failure as part of his long-term disability claim.

## 2020-02-13 ENCOUNTER — Telehealth: Payer: Self-pay

## 2020-02-13 NOTE — Telephone Encounter (Signed)
Lino left a message asking if the disability paperwork has been sent.

## 2020-02-16 ENCOUNTER — Other Ambulatory Visit: Payer: Self-pay | Admitting: Family Medicine

## 2020-02-17 NOTE — Telephone Encounter (Signed)
Called and lvm advised pt that I have not seen any forms for his FMLA for him and these may have gone to his cardiologist office. However, if this is something that we need to do for him he can have this forms faxed to [place] and left the main fax # [phone]

## 2020-02-25 ENCOUNTER — Other Ambulatory Visit: Payer: Self-pay

## 2020-02-25 ENCOUNTER — Ambulatory Visit (INDEPENDENT_AMBULATORY_CARE_PROVIDER_SITE_OTHER): Payer: Commercial Managed Care - PPO

## 2020-02-25 ENCOUNTER — Ambulatory Visit (INDEPENDENT_AMBULATORY_CARE_PROVIDER_SITE_OTHER): Payer: Commercial Managed Care - PPO | Admitting: Sports Medicine

## 2020-02-25 ENCOUNTER — Encounter: Payer: Self-pay | Admitting: Family Medicine

## 2020-02-25 ENCOUNTER — Ambulatory Visit (INDEPENDENT_AMBULATORY_CARE_PROVIDER_SITE_OTHER): Payer: Commercial Managed Care - PPO | Admitting: Family Medicine

## 2020-02-25 VITALS — BP 112/64 | HR 78 | Ht 67.0 in | Wt 272.0 lb

## 2020-02-25 DIAGNOSIS — I5022 Chronic systolic (congestive) heart failure: Secondary | ICD-10-CM

## 2020-02-25 DIAGNOSIS — E119 Type 2 diabetes mellitus without complications: Secondary | ICD-10-CM | POA: Diagnosis not present

## 2020-02-25 DIAGNOSIS — R5383 Other fatigue: Secondary | ICD-10-CM

## 2020-02-25 DIAGNOSIS — M7542 Impingement syndrome of left shoulder: Secondary | ICD-10-CM

## 2020-02-25 LAB — POCT GLYCOSYLATED HEMOGLOBIN (HGB A1C): Hemoglobin A1C: 7 % — AB (ref 4.0–5.6)

## 2020-02-25 MED ORDER — XIGDUO XR 10-500 MG PO TB24
1.0000 | ORAL_TABLET | Freq: Every day | ORAL | 1 refills | Status: DC
Start: 2020-02-25 — End: 2020-06-02

## 2020-02-25 NOTE — Assessment & Plan Note (Signed)
Like overall he has been fairly stable on 60 mg furosemide regimen.  But he still reports that there are days where his weight will fluctuate 2 to 3 pounds.  We discussed looking for sources of sodium that might not be readily apparent.  And also continuing to work on limiting fluid intake he says he tries to drink no more than 3 quarts per day but usually sticks around 2.  He reports that his most recent EF was up to 40%.  Increase his carvedilol to 12.5 mg.

## 2020-02-25 NOTE — Progress Notes (Signed)
He reports that his BS have been gradually increasing over the past 2 months. His AM readings have been in the 190-high 200's

## 2020-02-25 NOTE — Assessment & Plan Note (Signed)
Hemoglobin A1c up to 7.0 today with recent elevation of blood glucose levels over the last 2 months.  He does not follow through any major dietary changes we discussed options including adding something like Ozempic or Victoza to help reduce his glucose levels.  He says he really wants at least a period of time to really focus on his diet and try to make some changes as well as starting to walk again.  He actually is gradually started walking again and had gotten up to about 1-1/2 miles but then started to have one of the episodes where he felt really fatigued and he backed off wondering if the walking was actually exacerbating his symptoms.  We will give him 4 weeks to work on this and make some changes and see if we can get his blood glucose levels under control.  If not then consider a trial of Ozempic.

## 2020-02-25 NOTE — Assessment & Plan Note (Signed)
This is a very pleasant 59 year old male with left shoulder impingement syndrome, he has done formal physical therapy without improvement, today we did a subacromial injection, return to see me in a month.

## 2020-02-25 NOTE — Progress Notes (Signed)
Established Patient Office Visit  Subjective:  Patient ID: Frederick Yang, male    DOB: [birth year]  Age: 59 y.o. MRN: [id number]  CC:  Chief Complaint  Patient presents with  . Diabetes    HPI [name] presents for elevated blood glucose levels.  Normally blood sugars are well controlled running between 110 and 140 but were more recently they have been running much higher close to 200.  5 pounds since we saw him in June.  He says been going on for about 2 months and he really has not had any dietary changes.  He also reports he is just been noticing increased fatigue.  Especially with being out and being tried to be active not anything particularly strenuous per se but just trying to be active he will just have at least 1 or 2 days a week where he just feels exhausted.  He has noted though that sometimes when his weight goes up pretty quickly, thus retaining fluid, it seems to correlate with some of the fatigue but not always.  He has had some occasional chest discomfort mostly at night when he lies down but he can usually change position and get it to go away.  Since having his pacemaker placed he has been following with Dr. Zenia Resides, cardiology they recently increased his Coreg to 12.5 mg twice a day and check a BMP and a BNP.  He is not sure if this may have affected his blood glucose levels.  Past Medical History:  Diagnosis Date  . Congestive heart failure, NYHA class 3 ([place]) 06/29/2019  . Diabetes mellitus without complication ([place])   . GERD (gastroesophageal reflux disease)   . Hepatic steatosis   . Hyperlipidemia   . Prostate hypertrophy     Past Surgical History:  Procedure Laterality Date  . APPENDECTOMY    . COLON RESECTION SIGMOID  11/2015  . HEMORRHOID BANDING  06/24/13   Dr. Ludwig Lean  . HEMORRHOID BANDING  07/12/2013  . HERNIA REPAIR     RT groin  . right shoulder surgery  03/06/2014   Dr. Tamera Punt   . TREATMENT FISTULA ANAL      Family History   Problem Relation Age of Onset  . Breast cancer Mother 101  . Uterine cancer Mother   . Alcohol abuse Maternal Uncle   . Heart attack Maternal Uncle   . Stroke Maternal Uncle   . Lung cancer Maternal Grandfather   . Diabetes Maternal Aunt   . Hypertension Brother   . Hypertension Sister   . Diabetes Sister     Social History   Socioeconomic History  . Marital status: Married    Spouse name: Not on file  . Number of children: 3   . Years of education: Not on file  . Highest education level: Not on file  Occupational History    Employer: [employer]  Tobacco Use  . Smoking status: Former Smoker    Types: Cigarettes    Quit date: 06/27/1988    Years since quitting: 31.6  . Smokeless tobacco: Never Used  Substance and Sexual Activity  . Alcohol use: Yes  . Drug use: No  . Sexual activity: Not on file  Other Topics Concern  . Not on file  Social History Narrative   Started some exercise. Occ caffeine but not daily.    Social Determinants of Health   Financial Resource Strain:   . Difficulty of Paying Living Expenses: Not on file  Food Insecurity:   .  Worried About Charity fundraiser in the Last Year: Not on file  . Ran Out of Food in the Last Year: Not on file  Transportation Needs:   . Lack of Transportation (Medical): Not on file  . Lack of Transportation (Non-Medical): Not on file  Physical Activity:   . Days of Exercise per Week: Not on file  . Minutes of Exercise per Session: Not on file  Stress:   . Feeling of Stress : Not on file  Social Connections:   . Frequency of Communication with Friends and Family: Not on file  . Frequency of Social Gatherings with Friends and Family: Not on file  . Attends Religious Services: Not on file  . Active Member of Clubs or Organizations: Not on file  . Attends Archivist Meetings: Not on file  . Marital Status: Not on file  Intimate Partner Violence:   . Fear of Current or Ex-Partner: Not on file  .  Emotionally Abused: Not on file  . Physically Abused: Not on file  . Sexually Abused: Not on file    Outpatient Medications Prior to Visit  Medication Sig Dispense Refill  . atorvastatin (LIPITOR) 20 MG tablet TAKE 1 TABLET BY MOUTH EVERYDAY AT BEDTIME 90 tablet 3  . carvedilol (COREG) 12.5 MG tablet Take 12.5 mg by mouth 2 (two) times daily with a meal.    . CORLANOR 5 MG TABS tablet Take 5 mg by mouth 2 (two) times daily.  11  . DEXILANT 30 MG capsule TAKE 1 CAPSULE BY MOUTH EVERY DAY 30 capsule 1  . fluticasone (FLONASE) 50 MCG/ACT nasal spray SPRAY 2 SPRAYS INTO EACH NOSTRIL EVERY DAY 48 mL 1  . furosemide (LASIX) 40 MG tablet Take 60 mg by mouth daily.    . sacubitril-valsartan (ENTRESTO) 24-26 MG Take 1 tablet by mouth 2 (two) times daily.    . carvedilol (COREG) 6.25 MG tablet Take 6.25 mg by mouth 2 (two) times daily with a meal. Take 12.5mg  at bedtime    . XIGDUO XR 10-500 MG TB24 TAKE 1 TABLET BY MOUTH EVERY DAY 30 tablet 0  . furosemide (LASIX) 20 MG tablet Take 60 mg by mouth.     No facility-administered medications prior to visit.    No Known Allergies  ROS Review of Systems    Objective:    Physical Exam Constitutional:      Appearance: He is well-developed.  HENT:     Head: Normocephalic and atraumatic.  Cardiovascular:     Rate and Rhythm: Normal rate and regular rhythm.     Heart sounds: Normal heart sounds.  Pulmonary:     Effort: Pulmonary effort is normal.     Breath sounds: Normal breath sounds.  Musculoskeletal:     Comments: No lower extremity edema.  Skin:    General: Skin is warm and dry.  Neurological:     Mental Status: He is alert and oriented to person, place, and time.  Psychiatric:        Behavior: Behavior normal.     BP 112/64   Pulse 78   Ht 5\' 7"  (1.702 m)   Wt 272 lb (123.4 kg)   SpO2 98%   BMI 42.60 kg/m  Wt Readings from Last 3 Encounters:  02/25/20 272 lb (123.4 kg)  11/26/19 277 lb (125.6 kg)  08/27/19 270 lb (122.5  kg)     There are no preventive care reminders to display for this patient.  There are no  preventive care reminders to display for this patient.  Lab Results  Component Value Date   TSH 1.93 07/05/2019   Lab Results  Component Value Date   WBC 6.0 07/05/2019   HGB 18.0 (H) 07/05/2019   HCT 54.2 (H) 07/05/2019   MCV 86.4 07/05/2019   PLT 224 07/05/2019   Lab Results  Component Value Date   NA 140 11/26/2019   K 4.9 11/26/2019   CO2 30 11/26/2019   GLUCOSE 200 (H) 11/26/2019   BUN 11 11/26/2019   CREATININE 0.82 11/26/2019   BILITOT 0.8 07/05/2019   ALKPHOS 66 02/07/2017   AST 18 07/05/2019   ALT 16 07/05/2019   PROT 7.6 07/05/2019   ALBUMIN 3.9 02/07/2017   CALCIUM 9.8 11/26/2019   ANIONGAP 7 06/06/2015   Lab Results  Component Value Date   CHOL 208 (H) 05/08/2019   Lab Results  Component Value Date   HDL 35 (L) 05/08/2019   Lab Results  Component Value Date   LDLCALC 142 (H) 05/08/2019   Lab Results  Component Value Date   TRIG 176 (H) 05/08/2019   Lab Results  Component Value Date   CHOLHDL 5.9 (H) 05/08/2019   Lab Results  Component Value Date   HGBA1C 7.0 (A) 02/25/2020      Assessment & Plan:   Problem List Items Addressed This Visit      Cardiovascular and Mediastinum   Chronic systolic congestive heart failure ([place])    Like overall he has been fairly stable on 60 mg furosemide regimen.  But he still reports that there are days where his weight will fluctuate 2 to 3 pounds.  We discussed looking for sources of sodium that might not be readily apparent.  And also continuing to work on limiting fluid intake he says he tries to drink no more than 3 quarts per day but usually sticks around 2.  He reports that his most recent EF was up to 40%.  Increase his carvedilol to 12.5 mg.      Relevant Medications   furosemide (LASIX) 40 MG tablet   carvedilol (COREG) 12.5 MG tablet     Endocrine   Diabetes mellitus without complication (HCC) -  Primary    Hemoglobin A1c up to 7.0 today with recent elevation of blood glucose levels over the last 2 months.  He does not follow through any major dietary changes we discussed options including adding something like Ozempic or Victoza to help reduce his glucose levels.  He says he really wants at least a period of time to really focus on his diet and try to make some changes as well as starting to walk again.  He actually is gradually started walking again and had gotten up to about 1-1/2 miles but then started to have one of the episodes where he felt really fatigued and he backed off wondering if the walking was actually exacerbating his symptoms.  We will give him 4 weeks to work on this and make some changes and see if we can get his blood glucose levels under control.  If not then consider a trial of Ozempic.      Relevant Medications   Dapagliflozin-metFORMIN HCl ER (XIGDUO XR) 10-500 MG TB24   Other Relevant Orders   POCT glycosylated hemoglobin (Hb A1C) (Completed)   Hepatic function panel   TSH   CBC with Differential/Platelet   Fe+TIBC+Fer    Other Visit Diagnoses    Fatigue, unspecified type  Relevant Orders   Hepatic function panel   TSH   CBC with Differential/Platelet   Fe+TIBC+Fer      Fatigue-unclear etiology at this point.  If anything his heart function is actually improving.  Though his blood sugars have not been as well controlled but they are not completely out of control.  We will check liver function since he recently had a normal BMP.  Also check for anemia, and check for thyroid disorder.  Will call with results once available he says he has not noticed any additional symptoms including fever or night sweats, rash etc.  Meds ordered this encounter  Medications  . Dapagliflozin-metFORMIN HCl ER (XIGDUO XR) 10-500 MG TB24    Sig: Take 1 tablet by mouth daily.    Dispense:  90 tablet    Refill:  1    Follow-up: Return in about 3 months (around  05/26/2020) for Diabetes follow-up.    Beatrice Lecher, MD

## 2020-02-25 NOTE — Progress Notes (Signed)
    Procedures performed today:    Procedure: Real-time Ultrasound Guided injection of the left subacromial bursa Device: Samsung HS60  Verbal informed consent obtained.  Time-out conducted.  Noted no overlying erythema, induration, or other signs of local infection.  Skin prepped in a sterile fashion.  Local anesthesia: Topical Ethyl chloride.  With sterile technique and under real time ultrasound guidance: 1 cc Kenalog 40, 2 cc lidocaine, 2 cc bupivacaine injected easily Completed without difficulty  Pain immediately resolved suggesting accurate placement of the medication.  Advised to call if fevers/chills, erythema, induration, drainage, or persistent bleeding.  Images permanently stored and available for review in the ultrasound unit.  Impression: Technically successful ultrasound guided injection.  ___________________________________________ Gwen Her. Dianah Field, M.D., ABFM., CAQSM. Primary Care and [place] Instructor of [place] of [facility] of Medicine  Independent interpretation of notes and tests performed by another provider:   None.  Brief History, Exam, Impression, and Recommendations:    Impingement syndrome, shoulder, left This is a very pleasant 59 year old male with left shoulder impingement syndrome, he has done formal physical therapy without improvement, today we did a subacromial injection, return to see me in a month.    ___________________________________________ Gwen Her. Dianah Field, M.D., ABFM., CAQSM. Primary Care and [place] Instructor of [place] of [facility] of Medicine

## 2020-02-26 LAB — CBC WITH DIFFERENTIAL/PLATELET
Absolute Monocytes: 626 cells/uL (ref 200–950)
Basophils Absolute: 62 cells/uL (ref 0–200)
Basophils Relative: 1 %
Eosinophils Absolute: 143 cells/uL (ref 15–500)
Eosinophils Relative: 2.3 %
HCT: 47.8 % (ref 38.5–50.0)
Hemoglobin: 16 g/dL (ref 13.2–17.1)
Lymphs Abs: 2096 cells/uL (ref 850–3900)
MCH: 29.1 pg (ref 27.0–33.0)
MCHC: 33.5 g/dL (ref 32.0–36.0)
MCV: 86.9 fL (ref 80.0–100.0)
MPV: 11.8 fL (ref 7.5–12.5)
Monocytes Relative: 10.1 %
Neutro Abs: 3274 cells/uL (ref 1500–7800)
Neutrophils Relative %: 52.8 %
Platelets: 195 10*3/uL (ref 140–400)
RBC: 5.5 10*6/uL (ref 4.20–5.80)
RDW: 12.4 % (ref 11.0–15.0)
Total Lymphocyte: 33.8 %
WBC: 6.2 10*3/uL (ref 3.8–10.8)

## 2020-02-26 LAB — HEPATIC FUNCTION PANEL
AG Ratio: 1.3 (calc) (ref 1.0–2.5)
ALT: 12 U/L (ref 9–46)
AST: 12 U/L (ref 10–35)
Albumin: 4.3 g/dL (ref 3.6–5.1)
Alkaline phosphatase (APISO): 87 U/L (ref 35–144)
Bilirubin, Direct: 0.1 mg/dL (ref 0.0–0.2)
Globulin: 3.3 g/dL (calc) (ref 1.9–3.7)
Indirect Bilirubin: 0.3 mg/dL (calc) (ref 0.2–1.2)
Total Bilirubin: 0.4 mg/dL (ref 0.2–1.2)
Total Protein: 7.6 g/dL (ref 6.1–8.1)

## 2020-02-26 LAB — IRON,TIBC AND FERRITIN PANEL
%SAT: 19 % (calc) — ABNORMAL LOW (ref 20–48)
Ferritin: 103 ng/mL (ref 38–380)
Iron: 55 ug/dL (ref 50–180)
TIBC: 290 mcg/dL (calc) (ref 250–425)

## 2020-02-26 LAB — TSH: TSH: 0.8 mIU/L (ref 0.40–4.50)

## 2020-03-12 ENCOUNTER — Other Ambulatory Visit: Payer: Self-pay | Admitting: Family Medicine

## 2020-03-17 ENCOUNTER — Other Ambulatory Visit: Payer: Self-pay | Admitting: Family Medicine

## 2020-03-19 ENCOUNTER — Telehealth: Payer: Self-pay | Admitting: *Deleted

## 2020-03-19 NOTE — Telephone Encounter (Signed)
Placed forms to be faxed and scanned into pt's placed with front office.  A records request form was also attached.

## 2020-03-24 ENCOUNTER — Ambulatory Visit: Payer: Commercial Managed Care - PPO | Admitting: Sports Medicine

## 2020-04-01 ENCOUNTER — Telehealth: Payer: Self-pay | Admitting: Sports Medicine

## 2020-04-01 NOTE — Telephone Encounter (Signed)
Updated disability paperwork filled out today, and sent for scanning and fax.

## 2020-04-09 ENCOUNTER — Other Ambulatory Visit: Payer: Self-pay

## 2020-04-09 ENCOUNTER — Ambulatory Visit (INDEPENDENT_AMBULATORY_CARE_PROVIDER_SITE_OTHER): Payer: Commercial Managed Care - PPO

## 2020-04-09 ENCOUNTER — Ambulatory Visit (INDEPENDENT_AMBULATORY_CARE_PROVIDER_SITE_OTHER): Payer: Commercial Managed Care - PPO | Admitting: Sports Medicine

## 2020-04-09 DIAGNOSIS — M7542 Impingement syndrome of left shoulder: Secondary | ICD-10-CM

## 2020-04-09 NOTE — Progress Notes (Signed)
    Procedures performed today:    Procedure: Real-time Ultrasound Guided injection of the left subacromial bursa Device: Samsung HS60  Verbal informed consent obtained.  Time-out conducted.  Noted no overlying erythema, induration, or other signs of local infection.  Skin prepped in a sterile fashion.  Local anesthesia: Topical Ethyl chloride.  With sterile technique and under real time ultrasound guidance: 1 cc Kenalog 40, 1 cc lidocaine, 1 cc bupivacaine injected easily Completed without difficulty  Pain immediately resolved suggesting accurate placement of the medication.  Advised to call if fevers/chills, erythema, induration, drainage, or persistent bleeding.  Images permanently stored and available for review in PACS.  Impression: Technically successful ultrasound guided injection.  Independent interpretation of notes and tests performed by another provider:   None.  Brief History, Exam, Impression, and Recommendations:    Impingement syndrome, shoulder, left Frederick Yang, he is a very pleasant 59 year old male with left shoulder impingement syndrome, he had finished physical therapy without much improvement, continues in therapy, at the last visit we did a subacromial injection, approximately 6 weeks ago. He had good initial improvement for a month, and still has some partial improvement but does have some discomfort with extreme abduction. We repeated the subacromial injection today with the understanding that the follow-up would be MRI and arthroscopy if not sufficiently better. Of note he is profoundly claustrophobic and would need triazolam sedation, he also has a ICD that we would need to determine is MRI safe first.    ___________________________________________ Gwen Her. Dianah Field, M.D., ABFM., CAQSM. Primary Care and [place] Instructor of [place] of [facility] of Medicine

## 2020-04-09 NOTE — Assessment & Plan Note (Signed)
Frederick Yang returns, he is a very pleasant 59 year old male with left shoulder impingement syndrome, he had finished physical therapy without much improvement, continues in therapy, at the last visit we did a subacromial injection, approximately 6 weeks ago. He had good initial improvement for a month, and still has some partial improvement but does have some discomfort with extreme abduction. We repeated the subacromial injection today with the understanding that the follow-up would be MRI and arthroscopy if not sufficiently better. Of note he is profoundly claustrophobic and would need triazolam sedation, he also has a ICD that we would need to determine is MRI safe first.

## 2020-05-19 ENCOUNTER — Other Ambulatory Visit: Payer: Self-pay | Admitting: Family Medicine

## 2020-05-27 ENCOUNTER — Telehealth: Payer: Self-pay | Admitting: Family Medicine

## 2020-05-27 NOTE — Telephone Encounter (Signed)
Patient had forms with a sticky note to schedule appt and place paperwork in provider box, appt has been scheduled on 06/04/2020, and paperwork placed back in provider box. Am

## 2020-06-02 ENCOUNTER — Encounter: Payer: Self-pay | Admitting: Family Medicine

## 2020-06-02 ENCOUNTER — Other Ambulatory Visit: Payer: Self-pay

## 2020-06-02 ENCOUNTER — Ambulatory Visit (INDEPENDENT_AMBULATORY_CARE_PROVIDER_SITE_OTHER): Payer: Commercial Managed Care - PPO | Admitting: Family Medicine

## 2020-06-02 VITALS — BP 123/60 | HR 71 | Ht 67.0 in | Wt 252.0 lb

## 2020-06-02 DIAGNOSIS — E119 Type 2 diabetes mellitus without complications: Secondary | ICD-10-CM

## 2020-06-02 DIAGNOSIS — E785 Hyperlipidemia, unspecified: Secondary | ICD-10-CM

## 2020-06-02 DIAGNOSIS — I509 Heart failure, unspecified: Secondary | ICD-10-CM

## 2020-06-02 DIAGNOSIS — Z6839 Body mass index (BMI) 39.0-39.9, adult: Secondary | ICD-10-CM | POA: Diagnosis not present

## 2020-06-02 DIAGNOSIS — Z23 Encounter for immunization: Secondary | ICD-10-CM

## 2020-06-02 LAB — POCT GLYCOSYLATED HEMOGLOBIN (HGB A1C): Hemoglobin A1C: 6.6 % — AB (ref 4.0–5.6)

## 2020-06-02 LAB — POCT UA - MICROALBUMIN
Albumin/Creatinine Ratio, Urine, POC: <30
Creatinine, POC: 100 mg/dL
Microalbumin Ur, POC: 30 mg/L

## 2020-06-02 MED ORDER — XIGDUO XR 10-500 MG PO TB24
1.0000 | ORAL_TABLET | Freq: Every day | ORAL | 1 refills | Status: DC
Start: 2020-06-02 — End: 2020-08-31

## 2020-06-02 NOTE — Assessment & Plan Note (Signed)
Continue current regimen. Did encourage him to speak with his cardiologist about the fact that his insurance will be changing and that some of his drugs may not be as well covered. I am glad to hear that he is really working on his diet as well as weight loss he is already lost about 10 pounds we will need to monitor blood pressure carefully to make sure it is not dropping too low and that his medications might need to be adjusted including his diuretic as he loses weight.

## 2020-06-02 NOTE — Assessment & Plan Note (Signed)
Due to recheck lipids. 

## 2020-06-02 NOTE — Assessment & Plan Note (Signed)
Great job with recent dietary changes and weight loss.

## 2020-06-02 NOTE — Assessment & Plan Note (Signed)
A1c looks phenomenal today at 6.6. Again great job with weight loss. The Merleen Nicely will likely not be covered under his new insurance plan but did discuss that if he is able to really well control his blood sugars we might be able to just switch back to plain Metformin which would be much more cost effective. For now we will go ahead and refill 90-day supply of Xigduo. Aloe up in 3 months.

## 2020-06-02 NOTE — Progress Notes (Signed)
Established Patient Office Visit  Subjective:  Patient ID: Frederick Yang, male    DOB: Jan 06, 1961  Age: 59 y.o. MRN: [id number]  CC:  Chief Complaint  Patient presents with  . Diabetes    HPI Frederick Yang presents for   Diabetes - no hypoglycemic events. No wounds or sores that are not healing well. No increased thirst or urination. Checking glucose at home. Taking medications as prescribed without any side effects.  He is currently out of work on long-term disability for his heart failure. 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 It will take effect in January so just wanted to let me know if some of his medications including his Merleen Nicely will no longer be covered. Of course generics will be preferred.  Follow-up heart failure-overall he is doing well he still has an occasional day where he just feels extremely fatigued and ends up sleeping a lot. It just seems to be random. He can go days and feel fine. He says he does notice it happens more often when his weight is up about a pound but he says he has been taking his diuretic very consistently. And he has not noticed any lower extremity swelling.  Recently started a new dietary program about a month ago that is mostly higher in protein and low in carbs. He says so far he is doing really well and is even noticed improvement in his blood sugars.   Past Medical History:  Diagnosis Date  . Congestive heart failure, NYHA class 3 ([place]) 06/29/2019  . Diabetes mellitus without complication ([place])   . GERD (gastroesophageal reflux disease)   . Hepatic steatosis   . Hyperlipidemia   . Prostate hypertrophy     Past Surgical History:  Procedure Laterality Date  . APPENDECTOMY    . COLON RESECTION SIGMOID  11/2015  . HEMORRHOID BANDING  06/24/13   Dr. Ludwig Lean  . HEMORRHOID BANDING  07/12/2013  . HERNIA REPAIR     RT groin  . right shoulder surgery  03/06/2014   Dr. Tamera Punt    . TREATMENT FISTULA ANAL      Family History  Problem Relation Age of Onset  . Breast cancer Mother 27  . Uterine cancer Mother   . Alcohol abuse Maternal Uncle   . Heart attack Maternal Uncle   . Stroke Maternal Uncle   . Lung cancer Maternal Grandfather   . Diabetes Maternal Aunt   . Hypertension Brother   . Hypertension Sister   . Diabetes Sister     Social History   Socioeconomic History  . Marital status: Married    Spouse name: Not on file  . Number of children: 3   . Years of education: Not on file  . Highest education level: Not on file  Occupational History    Employer: [employer]  Tobacco Use  . Smoking status: Former Smoker    Types: Cigarettes    Quit date: 06/27/1988    Years since quitting: 31.9  . Smokeless tobacco: Never Used  Substance and Sexual Activity  . Alcohol use: Yes  . Drug use: No  . Sexual activity: Not on file  Other Topics Concern  . Not on file  Social History Narrative   Started some exercise. Occ caffeine but not daily.    Social Determinants of Health   Financial Resource Strain:   . Difficulty of Paying Living Expenses: Not on file  Food Insecurity:   . Worried About Charity fundraiser in the Last Year: Not on file  . Ran Out of Food in the Last Year: Not on file  Transportation Needs:   . Lack of Transportation (Medical): Not on file  . Lack of Transportation (Non-Medical): Not on file  Physical Activity:   . Days of Exercise per Week: Not on file  . Minutes of Exercise per Session: Not on file  Stress:   . Feeling of Stress : Not on file  Social Connections:   . Frequency of Communication with Friends and Family: Not on file  . Frequency of Social Gatherings with Friends and Family: Not on file  . Attends Religious Services: Not on file  . Active Member of Clubs or Organizations: Not on file  . Attends Archivist Meetings: Not on file  . Marital Status: Not on file  Intimate Partner Violence:   . Fear  of Current or Ex-Partner: Not on file  . Emotionally Abused: Not on file  . Physically Abused: Not on file  . Sexually Abused: Not on file    Outpatient Medications Prior to Visit  Medication Sig Dispense Refill  . atorvastatin (LIPITOR) 20 MG tablet TAKE 1 TABLET BY MOUTH EVERYDAY AT BEDTIME 90 tablet 3  . carvedilol (COREG) 12.5 MG tablet Take 12.5 mg by mouth 2 (two) times daily with a meal.    . CORLANOR 5 MG TABS tablet Take 5 mg by mouth 2 (two) times daily.  11  . DEXILANT 30 MG capsule TAKE 1 CAPSULE BY MOUTH EVERY DAY 90 capsule 3  . fluticasone (FLONASE) 50 MCG/ACT nasal spray SPRAY 2 SPRAYS INTO EACH NOSTRIL EVERY DAY 48 mL 1  . furosemide (LASIX) 40 MG tablet Take 60 mg by mouth daily.    . sacubitril-valsartan (ENTRESTO) 24-26 MG Take 1 tablet by mouth 2 (two) times daily.    . Dapagliflozin-metFORMIN HCl ER (XIGDUO XR) 10-500 MG TB24 Take 1 tablet by mouth daily. 90 tablet 1   No facility-administered medications prior to visit.    No Known Allergies  ROS Review of Systems    Objective:    Physical Exam  BP 123/60   Pulse 71   Ht 5\' 7"  (1.702 m)   Wt 252 lb (114.3 kg)   SpO2 99%   BMI 39.47 kg/m  Wt Readings from Last 3 Encounters:  06/02/20 252 lb (114.3 kg)  02/25/20 272 lb (123.4 kg)  11/26/19 277 lb (125.6 kg)     There are no preventive care reminders to display for this patient.  There are no preventive care reminders to display for this patient.  Lab Results  Component Value Date   TSH 0.80 02/25/2020   Lab Results  Component Value Date   WBC 6.2 02/25/2020   HGB 16.0 02/25/2020   HCT 47.8 02/25/2020   MCV 86.9 02/25/2020   PLT 195 02/25/2020   Lab Results  Component Value Date   NA 140 11/26/2019   K 4.9 11/26/2019   CO2 30 11/26/2019   GLUCOSE 200 (H) 11/26/2019   BUN 11 11/26/2019   CREATININE 0.82 11/26/2019   BILITOT 0.4 02/25/2020   ALKPHOS 66 02/07/2017   AST 12 02/25/2020   ALT 12 02/25/2020   PROT 7.6 02/25/2020    ALBUMIN 3.9 02/07/2017   CALCIUM 9.8 11/26/2019   ANIONGAP 7 06/06/2015   Lab Results  Component Value Date   CHOL 208 (H) 05/08/2019   Lab Results  Component Value Date   HDL 35 (L) 05/08/2019   Lab Results  Component Value Date   LDLCALC 142 (H) 05/08/2019   Lab Results  Component Value Date   TRIG 176 (H) 05/08/2019   Lab Results  Component Value Date   CHOLHDL 5.9 (H) 05/08/2019   Lab Results  Component Value Date   HGBA1C 6.6 (A) 06/02/2020      Assessment & Plan:   Problem List Items Addressed This Visit      Cardiovascular and Mediastinum   Congestive heart failure, NYHA class 3 (HCC)    Continue current regimen. Did encourage him to speak with his cardiologist about the fact that his insurance will be changing and that some of his drugs may not be as well covered. I am glad to hear that he is really working on his diet as well as weight loss he is already lost about 10 pounds we will need to monitor blood pressure carefully to make sure it is not dropping too low and that his medications might need to be adjusted including his diuretic as he loses weight.      Relevant Orders   COMPLETE METABOLIC PANEL WITH GFR   Lipid Panel w/reflex Direct LDL     Endocrine   Diabetes mellitus without complication (HCC) - Primary    A1c looks phenomenal today at 6.6. Again great job with weight loss. The Merleen Nicely will likely not be covered under his new insurance plan but did discuss that if he is able to really well control his blood sugars we might be able to just switch back to plain Metformin which would be much more cost effective. For now we will go ahead and refill 90-day supply of Xigduo. Aloe up in 3 months.      Relevant Medications   Dapagliflozin-metFORMIN HCl ER (XIGDUO XR) 10-500 MG TB24   Other Relevant Orders   POCT glycosylated hemoglobin (Hb A1C) (Completed)   POCT UA - Microalbumin (Completed)   COMPLETE METABOLIC PANEL WITH GFR   Lipid Panel w/reflex  Direct LDL     Other   Hyperlipidemia    Due to recheck lipids.       BMI 39.0-39.9,adult    Great job with recent dietary changes and weight loss.        Other Visit Diagnoses    Need for tetanus, diphtheria, and acellular pertussis (Tdap) vaccine in patient of adolescent age or older       Relevant Orders   Tdap vaccine greater than or equal to 7yo IM (Completed)      Meds ordered this encounter  Medications  . Dapagliflozin-metFORMIN HCl ER (XIGDUO XR) 10-500 MG TB24    Sig: Take 1 tablet by mouth daily.    Dispense:  90 tablet    Refill:  1    Follow-up: Return in about 3 months (around 08/31/2020) for DM.   I spent 32 minutes on the day of the encounter to include pre-visit record review, face-to-face time with the patient and post visit ordering of test.   Beatrice Lecher, MD

## 2020-06-04 ENCOUNTER — Ambulatory Visit: Payer: Commercial Managed Care - PPO | Admitting: Sports Medicine

## 2020-06-04 LAB — LIPID PANEL W/REFLEX DIRECT LDL
Cholesterol: 370 mg/dL — ABNORMAL HIGH (ref <200)
HDL: 35 mg/dL — ABNORMAL LOW (ref >=40)
LDL Cholesterol (Calc): 292 mg/dL (calc) — ABNORMAL HIGH
Non-HDL Cholesterol (Calc): 335 mg/dL (calc) — ABNORMAL HIGH (ref <130)
Total CHOL/HDL Ratio: 10.6 (calc) — ABNORMAL HIGH (ref <5.0)
Triglycerides: 231 mg/dL — ABNORMAL HIGH (ref <150)

## 2020-06-04 LAB — COMPLETE METABOLIC PANEL WITH GFR
AG Ratio: 1.3 (calc) (ref 1.0–2.5)
ALT: 9 U/L (ref 9–46)
AST: 10 U/L (ref 10–35)
Albumin: 4.1 g/dL (ref 3.6–5.1)
Alkaline phosphatase (APISO): 80 U/L (ref 35–144)
BUN: 18 mg/dL (ref 7–25)
CO2: 29 mmol/L (ref 20–32)
Calcium: 9.8 mg/dL (ref 8.6–10.3)
Chloride: 100 mmol/L (ref 98–110)
Creat: 0.76 mg/dL (ref 0.70–1.33)
GFR, Est African American: 117 mL/min/{1.73_m2} (ref >=60)
GFR, Est Non African American: 101 mL/min/{1.73_m2} (ref >=60)
Globulin: 3.2 g/dL (calc) (ref 1.9–3.7)
Glucose, Bld: 118 mg/dL — ABNORMAL HIGH (ref 65–99)
Potassium: 5 mmol/L (ref 3.5–5.3)
Sodium: 137 mmol/L (ref 135–146)
Total Bilirubin: 0.7 mg/dL (ref 0.2–1.2)
Total Protein: 7.3 g/dL (ref 6.1–8.1)

## 2020-06-05 ENCOUNTER — Encounter: Payer: Self-pay | Admitting: Family Medicine

## 2020-06-08 ENCOUNTER — Telehealth: Payer: Self-pay | Admitting: Sports Medicine

## 2020-06-08 NOTE — Telephone Encounter (Signed)
Filled out new set of [employer] disability papers

## 2020-06-09 ENCOUNTER — Other Ambulatory Visit: Payer: Self-pay

## 2020-06-09 ENCOUNTER — Ambulatory Visit (INDEPENDENT_AMBULATORY_CARE_PROVIDER_SITE_OTHER): Payer: Commercial Managed Care - PPO | Admitting: Sports Medicine

## 2020-06-09 DIAGNOSIS — M7542 Impingement syndrome of left shoulder: Secondary | ICD-10-CM | POA: Diagnosis not present

## 2020-06-09 NOTE — Progress Notes (Signed)
    Procedures performed today:    None.  Independent interpretation of notes and tests performed by another provider:   None.  Brief History, Exam, Impression, and Recommendations:    Impingement syndrome, shoulder, left Frederick Yang returns, he is a very pleasant 59 year old male with left shoulder impingement syndrome, I injected him at the last visit, that was his second subacromial injection and he returns today doing really well, he will be cognizant about how he moves his shoulder, and I can see him back on an as-needed basis. He does have an MRI safe pacemaker should we need to proceed with advanced imaging prior to referral for arthroscopy.    ___________________________________________ Gwen Her. Dianah Field, M.D., ABFM., CAQSM. Primary Care and [place] Instructor of [place] [facility]

## 2020-06-09 NOTE — Assessment & Plan Note (Signed)
Frederick Yang returns, he is a very pleasant 59 year old male with left shoulder impingement syndrome, I injected him at the last visit, that was his second subacromial injection and he returns today doing really well, he will be cognizant about how he moves his shoulder, and I can see him back on an as-needed basis. He does have an MRI safe pacemaker should we need to proceed with advanced imaging prior to referral for arthroscopy.

## 2020-06-15 ENCOUNTER — Telehealth: Payer: Self-pay | Admitting: *Deleted

## 2020-06-15 NOTE — Telephone Encounter (Signed)
Form completed,faxed,confirmation received and scanned into patient's chart. 

## 2020-07-22 ENCOUNTER — Other Ambulatory Visit: Payer: Self-pay | Admitting: Family Medicine

## 2020-08-21 LAB — HM COLONOSCOPY

## 2020-08-31 ENCOUNTER — Encounter: Payer: Self-pay | Admitting: Family Medicine

## 2020-08-31 ENCOUNTER — Other Ambulatory Visit: Payer: Self-pay

## 2020-08-31 ENCOUNTER — Ambulatory Visit (INDEPENDENT_AMBULATORY_CARE_PROVIDER_SITE_OTHER): Payer: 59 | Admitting: Family Medicine

## 2020-08-31 VITALS — BP 103/58 | HR 73 | Ht 67.0 in | Wt 251.0 lb

## 2020-08-31 DIAGNOSIS — K869 Disease of pancreas, unspecified: Secondary | ICD-10-CM

## 2020-08-31 DIAGNOSIS — E119 Type 2 diabetes mellitus without complications: Secondary | ICD-10-CM

## 2020-08-31 DIAGNOSIS — R0683 Snoring: Secondary | ICD-10-CM

## 2020-08-31 DIAGNOSIS — I5022 Chronic systolic (congestive) heart failure: Secondary | ICD-10-CM

## 2020-08-31 DIAGNOSIS — J069 Acute upper respiratory infection, unspecified: Secondary | ICD-10-CM

## 2020-08-31 LAB — POCT GLYCOSYLATED HEMOGLOBIN (HGB A1C): Hemoglobin A1C: 5.9 % — AB (ref 4.0–5.6)

## 2020-08-31 MED ORDER — XIGDUO XR 10-500 MG PO TB24: 1.0000 | ORAL_TABLET | Freq: Every day | ORAL | 1 refills | Status: DC

## 2020-08-31 NOTE — Assessment & Plan Note (Signed)
Repeat scan 2020 showed resolution of prior cyst though he has a new cyst present.

## 2020-08-31 NOTE — Assessment & Plan Note (Signed)
He is stable on his current regimen he is now off of coreLandor.

## 2020-08-31 NOTE — Progress Notes (Signed)
Established Patient Office Visit  Subjective:  Patient ID: Frederick Yang, male    DOB: 1961-03-20  Age: 59 y.o. MRN: [id number]  CC:  Chief Complaint  Patient presents with  . Diabetes    HPI Frederick Yang presents for   Diabetes - no hypoglycemic events. No wounds or sores that are not healing well. No increased thirst or urination. Checking glucose at home. Taking medications as prescribed without any side effects.  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  Fortunately the cyst that was there previously have resolved but he did have a new wound but Dr. Sheila Oats felt confident that this was most likely benign.  He is also had a repeat colonoscopy since then they did remove some benign polyps.  And he is doing well since then.  He did have some bruising and swelling over the IV site on the right antecubital fossa that he wanted me to look at today.  Reports that last week he had some cold symptoms after visiting his nephew's.  He said he mostly had irritated throat and a runny nose for couple of days he did take some cold medicine but he actually feels much better now.  He would like to get tested for sleep apnea.  He does have some mild snoring its not severe he does not wake up gasping for air but sometimes does feel short of breath.  Is been mentioned a couple different times to him that he might have sleep apnea.    Past Medical History:  Diagnosis Date  . Congestive heart failure, NYHA class 3 ([place]) 06/29/2019  . Diabetes mellitus without complication ([place])   . GERD (gastroesophageal reflux disease)   . Hepatic steatosis   . Hyperlipidemia   . Prostate hypertrophy     Past Surgical History:  Procedure Laterality Date  . APPENDECTOMY    . COLON RESECTION SIGMOID  11/2015  . HEMORRHOID BANDING  06/24/13   Dr. Ludwig Lean  . HEMORRHOID BANDING  07/12/2013  . HERNIA REPAIR     RT groin  . right shoulder surgery  03/06/2014   Dr. Tamera Punt   . TREATMENT  FISTULA ANAL      Family History  Problem Relation Age of Onset  . Breast cancer Mother 56  . Uterine cancer Mother   . Alcohol abuse Maternal Uncle   . Heart attack Maternal Uncle   . Stroke Maternal Uncle   . Lung cancer Maternal Grandfather   . Diabetes Maternal Aunt   . Hypertension Brother   . Hypertension Sister   . Diabetes Sister     Social History   Socioeconomic History  . Marital status: Married    Spouse name: Not on file  . Number of children: 3   . Years of education: Not on file  . Highest education level: Not on file  Occupational History    Employer: [employer]  Tobacco Use  . Smoking status: Former Smoker    Types: Cigarettes    Quit date: 06/27/1988    Years since quitting: 32.2  . Smokeless tobacco: Never Used  Substance and Sexual Activity  . Alcohol use: Yes  . Drug use: No  . Sexual activity: Not on file  Other Topics Concern  . Not on file  Social History Narrative   Started some exercise. Occ caffeine but not daily.    Social Determinants of Health   Financial Resource Strain: Not on file  Food Insecurity:  Not on file  Transportation Needs: Not on file  Physical Activity: Not on file  Stress: Not on file  Social Connections: Not on file  Intimate Partner Violence: Not on file    Outpatient Medications Prior to Visit  Medication Sig Dispense Refill  . atorvastatin (LIPITOR) 20 MG tablet TAKE 1 TABLET BY MOUTH EVERYDAY AT BEDTIME 90 tablet 3  . carvedilol (COREG) 12.5 MG tablet Take 12.5 mg by mouth 2 (two) times daily with a meal.    . DEXILANT 30 MG capsule TAKE 1 CAPSULE BY MOUTH EVERY DAY 90 capsule 3  . fluticasone (FLONASE) 50 MCG/ACT nasal spray SPRAY 2 SPRAYS INTO EACH NOSTRIL EVERY DAY 48 mL 1  . furosemide (LASIX) 40 MG tablet Take 60 mg by mouth daily.    . sacubitril-valsartan (ENTRESTO) 24-26 MG Take 1 tablet by mouth 2 (two) times daily.    . Dapagliflozin-metFORMIN HCl ER (XIGDUO XR) 10-500 MG TB24 Take 1 tablet by  mouth daily. 90 tablet 1   No facility-administered medications prior to visit.    No Known Allergies  ROS Review of Systems    Objective:    Physical Exam Constitutional:      Appearance: He is well-developed and well-nourished.  HENT:     Head: Normocephalic and atraumatic.  Cardiovascular:     Rate and Rhythm: Normal rate and regular rhythm.     Heart sounds: Normal heart sounds.  Pulmonary:     Effort: Pulmonary effort is normal.     Breath sounds: Normal breath sounds.  Skin:    General: Skin is warm and dry.  Neurological:     Mental Status: He is alert and oriented to person, place, and time.  Psychiatric:        Mood and Affect: Mood and affect normal.        Behavior: Behavior normal.     BP (!) 103/58   Pulse 73   Ht 5\' 7"  (1.702 m)   Wt 251 lb (113.9 kg)   SpO2 98%   BMI 39.31 kg/m  Wt Readings from Last 3 Encounters:  08/31/20 251 lb (113.9 kg)  06/02/20 252 lb (114.3 kg)  02/25/20 272 lb (123.4 kg)     Health Maintenance Due  Topic Date Due  . COLONOSCOPY (Pts 45-3yrs Insurance coverage will need to be confirmed)  08/09/2020    There are no preventive care reminders to display for this patient.  Lab Results  Component Value Date   TSH 0.80 02/25/2020   Lab Results  Component Value Date   WBC 6.2 02/25/2020   HGB 16.0 02/25/2020   HCT 47.8 02/25/2020   MCV 86.9 02/25/2020   PLT 195 02/25/2020   Lab Results  Component Value Date   NA 137 06/03/2020   K 5.0 06/03/2020   CO2 29 06/03/2020   GLUCOSE 118 (H) 06/03/2020   BUN 18 06/03/2020   CREATININE 0.76 06/03/2020   BILITOT 0.7 06/03/2020   ALKPHOS 66 02/07/2017   AST 10 06/03/2020   ALT 9 06/03/2020   PROT 7.3 06/03/2020   ALBUMIN 3.9 02/07/2017   CALCIUM 9.8 06/03/2020   ANIONGAP 7 06/06/2015   Lab Results  Component Value Date   CHOL 370 (H) 06/03/2020   Lab Results  Component Value Date   HDL 35 (L) 06/03/2020   Lab Results  Component Value Date   LDLCALC 292  (H) 06/03/2020   Lab Results  Component Value Date   TRIG 231 (H) 06/03/2020  Lab Results  Component Value Date   CHOLHDL 10.6 (H) 06/03/2020   Lab Results  Component Value Date   HGBA1C 5.9 (A) 08/31/2020      Assessment & Plan:   Problem List Items Addressed This Visit      Cardiovascular and Mediastinum   Chronic systolic congestive heart failure ([place])    He is stable on his current regimen he is now off of coreLandor.        Digestive   Pancreatic lesion    Repeat scan 2020 showed resolution of prior cyst though he has a new cyst present.        Endocrine   Diabetes mellitus without complication ([place]) - Primary    A1c looks great today at 5.9.  Much better than previous of 6.6.  Continue current regimen.  He is doing fantastic plan follow-up in 4 months if he still doing well at that time we could even consider decreasing his medication.      Relevant Medications   Dapagliflozin-metFORMIN HCl ER (XIGDUO XR) 10-500 MG TB24   Other Relevant Orders   POCT glycosylated hemoglobin (Hb A1C) (Completed)    Other Visit Diagnoses    Snoring       Relevant Orders   Home sleep test   Viral upper respiratory tract infection         URI - sxs resolved.    Snoring - STOP BANG score 5     Epworth sleepiness scale score 9. Will refer for HOme sleep Stud[place].    Meds ordered this encounter  Medications  . Dapagliflozin-metFORMIN HCl ER (XIGDUO XR) 10-500 MG TB24    Sig: Take 1 tablet by mouth daily.    Dispense:  90 tablet    Refill:  1    Follow-up: Return in about 4 months (around 12/31/2020) for Diabetes follow-up.    Beatrice Lecher, MD

## 2020-08-31 NOTE — Assessment & Plan Note (Signed)
A1c looks great today at 5.9.  Much better than previous of 6.6.  Continue current regimen.  He is doing fantastic plan follow-up in 4 months if he still doing well at that time we could even consider decreasing his medication.

## 2020-09-21 ENCOUNTER — Other Ambulatory Visit: Payer: Self-pay | Admitting: Family Medicine

## 2020-09-21 NOTE — Telephone Encounter (Signed)
Called and informed pt that Dexilant is not covered and he should contact his insurance company and also call the GI office to see if they have any recommendations for him. Pt agreed

## 2020-10-06 ENCOUNTER — Telehealth: Payer: Self-pay | Admitting: *Deleted

## 2020-10-06 NOTE — Telephone Encounter (Signed)
Form completed,faxed,confirmation received and scanned into patient's chart.

## 2020-12-29 ENCOUNTER — Telehealth: Payer: Self-pay | Admitting: Sports Medicine

## 2020-12-29 ENCOUNTER — Telehealth: Payer: Self-pay | Admitting: *Deleted

## 2020-12-29 NOTE — Telephone Encounter (Signed)
Form completed,faxed,confirmation received and scanned into patient's chart. 

## 2020-12-29 NOTE — Telephone Encounter (Signed)
Today I filled out Frederick Yang's disability forms for his shoulder again, they will be faxed and scanned.

## 2021-01-05 ENCOUNTER — Encounter: Payer: Self-pay | Admitting: Family Medicine

## 2021-01-05 ENCOUNTER — Other Ambulatory Visit: Payer: Self-pay

## 2021-01-05 ENCOUNTER — Ambulatory Visit (INDEPENDENT_AMBULATORY_CARE_PROVIDER_SITE_OTHER): Payer: 59 | Admitting: Family Medicine

## 2021-01-05 VITALS — BP 106/56 | HR 69 | Ht 67.0 in | Wt 246.0 lb

## 2021-01-05 DIAGNOSIS — I5022 Chronic systolic (congestive) heart failure: Secondary | ICD-10-CM

## 2021-01-05 DIAGNOSIS — E119 Type 2 diabetes mellitus without complications: Secondary | ICD-10-CM | POA: Diagnosis not present

## 2021-01-05 DIAGNOSIS — Z125 Encounter for screening for malignant neoplasm of prostate: Secondary | ICD-10-CM | POA: Diagnosis not present

## 2021-01-05 DIAGNOSIS — R42 Dizziness and giddiness: Secondary | ICD-10-CM | POA: Diagnosis not present

## 2021-01-05 LAB — POCT GLYCOSYLATED HEMOGLOBIN (HGB A1C): Hemoglobin A1C: 6 % — AB (ref 4.0–5.6)

## 2021-01-05 NOTE — Progress Notes (Signed)
Pt reports that he had an episode of feeling lightheaded/dizzy last week. He stated that when he got home and checked his BS, and BP his numbers were normal and he hasn't had any more episodes since.   He states that he had only 1 other episode of feeling this way some years ago while he was driving.    Dr. Shirlee More changed Coreg to 12.5 mg in the morning and 25 mg at bedtime

## 2021-01-05 NOTE — Progress Notes (Signed)
Established Patient Office Visit  Subjective:  Patient ID: Frederick Yang, male    DOB: Apr 06, 1961  Age: 60 y.o. MRN: [id number]  CC: No chief complaint on file.   HPI Frederick Yang presents for   Diabetes - no hypoglycemic events. No wounds or sores that are not healing well. No increased thirst or urination. Checking glucose at home. Taking medications as prescribed without any side effects.  He has cut back on his carbohydrate intake.  He did follow-up with cardiology recently and unfortunately did have a slight decline in his EF.  They did adjust his carvedilol so he is now taking 12.5 mg in the morning and 20 5 at night.  And he is now taking 40 mg of furosemide twice a day.   He did well let me know about an episode of lightheadedness/dizziness that occurred about a week ago while sitting at a friend's house he says it was a normal day he ate and drink normally.  He did not feel dehydrated he taken his medications he did not experience any chest pain shortness of breath or palpitations.  It just came on suddenly it gradually went away but then he just felt sort of tired for the rest of the day he says that when he got home he did check his blood sugar and his blood pressure and that they were both okay.  He also reports he had a colonoscopy with digestive health a couple of months ago so we will get that updated.  He was due for a 5-year recall.  Past Medical History:  Diagnosis Date   Congestive heart failure, NYHA class 3 ([place]) 06/29/2019   Diabetes mellitus without complication ([facility])    GERD (gastroesophageal reflux disease)    Hepatic steatosis    Hyperlipidemia    Prostate hypertrophy     Past Surgical History:  Procedure Laterality Date   APPENDECTOMY     COLON RESECTION SIGMOID  11/2015   HEMORRHOID BANDING  06/24/13   Dr. Ludwig Lean   HEMORRHOID BANDING  07/12/2013   HERNIA REPAIR     RT groin   right shoulder surgery  03/06/2014   Dr. Tamera Punt    TREATMENT  FISTULA ANAL      Family History  Problem Relation Age of Onset   Breast cancer Mother 28   Uterine cancer Mother    Alcohol abuse Maternal Uncle    Heart attack Maternal Uncle    Stroke Maternal Uncle    Lung cancer Maternal Grandfather    Diabetes Maternal Aunt    Hypertension Brother    Hypertension Sister    Diabetes Sister     Social History   Socioeconomic History   Marital status: Married    Spouse name: Not on file   Number of children: 3    Years of education: Not on file   Highest education level: Not on file  Occupational History    Employer: [employer]  Tobacco Use   Smoking status: Former    Pack years: 0.00    Types: Cigarettes    Quit date: 06/27/1988    Years since quitting: 32.5   Smokeless tobacco: Never  Substance and Sexual Activity   Alcohol use: Yes   Drug use: No   Sexual activity: Not on file  Other Topics Concern   Not on file  Social History Narrative   Started some exercise. Occ caffeine but not daily.    Social Determinants of Health  Financial Resource Strain: Not on file  Food Insecurity: Not on file  Transportation Needs: Not on file  Physical Activity: Not on file  Stress: Not on file  Social Connections: Not on file  Intimate Partner Violence: Not on file    Outpatient Medications Prior to Visit  Medication Sig Dispense Refill   atorvastatin (LIPITOR) 20 MG tablet TAKE 1 TABLET BY MOUTH EVERYDAY AT BEDTIME 90 tablet 3   carvedilol (COREG) 12.5 MG tablet Take 12.5 mg by mouth 2 (two) times daily with a meal. Take 12.5 mg in the morning, and 25 mg at bedtime.(Per Dr. Shirlee More)     Dapagliflozin-metFORMIN HCl ER (XIGDUO XR) 10-500 MG TB24 Take 1 tablet by mouth daily. 90 tablet 1   fluticasone (FLONASE) 50 MCG/ACT nasal spray SPRAY 2 SPRAYS INTO EACH NOSTRIL EVERY DAY 48 mL 1   furosemide (LASIX) 40 MG tablet Take 40 mg by mouth in the morning and at bedtime.     omeprazole (PRILOSEC) 20 MG capsule Take 1 capsule (20 mg  total) by mouth daily. 90 capsule 1   sacubitril-valsartan (ENTRESTO) 24-26 MG Take 1 tablet by mouth 2 (two) times daily.     furosemide (LASIX) 40 MG tablet Take 60 mg by mouth daily.     No facility-administered medications prior to visit.    No Known Allergies  ROS Review of Systems    Objective:    Physical Exam Constitutional:      Appearance: Normal appearance. He is well-developed.  HENT:     Head: Normocephalic and atraumatic.  Cardiovascular:     Rate and Rhythm: Normal rate and regular rhythm.     Heart sounds: Normal heart sounds.  Pulmonary:     Effort: Pulmonary effort is normal.     Breath sounds: Normal breath sounds.  Skin:    General: Skin is warm and dry.  Neurological:     Mental Status: He is alert and oriented to person, place, and time. Mental status is at baseline.  Psychiatric:        Behavior: Behavior normal.    BP (!) 106/56   Pulse 69   Ht 5\' 7"  (1.702 m)   Wt 246 lb (111.6 kg)   SpO2 99%   BMI 38.53 kg/m  Wt Readings from Last 3 Encounters:  01/05/21 246 lb (111.6 kg)  08/31/20 251 lb (113.9 kg)  06/02/20 252 lb (114.3 kg)     Health Maintenance Due  Topic Date Due   Zoster Vaccines- Shingrix (1 of 2) Never done   Pneumococcal Vaccine 70-36 Years old (2 - PCV) 06/29/2016   COLONOSCOPY (Pts 45-48yrs Insurance coverage will need to be confirmed)  08/09/2020   COVID-19 Vaccine (4 - Booster for [place] series) 08/18/2020   OPHTHALMOLOGY EXAM  10/28/2020    There are no preventive care reminders to display for this patient.  Lab Results  Component Value Date   TSH 0.80 02/25/2020   Lab Results  Component Value Date   WBC 6.2 02/25/2020   HGB 16.0 02/25/2020   HCT 47.8 02/25/2020   MCV 86.9 02/25/2020   PLT 195 02/25/2020   Lab Results  Component Value Date   NA 137 06/03/2020   K 5.0 06/03/2020   CO2 29 06/03/2020   GLUCOSE 118 (H) 06/03/2020   BUN 18 06/03/2020   CREATININE 0.76 06/03/2020   BILITOT 0.7 06/03/2020    ALKPHOS 66 02/07/2017   AST 10 06/03/2020   ALT 9 06/03/2020   PROT 7.3  06/03/2020   ALBUMIN 3.9 02/07/2017   CALCIUM 9.8 06/03/2020   ANIONGAP 7 06/06/2015   Lab Results  Component Value Date   CHOL 370 (H) 06/03/2020   Lab Results  Component Value Date   HDL 35 (L) 06/03/2020   Lab Results  Component Value Date   LDLCALC 292 (H) 06/03/2020   Lab Results  Component Value Date   TRIG 231 (H) 06/03/2020   Lab Results  Component Value Date   CHOLHDL 10.6 (H) 06/03/2020   Lab Results  Component Value Date   HGBA1C 6.0 (A) 01/05/2021      Assessment & Plan:   Problem List Items Addressed This Visit       Cardiovascular and Mediastinum   Chronic systolic congestive heart failure ([place])    Unfortunately at his recent follow-up with repeat echocardiogram he had a slight decline in his ejection fraction.  But otherwise is doing well.  He has increased his Lasix since I last saw him he is now taking 40 mg twice a day that seems to be controlling his fluid a little bit better.  He says once in a while he will tend to gain a couple pounds but then usually within a couple days it tends to come back off.  He follows with cardiology regularly.       Relevant Medications   furosemide (LASIX) 40 MG tablet     Endocrine   Diabetes mellitus without complication ([place]) - Primary    Well controlled. Continue current regimen. Follow up in  4 mo        Relevant Orders   POCT glycosylated hemoglobin (Hb A1C) (Completed)   Other Visit Diagnoses     Screening for prostate cancer       Relevant Orders   PSA   Dizziness           Will call for colonoscopy report.  No orders of the defined types were placed in this encounter.   Follow-up: Return in about 4 months (around 05/08/2021) for Diabetes follow-up.    Beatrice Lecher, MD

## 2021-01-05 NOTE — Assessment & Plan Note (Signed)
Unfortunately at his recent follow-up with repeat echocardiogram he had a slight decline in his ejection fraction.  But otherwise is doing well.  He has increased his Lasix since I last saw him he is now taking 40 mg twice a day that seems to be controlling his fluid a little bit better.  He says once in a while he will tend to gain a couple pounds but then usually within a couple days it tends to come back off.  He follows with cardiology regularly.

## 2021-01-05 NOTE — Assessment & Plan Note (Signed)
Well controlled. Continue current regimen. Follow up in  4 mo 

## 2021-01-06 LAB — PSA: PSA: 4.97 ng/mL — ABNORMAL HIGH (ref <=4.00)

## 2021-01-19 ENCOUNTER — Other Ambulatory Visit: Payer: Self-pay

## 2021-01-19 ENCOUNTER — Ambulatory Visit (INDEPENDENT_AMBULATORY_CARE_PROVIDER_SITE_OTHER): Payer: 59 | Admitting: Sports Medicine

## 2021-01-19 ENCOUNTER — Ambulatory Visit (INDEPENDENT_AMBULATORY_CARE_PROVIDER_SITE_OTHER): Payer: 59

## 2021-01-19 DIAGNOSIS — M7542 Impingement syndrome of left shoulder: Secondary | ICD-10-CM

## 2021-01-19 NOTE — Assessment & Plan Note (Addendum)
Frederick Yang is a very pleasant 60 year old male with chronic left shoulder impingement syndrome, currently had an exacerbation. This was last injected in November 2021, little by little he started to have recurrence of pain, worse over the deltoid, with overhead activities, sleeping, on exam he has positive impingement signs. Today we did a subacromial injection. He will do a rotator cuff conditioning exercises, I did cut him a meter of green Thera-Band. Return to see me as needed.

## 2021-01-19 NOTE — Progress Notes (Signed)
    Procedures performed today:    Procedure: Real-time Ultrasound Guided injection of the left subacromial bursa Device: Samsung [id number]  Verbal informed consent obtained.  Time-out conducted.  Noted no overlying erythema, induration, or other signs of local infection.  Skin prepped in a sterile fashion.  Local anesthesia: Topical Ethyl chloride.  With sterile technique and under real time ultrasound guidance: Noted intact rotator cuff, 1 cc Kenalog 40, 1 cc lidocaine, 1 cc bupivacaine injected easily Completed without difficulty  Advised to call if fevers/chills, erythema, induration, drainage, or persistent bleeding.  Images permanently stored and available for review in PACS.  Impression: Technically successful ultrasound guided injection.   Independent interpretation of notes and tests performed by another provider:   None.  Brief History, Exam, Impression, and Recommendations:    Impingement syndrome, shoulder, left Frederick Yang is a very pleasant 60 year old male with chronic left shoulder impingement syndrome, currently had an exacerbation. This was last injected in November 2021, little by little he started to have recurrence of pain, worse over the deltoid, with overhead activities, sleeping, on exam he has positive impingement signs. Today we did a subacromial injection. He will do a rotator cuff conditioning exercises, I did cut him a meter of green Thera-Band. Return to see me as needed.    ___________________________________________ Gwen Her. Dianah Field, M.D., ABFM., CAQSM. Primary Care and [place] Instructor of [place] [facility] of Medicine

## 2021-05-01 ENCOUNTER — Other Ambulatory Visit: Payer: Self-pay | Admitting: Family Medicine

## 2021-05-01 DIAGNOSIS — E119 Type 2 diabetes mellitus without complications: Secondary | ICD-10-CM

## 2021-05-10 ENCOUNTER — Ambulatory Visit (INDEPENDENT_AMBULATORY_CARE_PROVIDER_SITE_OTHER): Payer: 59 | Admitting: Family Medicine

## 2021-05-10 ENCOUNTER — Encounter: Payer: Self-pay | Admitting: Family Medicine

## 2021-05-10 VITALS — BP 120/60 | HR 79 | Ht 67.0 in | Wt 251.0 lb

## 2021-05-10 DIAGNOSIS — I5022 Chronic systolic (congestive) heart failure: Secondary | ICD-10-CM

## 2021-05-10 DIAGNOSIS — E119 Type 2 diabetes mellitus without complications: Secondary | ICD-10-CM

## 2021-05-10 DIAGNOSIS — E785 Hyperlipidemia, unspecified: Secondary | ICD-10-CM

## 2021-05-10 DIAGNOSIS — R972 Elevated prostate specific antigen [PSA]: Secondary | ICD-10-CM | POA: Diagnosis not present

## 2021-05-10 DIAGNOSIS — R3912 Poor urinary stream: Secondary | ICD-10-CM | POA: Diagnosis not present

## 2021-05-10 LAB — POCT GLYCOSYLATED HEMOGLOBIN (HGB A1C): Hemoglobin A1C: 6.6 % — AB (ref 4.0–5.6)

## 2021-05-10 MED ORDER — TAMSULOSIN HCL 0.4 MG PO CAPS: 0.4000 mg | ORAL_CAPSULE | Freq: Every day | ORAL | 3 refills | Status: DC

## 2021-05-10 NOTE — Assessment & Plan Note (Signed)
A1c up to 6.6 today.  Still well controlled but it is a significant jump from his previous A1c this summer of 6.0.  Encouraged him to get back on track with carb intake.  He is also gained a little bit of weight that is not fluid.

## 2021-05-10 NOTE — Progress Notes (Signed)
Established Patient Office Visit  Subjective:  Patient ID: Frederick Yang, male    DOB: July 03, 1960  Age: 60 y.o. MRN: [id number]  CC:  Chief Complaint  Patient presents with   Diabetes    HPI Frederick Yang presents for 62-month follow-up  Diabetes - no hypoglycemic events. No wounds or sores that are not healing well. No increased thirst or urination. Checking glucose at home. Taking medications as prescribed without any side effects.  Has been eating a little bit more carbs.   Did have some labs done recently at [place] including a BMP, pro BNP for his nonischemic cardiomyopathy.  He also reports that his EF continues to decline has been following with cardiology regularly.  His weight has also been climbing up a little bit he is usually around 245 pounds.  But he admits he has been indulging in carbs a little bit more he was previously little bit more strict with that.  Reports that over the last several months he has noticed a little more difficult to urinate sometimes its will stop and start.Marland Kitchen  He feels like it takes more pressure to actually push the urine out and decreased force of stream.  He has not had any abdominal pain or pelvic pain or discomfort.  He does report that the symptoms come and go some days it seems to he seems to urinate normally.  Last PSA was 4.94 months ago.  8 years ago 1.5, 4 years ago 3.9, 4 months ago 4.9.   Past Medical History:  Diagnosis Date   Congestive heart failure, NYHA class 3 ([place]) 06/29/2019   Diabetes mellitus without complication (HCC)    GERD (gastroesophageal reflux disease)    Hepatic steatosis    Hyperlipidemia    Prostate hypertrophy     Past Surgical History:  Procedure Laterality Date   APPENDECTOMY     COLON RESECTION SIGMOID  11/2015   HEMORRHOID BANDING  06/24/13   Dr. Ludwig Lean   HEMORRHOID BANDING  07/12/2013   HERNIA REPAIR     RT groin   right shoulder surgery  03/06/2014   Dr. Tamera Punt    TREATMENT  FISTULA ANAL      Family History  Problem Relation Age of Onset   Breast cancer Mother 57   Uterine cancer Mother    Alcohol abuse Maternal Uncle    Heart attack Maternal Uncle    Stroke Maternal Uncle    Lung cancer Maternal Grandfather    Diabetes Maternal Aunt    Hypertension Brother    Hypertension Sister    Diabetes Sister     Social History   Socioeconomic History   Marital status: Married    Spouse name: Not on file   Number of children: 3    Years of education: Not on file   Highest education level: Not on file  Occupational History    Employer: [employer]  Tobacco Use   Smoking status: Former    Types: Cigarettes    Quit date: 06/27/1988    Years since quitting: 32.8   Smokeless tobacco: Never  Substance and Sexual Activity   Alcohol use: Yes   Drug use: No   Sexual activity: Not on file  Other Topics Concern   Not on file  Social History Narrative   Started some exercise. Occ caffeine but not daily.    Social Determinants of Health   Financial Resource Strain: Not on file  Food Insecurity: Not on file  Transportation  Needs: Not on file  Physical Activity: Not on file  Stress: Not on file  Social Connections: Not on file  Intimate Partner Violence: Not on file    Outpatient Medications Prior to Visit  Medication Sig Dispense Refill   atorvastatin (LIPITOR) 20 MG tablet TAKE 1 TABLET BY MOUTH EVERYDAY AT BEDTIME 90 tablet 3   carvedilol (COREG) 12.5 MG tablet Take 12.5 mg by mouth 2 (two) times daily with a meal. Take 12.5 mg in the morning, and 25 mg at bedtime.(Per Dr. Shirlee More)     fluticasone (FLONASE) 50 MCG/ACT nasal spray SPRAY 2 SPRAYS INTO EACH NOSTRIL EVERY DAY 48 mL 1   furosemide (LASIX) 40 MG tablet Take 40 mg by mouth in the morning and at bedtime.     omeprazole (PRILOSEC) 20 MG capsule Take 1 capsule (20 mg total) by mouth daily. 90 capsule 1   sacubitril-valsartan (ENTRESTO) 24-26 MG Take 1 tablet by mouth 2 (two) times daily.      XIGDUO XR 10-500 MG TB24 TAKE 1 TABLET BY MOUTH EVERY DAY 90 tablet 1   No facility-administered medications prior to visit.    Allergies  Allergen Reactions   Spironolactone Itching    ROS Review of Systems    Objective:    Physical Exam Constitutional:      Appearance: Normal appearance. He is well-developed.  HENT:     Head: Normocephalic and atraumatic.  Cardiovascular:     Rate and Rhythm: Normal rate and regular rhythm.     Heart sounds: Normal heart sounds.  Pulmonary:     Effort: Pulmonary effort is normal.     Breath sounds: Normal breath sounds.  Genitourinary:    Comments: Prostate gland just mildly enlarged.  A bit larger on the right side versus the left. Skin:    General: Skin is warm and dry.  Neurological:     Mental Status: He is alert and oriented to person, place, and time. Mental status is at baseline.  Psychiatric:        Behavior: Behavior normal.    BP 120/60   Pulse 79   Ht 5\' 7"  (1.702 m)   Wt 251 lb (113.9 kg)   SpO2 100%   BMI 39.31 kg/m  Wt Readings from Last 3 Encounters:  05/10/21 251 lb (113.9 kg)  01/05/21 246 lb (111.6 kg)  08/31/20 251 lb (113.9 kg)     Health Maintenance Due  Topic Date Due   Zoster Vaccines- Shingrix (1 of 2) Never done   Pneumococcal Vaccine 35-53 Years old (2 - PCV) 06/29/2016   COLONOSCOPY (Pts 45-48yrs Insurance coverage will need to be confirmed)  08/09/2020    There are no preventive care reminders to display for this patient.  Lab Results  Component Value Date   TSH 0.80 02/25/2020   Lab Results  Component Value Date   WBC 6.2 02/25/2020   HGB 16.0 02/25/2020   HCT 47.8 02/25/2020   MCV 86.9 02/25/2020   PLT 195 02/25/2020   Lab Results  Component Value Date   NA 137 06/03/2020   K 5.0 06/03/2020   CO2 29 06/03/2020   GLUCOSE 118 (H) 06/03/2020   BUN 18 06/03/2020   CREATININE 0.76 06/03/2020   BILITOT 0.7 06/03/2020   ALKPHOS 66 02/07/2017   AST 10 06/03/2020   ALT 9  06/03/2020   PROT 7.3 06/03/2020   ALBUMIN 3.9 02/07/2017   CALCIUM 9.8 06/03/2020   ANIONGAP 7 06/06/2015   Lab Results  Component Value Date   CHOL 370 (H) 06/03/2020   Lab Results  Component Value Date   HDL 35 (L) 06/03/2020   Lab Results  Component Value Date   LDLCALC 292 (H) 06/03/2020   Lab Results  Component Value Date   TRIG 231 (H) 06/03/2020   Lab Results  Component Value Date   CHOLHDL 10.6 (H) 06/03/2020   Lab Results  Component Value Date   HGBA1C 6.6 (A) 05/10/2021      Assessment & Plan:   Problem List Items Addressed This Visit       Cardiovascular and Mediastinum   Chronic systolic congestive heart failure ([place])    Discussed may be looking at his Xigduo to see if there is 1 SGLT2 that has a little bit better data in regards to heart failure.        Endocrine   Diabetes mellitus without complication ([place]) - Primary    A1c up to 6.6 today.  Still well controlled but it is a significant jump from his previous A1c this summer of 6.0.  Encouraged him to get back on track with carb intake.  He is also gained a little bit of weight that is not fluid.      Relevant Orders   POCT glycosylated hemoglobin (Hb A1C) (Completed)   Lipid panel     Other   Hyperlipidemia   Relevant Orders   POCT glycosylated hemoglobin (Hb A1C) (Completed)   Lipid panel   Other Visit Diagnoses     Elevated PSA       Relevant Orders   PSA   Weak urinary stream           Weak urinary stream - discussed potential causes including prostate and urethral stricture.  Trial of flomax for 3-4 weeks. If not helping will refer to Urology.    Will recheck PSA since elevated last time.    Meds ordered this encounter  Medications   tamsulosin (FLOMAX) 0.4 MG CAPS capsule    Sig: Take 1 capsule (0.4 mg total) by mouth daily.    Dispense:  30 capsule    Refill:  3     Follow-up: Return in about 14 weeks (around 08/16/2021) for Diabetes follow-up.    Beatrice Lecher, MD

## 2021-05-10 NOTE — Assessment & Plan Note (Signed)
Discussed may be looking at his Xigduo to see if there is 1 SGLT2 that has a little bit better data in regards to heart failure.

## 2021-05-10 NOTE — Patient Instructions (Signed)
We will try the Flomax once a day for the next 3 weeks to see if you notice improvement.  If its not helping then our plan is to refer you to urology for further evaluation.  If it is helpful then we can try taking it through the holidays and then after that may become off and see if you are doing okay.

## 2021-05-12 ENCOUNTER — Encounter: Payer: Self-pay | Admitting: Family Medicine

## 2021-05-12 LAB — LIPID PANEL
Cholesterol: 206 mg/dL — ABNORMAL HIGH (ref <200)
HDL: 47 mg/dL (ref >=40)
LDL Cholesterol (Calc): 135 mg/dL (calc) — ABNORMAL HIGH
Non-HDL Cholesterol (Calc): 159 mg/dL (calc) — ABNORMAL HIGH (ref <130)
Total CHOL/HDL Ratio: 4.4 (calc) (ref <5.0)
Triglycerides: 129 mg/dL (ref <150)

## 2021-05-12 LAB — PSA: PSA: 5.22 ng/mL — ABNORMAL HIGH (ref <=4.00)

## 2021-05-12 NOTE — Progress Notes (Signed)
Hi Jullien, LDL is back down to 135 which is good.  It still needs to be under 100 so I would like to increase your cholesterol medication if that is okay you are still on the low dose would like to increase it to a more moderate dose.  Get see if that gets is close to where we need to be.  Your prostate blood test is up just a little bit more than 4 months ago it is 5.2, it was 4.9 previously.  Because of the trend I would like to refer you to urology for further evaluation.  If you are okay with that please let us know and if you have a preference for provider or location please let us know.

## 2021-05-14 ENCOUNTER — Other Ambulatory Visit: Payer: Self-pay | Admitting: *Deleted

## 2021-05-14 DIAGNOSIS — R3912 Poor urinary stream: Secondary | ICD-10-CM

## 2021-05-28 ENCOUNTER — Ambulatory Visit (INDEPENDENT_AMBULATORY_CARE_PROVIDER_SITE_OTHER): Payer: 59 | Admitting: Sports Medicine

## 2021-05-28 ENCOUNTER — Other Ambulatory Visit: Payer: Self-pay

## 2021-05-28 ENCOUNTER — Ambulatory Visit (INDEPENDENT_AMBULATORY_CARE_PROVIDER_SITE_OTHER): Payer: 59

## 2021-05-28 DIAGNOSIS — M5030 Other cervical disc degeneration, unspecified cervical region: Secondary | ICD-10-CM | POA: Insufficient documentation

## 2021-05-28 DIAGNOSIS — M542 Cervicalgia: Secondary | ICD-10-CM

## 2021-05-28 DIAGNOSIS — G8929 Other chronic pain: Secondary | ICD-10-CM

## 2021-05-28 MED ORDER — PREDNISONE 50 MG PO TABS
ORAL_TABLET | ORAL | 0 refills | Status: DC
Start: 2021-05-28 — End: 2021-08-31
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
## 2021-06-15 ENCOUNTER — Other Ambulatory Visit: Payer: Self-pay | Admitting: Family Medicine

## 2021-07-09 ENCOUNTER — Ambulatory Visit: Payer: 59 | Admitting: Sports Medicine

## 2021-08-07 ENCOUNTER — Other Ambulatory Visit: Payer: Self-pay | Admitting: Family Medicine

## 2021-08-10 ENCOUNTER — Other Ambulatory Visit: Payer: Self-pay | Admitting: Family Medicine

## 2021-08-16 ENCOUNTER — Ambulatory Visit (INDEPENDENT_AMBULATORY_CARE_PROVIDER_SITE_OTHER): Payer: 59 | Admitting: Family Medicine

## 2021-08-16 ENCOUNTER — Encounter: Payer: Self-pay | Admitting: Family Medicine

## 2021-08-16 ENCOUNTER — Other Ambulatory Visit: Payer: Self-pay

## 2021-08-16 VITALS — BP 108/65 | HR 72 | Resp 18 | Ht 67.0 in | Wt 249.0 lb

## 2021-08-16 DIAGNOSIS — Z23 Encounter for immunization: Secondary | ICD-10-CM

## 2021-08-16 DIAGNOSIS — C61 Malignant neoplasm of prostate: Secondary | ICD-10-CM

## 2021-08-16 DIAGNOSIS — R972 Elevated prostate specific antigen [PSA]: Secondary | ICD-10-CM

## 2021-08-16 DIAGNOSIS — F5101 Primary insomnia: Secondary | ICD-10-CM

## 2021-08-16 DIAGNOSIS — L918 Other hypertrophic disorders of the skin: Secondary | ICD-10-CM

## 2021-08-16 DIAGNOSIS — G4700 Insomnia, unspecified: Secondary | ICD-10-CM | POA: Insufficient documentation

## 2021-08-16 DIAGNOSIS — E119 Type 2 diabetes mellitus without complications: Secondary | ICD-10-CM

## 2021-08-16 DIAGNOSIS — I5022 Chronic systolic (congestive) heart failure: Secondary | ICD-10-CM

## 2021-08-16 LAB — POCT GLYCOSYLATED HEMOGLOBIN (HGB A1C): Hemoglobin A1C: 6.4 % — AB (ref 4.0–5.6)

## 2021-08-16 NOTE — Assessment & Plan Note (Addendum)
Unfortunately he has had a slight decline in cardiac function.  They did not make any medication changes to his regimen at his appointment in January.  They will see him back in April..  Cardiology is following him closely.

## 2021-08-16 NOTE — Assessment & Plan Note (Signed)
Follows with urology.  Recently shown to have a few adenocarcinoma type cells.  They are following him closely.

## 2021-08-16 NOTE — Assessment & Plan Note (Signed)
A1c looks fantastic today at 6.4.  Continue current regimen.  Follow-up in 4 months.

## 2021-08-16 NOTE — Progress Notes (Signed)
Established Patient Office Visit  Subjective:  Patient ID: Frederick Yang, male    DOB: September 08, 1960  Age: 61 y.o. MRN: [id number]  CC:  Chief Complaint  Patient presents with   Diabetes    Follow up    [place]    Left side of back for several years/getting painful.    Insomnia    Several years/getting worse for several weeks.    Diabetes Eye Exam    Patient will schedule eye exam with [facility] in 11/2021 due to insurance     HPI Frederick Yang presents for   Diabetes - no hypoglycemic events. No wounds or sores that are not healing well. No increased thirst or urination. Checking glucose at home. Taking medications as prescribed without any side effects.  Recently started back on a low-carb diet.  F/U Chronic systolic heart failure-he says unfortunately his heart function has actually declined a little bit he follows with cardiology they did not make any adjustments to his regimen.  He is on carvedilol, Lisabeth Register, Inspra.  Insomnia-patient reports insomnia has been going on for years but feels like its been getting worse over the last several weeks.  He also has a mole on his back he would like me to take a look at today its been there for several years but he feels like more recently its become painful.  Its been bothering him to the point that he is been keeping a Band-Aid on it so it does not get rubbed or irritated.  He did follow-up with urology and they have diagnosed him with a mild form of prostate cancer.  He had multiple core biopsies and one of them showed prostatic adenocarcinoma with a Gleason score of 3+3 equal 6 grade group 1 involving less than 5% of one of the cores.  All others were negative.  His PSA had trended up slightly.  For now they are just going to monitor.  He will follow-up in 6 months.    Past Medical History:  Diagnosis Date   Congestive heart failure, NYHA class 3 ([place]) 06/29/2019   Diabetes mellitus without complication (HCC)    GERD  (gastroesophageal reflux disease)    Hepatic steatosis    Hyperlipidemia    Prostate hypertrophy     Past Surgical History:  Procedure Laterality Date   APPENDECTOMY     COLON RESECTION SIGMOID  11/2015   HEMORRHOID BANDING  06/24/13   Dr. Ludwig Lean   HEMORRHOID BANDING  07/12/2013   HERNIA REPAIR     RT groin   right shoulder surgery  03/06/2014   Dr. Tamera Punt    TREATMENT FISTULA ANAL      Family History  Problem Relation Age of Onset   Breast cancer Mother 74   Uterine cancer Mother    Alcohol abuse Maternal Uncle    Heart attack Maternal Uncle    Stroke Maternal Uncle    Lung cancer Maternal Grandfather    Diabetes Maternal Aunt    Hypertension Brother    Hypertension Sister    Diabetes Sister     Social History   Socioeconomic History   Marital status: Married    Spouse name: Not on file   Number of children: 3    Years of education: Not on file   Highest education level: Not on file  Occupational History    Employer: [employer]  Tobacco Use   Smoking status: Former    Types: Cigarettes  Quit date: 06/27/1988    Years since quitting: 33.1   Smokeless tobacco: Never  Substance and Sexual Activity   Alcohol use: Yes   Drug use: No   Sexual activity: Not on file  Other Topics Concern   Not on file  Social History Narrative   Started some exercise. Occ caffeine but not daily.    Social Determinants of Health   Financial Resource Strain: Not on file  Food Insecurity: Not on file  Transportation Needs: Not on file  Physical Activity: Not on file  Stress: Not on file  Social Connections: Not on file  Intimate Partner Violence: Not on file    Outpatient Medications Prior to Visit  Medication Sig Dispense Refill   atorvastatin (LIPITOR) 20 MG tablet TAKE 1 TABLET BY MOUTH EVERYDAY AT BEDTIME 90 tablet 0   carvedilol (COREG) 12.5 MG tablet Take 12.5 mg by mouth 2 (two) times daily with a meal. Take 12.5 mg in the morning, and 25 mg at  bedtime.(Per Dr. Shirlee More)     fluticasone (FLONASE) 50 MCG/ACT nasal spray SPRAY 2 SPRAYS INTO EACH NOSTRIL EVERY DAY 48 mL 1   furosemide (LASIX) 40 MG tablet Take 40 mg by mouth in the morning and at bedtime.     omeprazole (PRILOSEC) 20 MG capsule TAKE 1 CAPSULE BY MOUTH EVERY DAY 90 capsule 0   predniSONE (DELTASONE) 50 MG tablet One tab PO daily for 5 days. 5 tablet 0   sacubitril-valsartan (ENTRESTO) 24-26 MG Take 1 tablet by mouth 2 (two) times daily.     tamsulosin (FLOMAX) 0.4 MG CAPS capsule TAKE 1 CAPSULE BY MOUTH EVERY DAY 90 capsule 0   XIGDUO XR 10-500 MG TB24 TAKE 1 TABLET BY MOUTH EVERY DAY 90 tablet 1   No facility-administered medications prior to visit.    No Active Allergies  ROS Review of Systems    Objective:    Physical Exam Constitutional:      Appearance: Normal appearance. He is well-developed.  HENT:     Head: Normocephalic and atraumatic.  Cardiovascular:     Rate and Rhythm: Normal rate and regular rhythm.     Heart sounds: Normal heart sounds.  Pulmonary:     Effort: Pulmonary effort is normal.     Breath sounds: Normal breath sounds.  Skin:    General: Skin is warm and dry.     Comments: Large skin tag on that left lower back that is flattened.  It keeps getting caught on clothing and getting irritated and painful he also has a similar skin tag in the right groin crease.  Neurological:     Mental Status: He is alert and oriented to person, place, and time. Mental status is at baseline.  Psychiatric:        Behavior: Behavior normal.    BP 108/65    Pulse 72    Resp 18    Ht 5\' 7"  (1.702 m)    Wt 249 lb (112.9 kg)    SpO2 97%    BMI 39.00 kg/m  Wt Readings from Last 3 Encounters:  08/16/21 249 lb (112.9 kg)  05/10/21 251 lb (113.9 kg)  01/05/21 246 lb (111.6 kg)     There are no preventive care reminders to display for this patient.   There are no preventive care reminders to display for this patient.  Lab Results  Component Value  Date   TSH 0.80 02/25/2020   Lab Results  Component Value Date   WBC  6.2 02/25/2020   HGB 16.0 02/25/2020   HCT 47.8 02/25/2020   MCV 86.9 02/25/2020   PLT 195 02/25/2020   Lab Results  Component Value Date   NA 137 06/03/2020   K 5.0 06/03/2020   CO2 29 06/03/2020   GLUCOSE 118 (H) 06/03/2020   BUN 18 06/03/2020   CREATININE 0.76 06/03/2020   BILITOT 0.7 06/03/2020   ALKPHOS 66 02/07/2017   AST 10 06/03/2020   ALT 9 06/03/2020   PROT 7.3 06/03/2020   ALBUMIN 3.9 02/07/2017   CALCIUM 9.8 06/03/2020   ANIONGAP 7 06/06/2015   Lab Results  Component Value Date   CHOL 206 (H) 05/11/2021   Lab Results  Component Value Date   HDL 47 05/11/2021   Lab Results  Component Value Date   LDLCALC 135 (H) 05/11/2021   Lab Results  Component Value Date   TRIG 129 05/11/2021   Lab Results  Component Value Date   CHOLHDL 4.4 05/11/2021   Lab Results  Component Value Date   HGBA1C 6.4 (A) 08/16/2021      Assessment & Plan:   Problem List Items Addressed This Visit       Cardiovascular and Mediastinum   Chronic systolic congestive heart failure ([place])    Unfortunately he has had a slight decline in cardiac function.  They did not make any medication changes to his regimen at his appointment in January.  They will see him back in April..  Cardiology is following him closely.        Endocrine   Diabetes mellitus without complication ([place]) - Primary    A1c looks fantastic today at 6.4.  Continue current regimen.  Follow-up in 4 months.      Relevant Orders   POCT HgB A1C (Completed)     Other   Insomnia    Orts sleep difficulty for years.  Has tried some over-the-counter medications such as Benadryl, ZzzQuil, melatonin.  Melancholic tone and cause some weird dreams.  He has significant difficulty falling asleep but often times takes him greater than an hour.  And more recently he has been waking up in the middle the night.  On average getting about 4 to 5 hours per  night.  He plans on trying some other strategies first before considering prescription medication.      Elevated PSA, less than 10 ng/ml    Follows with urology.  Recently shown to have a few adenocarcinoma type cells.  They are following him closely.      Other Visit Diagnoses     Need for pneumococcal vaccination       Relevant Orders   Pneumococcal conjugate vaccine 20-valent (Prevnar 20) (Completed)   Adenocarcinoma of prostate, stage 1 ([place])       Skin tag          Has a pretty large skin tag on the left lower back and right groin crease-I am more than happy to set up an appointment to remove those think we could do that with shave biopsy.  No orders of the defined types were placed in this encounter.   Follow-up: Return in about 4 months (around 12/14/2021) for Diabetes follow-up.    Beatrice Lecher, MD

## 2021-08-16 NOTE — Assessment & Plan Note (Signed)
Orts sleep difficulty for years.  Has tried some over-the-counter medications such as Benadryl, ZzzQuil, melatonin.  Melancholic tone and cause some weird dreams.  He has significant difficulty falling asleep but often times takes him greater than an hour.  And more recently he has been waking up in the middle the night.  On average getting about 4 to 5 hours per night.  He plans on trying some other strategies first before considering prescription medication.

## 2021-08-31 ENCOUNTER — Other Ambulatory Visit: Payer: Self-pay

## 2021-08-31 ENCOUNTER — Encounter: Payer: Self-pay | Admitting: Family Medicine

## 2021-08-31 ENCOUNTER — Ambulatory Visit (INDEPENDENT_AMBULATORY_CARE_PROVIDER_SITE_OTHER): Payer: 59 | Admitting: Family Medicine

## 2021-08-31 VITALS — BP 113/59 | HR 75 | Resp 18 | Ht 67.0 in | Wt 247.0 lb

## 2021-08-31 DIAGNOSIS — L918 Other hypertrophic disorders of the skin: Secondary | ICD-10-CM

## 2021-08-31 NOTE — Progress Notes (Signed)
? ?  Established Patient Office Visit ? ?Subjective:  ?Patient ID: Frederick Yang, male    DOB: Mar 03, 1961  Age: 61 y.o. MRN: [id number] ? ?CC:  ?Chief Complaint  ?Patient presents with  ? Skin Tag removal  ?  Left side of back, right hip and back of left leg.   ? ? ?HPI ?[name] presents for skin tags.  He has several large ones including 1 on his right hip, left inner thigh, left low back.  And then he has some smaller ones under his left axilla, left side of his neck, and right low back that he would like removed today if possible. ? ?ROS ?Review of Systems ? ?  ?Objective:  ?  ?Physical Exam ? ?BP (!) 113/59   Pulse 75   Resp 18   Ht '5\' 7"'$  (1.702 m)   Wt 247 lb (112 kg)   SpO2 97%   BMI 38.69 kg/m?  ? ?Skin Tag Removal Procedure Note ?Diagnosis: inflamed skin tags ?Location:  Right hip, left inner thigh, left low back, right low back, left axilla, left side of neck, around left eye ?Informed Consent: Discussed risks (permanent scarring, infection, pain, bleeding, bruising, redness, and recurrence of the lesion) and benefits of the procedure, as well as the alternatives. He is aware that skin tags are benign lesions, and their removal is often not considered medically necessary. Informed consent was obtained. ?Preparation: The area was prepared in a standard fashion. ?Anesthesia: Lidocaine 1% without epinephrine, for the 3 largest lesions they were around 1 cm in size so these were anesthetized at the base because I had to use a double blade to remove them.  All additional skin tags were removed with forceps and iris scissors. ?Procedure Details: Iris scissors were used to perform sharp removal. Nitro swab was applied for hemostasis. Ointment and bandage were applied where needed. The patient tolerated the procedure well. ?Total number of lesions treated: 15 ?Plan: The patient was instructed on post-op care. Recommend OTC analgesia as needed for pain. ? ?  ?Assessment & Plan:  ? ?Problem List Items  Addressed This Visit   ?None ?Visit Diagnoses   ? ? Multiple acquired skin tags    -  Primary  ? ?  ? ?Patient tolerated procedure well.  Follow-up wound care discussed. ? ?No orders of the defined types were placed in this encounter. ? ? ?Follow-up: No follow-ups on file.  ? ? ?Beatrice Lecher, MD ?

## 2021-09-09 ENCOUNTER — Other Ambulatory Visit: Payer: Self-pay | Admitting: Family Medicine

## 2021-10-11 ENCOUNTER — Encounter: Payer: Self-pay | Admitting: Sports Medicine

## 2021-10-29 ENCOUNTER — Telehealth: Payer: Self-pay | Admitting: *Deleted

## 2021-10-29 NOTE — Telephone Encounter (Signed)
Received fax from [employer] requesting information about his imposing work or activity restrictions. ? ?Asked that he rtn call to discuss.   ?

## 2021-11-04 LAB — HM DIABETES EYE EXAM

## 2021-11-13 ENCOUNTER — Other Ambulatory Visit: Payer: Self-pay | Admitting: Family Medicine

## 2021-11-13 DIAGNOSIS — E119 Type 2 diabetes mellitus without complications: Secondary | ICD-10-CM

## 2021-12-13 ENCOUNTER — Other Ambulatory Visit: Payer: Self-pay | Admitting: Family Medicine

## 2021-12-16 ENCOUNTER — Ambulatory Visit (INDEPENDENT_AMBULATORY_CARE_PROVIDER_SITE_OTHER): Payer: 59 | Admitting: Family Medicine

## 2021-12-16 ENCOUNTER — Encounter: Payer: Self-pay | Admitting: Family Medicine

## 2021-12-16 VITALS — BP 120/75 | HR 86 | Ht 67.0 in | Wt 249.0 lb

## 2021-12-16 DIAGNOSIS — I5022 Chronic systolic (congestive) heart failure: Secondary | ICD-10-CM | POA: Diagnosis not present

## 2021-12-16 DIAGNOSIS — R972 Elevated prostate specific antigen [PSA]: Secondary | ICD-10-CM

## 2021-12-16 DIAGNOSIS — E119 Type 2 diabetes mellitus without complications: Secondary | ICD-10-CM | POA: Diagnosis not present

## 2021-12-16 LAB — POCT GLYCOSYLATED HEMOGLOBIN (HGB A1C): Hemoglobin A1C: 7 % — AB (ref 4.0–5.6)

## 2021-12-16 NOTE — Progress Notes (Signed)
Established Patient Office Visit  Subjective   Patient ID: Frederick Yang, male    DOB: 31-Jul-1960  Age: 61 y.o. MRN: [id number]  Chief Complaint  Patient presents with   Diabetes    HPI  Diabetes - no hypoglycemic events. No wounds or sores that are not healing well. No increased thirst or urination. Checking glucose at home. Taking medications as prescribed without any side effects. Now on Jardiance and metformin.  Xigduo not on formulary at the [place]. he just switched over medications maybe a couple of weeks ago.  So far tolerating it well.  Has not been exercising recently.  Cardiomyopathy with heart failure-no recent swelling he says most of the time though if he gets volume overloaded, he just notices his weight goes up.  He does not really notice any extremity swelling.  He says starting in April his weight did gradually go up about 6 to 7 pounds above his baseline.  He did not change any of his medications or adjust his diuretic and says that recently its actually been going back down on its own which is good.  At the [facility] they did start eplerenone.  So far he is tolerating that well.  They had to discontinue the spironolactone because it was causing significant itching on his feet at night.  He never developed a rash but as soon as he stopped the medication it went away.  He would like to have his PSA rechecked.  Last PSA had actually come down a little bit last November to 5.2.  Since its been more than 6 months he like to check it again.    ROS    Objective:     BP 120/75   Pulse 86   Ht '5\' 7"'$  (1.702 m)   Wt 249 lb (112.9 kg)   SpO2 98%   BMI 39.00 kg/m    Physical Exam Constitutional:      Appearance: He is well-developed.  HENT:     Head: Normocephalic and atraumatic.  Cardiovascular:     Rate and Rhythm: Normal rate and regular rhythm.     Heart sounds: Normal heart sounds.  Pulmonary:     Effort: Pulmonary effort is normal.     Breath sounds: Normal breath  sounds.  Skin:    General: Skin is warm and dry.  Neurological:     Mental Status: He is alert and oriented to person, place, and time.  Psychiatric:        Behavior: Behavior normal.      Results for orders placed or performed in visit on 12/16/21  POCT glycosylated hemoglobin (Hb A1C)  Result Value Ref Range   Hemoglobin A1C 7.0 (A) 4.0 - 5.6 %   HbA1c POC (<> result, manual entry)     HbA1c, POC (prediabetic range)     HbA1c, POC (controlled diabetic range)        The 10-year ASCVD risk score (Arnett DK, et al., 2019) is: 21.2%    Assessment & Plan:   Problem List Items Addressed This Visit       Cardiovascular and Mediastinum   Chronic systolic congestive heart failure ([place])    On Entersto, Jardiance, and Inspra.  Sounds like he was accumulating some increased volume recently but it seems to be back down to normal now.      Relevant Medications   sacubitril-valsartan (ENTRESTO) 49-51 MG   eplerenone (INSPRA) 25 MG tablet   Other Relevant Orders   Lipid Panel w/reflex  Direct LDL     Endocrine   Diabetes mellitus without complication ([place]) - Primary    Not well controlled.  A1c went up to 7.0 from prior of 6.44 months ago.  He says he plans to really work on cutting back on sugars and carbs which she is gotten a little bit more slack about recently.  Also planning to increase activity and exercise level a little bit more.  Continue current regimen. Follow up in  6 mo.  Normally I would see him back sooner but he does have an appointment in August at the [place].  So when I see him back in the fall then we will determine how often he would like to come in for [place] to monitor.      Relevant Medications   metFORMIN (GLUCOPHAGE-XR) 500 MG 24 hr tablet   empagliflozin (JARDIANCE) 25 MG TABS tablet   Other Relevant Orders   POCT glycosylated hemoglobin (Hb A1C) (Completed)   Urine microalbumin-creatinine with uACR   COMPLETE METABOLIC PANEL WITH GFR   Lipid Panel w/reflex  Direct LDL     Other   Elevated PSA, less than 10 ng/ml   Relevant Orders   PSA    Return in about 5 months (around 05/18/2022) for Diabetes follow-up.    Beatrice Lecher, MD

## 2021-12-16 NOTE — Assessment & Plan Note (Addendum)
On Entersto, Jardiance, and Inspra.  Sounds like he was accumulating some increased volume recently but it seems to be back down to normal now.

## 2021-12-16 NOTE — Assessment & Plan Note (Addendum)
Not well controlled.  A1c went up to 7.0 from prior of 6.44 months ago.  He says he plans to really work on cutting back on sugars and carbs which she is gotten a little bit more slack about recently.  Also planning to increase activity and exercise level a little bit more.  Continue current regimen. Follow up in  6 mo.  Normally I would see him back sooner but he does have an appointment in August at the [place].  So when I see him back in the fall then we will determine how often he would like to come in for [place] to monitor.

## 2021-12-17 LAB — LIPID PANEL W/REFLEX DIRECT LDL
Cholesterol: 174 mg/dL (ref <200)
HDL: 42 mg/dL (ref >=40)
LDL Cholesterol (Calc): 99 mg/dL (calc)
Non-HDL Cholesterol (Calc): 132 mg/dL (calc) — ABNORMAL HIGH (ref <130)
Total CHOL/HDL Ratio: 4.1 (calc) (ref <5.0)
Triglycerides: 213 mg/dL — ABNORMAL HIGH (ref <150)

## 2021-12-17 LAB — COMPLETE METABOLIC PANEL WITH GFR
AG Ratio: 1.3 (calc) (ref 1.0–2.5)
ALT: 11 U/L (ref 9–46)
AST: 11 U/L (ref 10–35)
Albumin: 4.3 g/dL (ref 3.6–5.1)
Alkaline phosphatase (APISO): 72 U/L (ref 35–144)
BUN: 13 mg/dL (ref 7–25)
CO2: 26 mmol/L (ref 20–32)
Calcium: 9.8 mg/dL (ref 8.6–10.3)
Chloride: 102 mmol/L (ref 98–110)
Creat: 1.05 mg/dL (ref 0.70–1.30)
Globulin: 3.4 g/dL (calc) (ref 1.9–3.7)
Glucose, Bld: 150 mg/dL — ABNORMAL HIGH (ref 65–139)
Potassium: 4.4 mmol/L (ref 3.5–5.3)
Sodium: 139 mmol/L (ref 135–146)
Total Bilirubin: 0.6 mg/dL (ref 0.2–1.2)
Total Protein: 7.7 g/dL (ref 6.1–8.1)
eGFR: 82 mL/min/{1.73_m2} (ref >=60)

## 2021-12-17 LAB — MICROALBUMIN / CREATININE URINE RATIO
Creatinine, Urine: 50 mg/dL (ref 20–320)
Microalb, Ur: <0.2 mg/dL

## 2021-12-17 LAB — PSA: PSA: 4.83 ng/mL — ABNORMAL HIGH (ref <=4.00)

## 2022-01-19 DIAGNOSIS — R972 Elevated prostate specific antigen [PSA]: Secondary | ICD-10-CM | POA: Diagnosis not present

## 2022-01-20 DIAGNOSIS — Z95810 Presence of automatic (implantable) cardiac defibrillator: Secondary | ICD-10-CM | POA: Diagnosis not present

## 2022-01-20 DIAGNOSIS — I428 Other cardiomyopathies: Secondary | ICD-10-CM | POA: Diagnosis not present

## 2022-02-02 DIAGNOSIS — I428 Other cardiomyopathies: Secondary | ICD-10-CM | POA: Diagnosis not present

## 2022-02-02 DIAGNOSIS — Z95810 Presence of automatic (implantable) cardiac defibrillator: Secondary | ICD-10-CM | POA: Diagnosis not present

## 2022-02-02 DIAGNOSIS — I429 Cardiomyopathy, unspecified: Secondary | ICD-10-CM | POA: Diagnosis not present

## 2022-04-04 DIAGNOSIS — I429 Cardiomyopathy, unspecified: Secondary | ICD-10-CM | POA: Diagnosis not present

## 2022-04-28 DIAGNOSIS — I428 Other cardiomyopathies: Secondary | ICD-10-CM | POA: Diagnosis not present

## 2022-04-28 DIAGNOSIS — Z95810 Presence of automatic (implantable) cardiac defibrillator: Secondary | ICD-10-CM | POA: Diagnosis not present

## 2022-05-23 ENCOUNTER — Encounter: Payer: Self-pay | Admitting: Family Medicine

## 2022-05-23 ENCOUNTER — Ambulatory Visit (INDEPENDENT_AMBULATORY_CARE_PROVIDER_SITE_OTHER): Payer: Self-pay | Admitting: Family Medicine

## 2022-05-23 VITALS — BP 127/63 | HR 73 | Ht 67.0 in | Wt 240.0 lb

## 2022-05-23 DIAGNOSIS — K2100 Gastro-esophageal reflux disease with esophagitis, without bleeding: Secondary | ICD-10-CM

## 2022-05-23 DIAGNOSIS — E119 Type 2 diabetes mellitus without complications: Secondary | ICD-10-CM

## 2022-05-23 DIAGNOSIS — K219 Gastro-esophageal reflux disease without esophagitis: Secondary | ICD-10-CM | POA: Insufficient documentation

## 2022-05-23 DIAGNOSIS — M549 Dorsalgia, unspecified: Secondary | ICD-10-CM

## 2022-05-23 DIAGNOSIS — K869 Disease of pancreas, unspecified: Secondary | ICD-10-CM

## 2022-05-23 DIAGNOSIS — K760 Fatty (change of) liver, not elsewhere classified: Secondary | ICD-10-CM

## 2022-05-23 DIAGNOSIS — I5022 Chronic systolic (congestive) heart failure: Secondary | ICD-10-CM

## 2022-05-23 LAB — POCT GLYCOSYLATED HEMOGLOBIN (HGB A1C): Hemoglobin A1C: 6.8 % — AB (ref 4.0–5.6)

## 2022-05-23 NOTE — Assessment & Plan Note (Signed)
Please send referral back to GI so that he can get back into follow-up on the pancreatic lesion.

## 2022-05-23 NOTE — Assessment & Plan Note (Signed)
A1c improved great today at 6.8.  He has gotten back in the gym and has been much more consistent with exercise which is great.  Keep up the good work.  Follow-up in 3 to 4 months.

## 2022-05-23 NOTE — Progress Notes (Addendum)
Established Patient Office Visit  Subjective   Patient ID: Frederick Yang, male    DOB: 1961/01/17  Age: 61 y.o. MRN: [id number]  Chief Complaint  Patient presents with   Diabetes    HPI  Diabetes - no hypoglycemic events. No wounds or sores that are not healing well. No increased thirst or urination. Checking glucose at home. Taking medications as prescribed without any side effects.  He started going back to the gym several weeks ago.  He has been able to work out without feeling exhausted the following day.  He would like to get back in with digestive health to follow-up on the pancreatic cyst.  He has been having just a little bit of discomfort in that left upper quadrant area he is not really painful but sometimes when he takes a deep breath he will just notice like he can feel something there.  Been having some pain in his left low back area.  He says it does not feel quite like a pulled muscle.  It was pretty uncomfortable for about 2 days even tried Celebrex and did not get any relief.  But then it seemed to improve on its own now is just noticing it intermittently or if he lays on his left side.  But it is much more mild.  He is also been noticing that he is just been feeling a little bit more achy in his joints particularly in his hips.     ROS    Objective:     BP 127/63   Pulse 73   Ht '5\' 7"'$  (1.702 m)   Wt 240 lb (108.9 kg)   SpO2 98%   BMI 37.59 kg/m    Physical Exam Constitutional:      Appearance: He is well-developed.  HENT:     Head: Normocephalic and atraumatic.  Cardiovascular:     Rate and Rhythm: Normal rate and regular rhythm.     Heart sounds: Normal heart sounds.  Pulmonary:     Effort: Pulmonary effort is normal.     Breath sounds: Normal breath sounds.  Skin:    General: Skin is warm and dry.  Neurological:     Mental Status: He is alert and oriented to person, place, and time.  Psychiatric:        Behavior: Behavior normal.       Results for orders placed or performed in visit on 05/23/22  POCT glycosylated hemoglobin (Hb A1C)  Result Value Ref Range   Hemoglobin A1C 6.8 (A) 4.0 - 5.6 %   HbA1c POC (<> result, manual entry)     HbA1c, POC (prediabetic range)     HbA1c, POC (controlled diabetic range)    HM DIABETES EYE EXAM  Result Value Ref Range   HM Diabetic Eye Exam No Retinopathy No Retinopathy      The 10-year ASCVD risk score (Arnett DK, et al., 2019) is: 23.7%* (Cholesterol units were assumed)    Assessment & Plan:   Problem List Items Addressed This Visit       Cardiovascular and Mediastinum   Chronic systolic congestive heart failure (HCC)    Stable.  And he has been able to exercise recently without feeling excessively fatigued which is phenomenal!         Digestive   Pancreatic lesion    Please send referral back to GI so that he can get back into follow-up on the pancreatic lesion.      Relevant Orders  Ambulatory referral to Gastroenterology   Hepatic steatosis   Relevant Orders   Ambulatory referral to Gastroenterology   GERD (gastroesophageal reflux disease)    Already on a PPI.  Okay to use 10 minutes intermittently.  If he continues to get that fullness then can address with GI when he makes the appointment to follow-up on the pancreatic lesion.        Endocrine   Diabetes mellitus without complication ([place]) - Primary    A1c improved great today at 6.8.  He has gotten back in the gym and has been much more consistent with exercise which is great.  Keep up the good work.  Follow-up in 3 to 4 months.      Relevant Orders   POCT glycosylated hemoglobin (Hb A1C) (Completed)   Other Visit Diagnoses     Mid back pain on left side          Left mid back pain-unclear etiology.  It sounds a little bit more musculoskeletal though he says it feels different than when he has pulled his back.  But it is gradually getting a little bit better it is below where the kidney  would sit so it is just between the hip and the rib.  And he is not having any red flag symptoms at this point.  Bowels are moving normally and again the pain seems to becoming less frequent and less intense.  So for now recommend monitor for couple more weeks and if not improving then we can work-up further at that time.  Interestingly he did note he had some left-sided sciatica recently and he never had that happen before.  Return in about 4 months (around 09/21/2022) for dm.    Beatrice Lecher, MD

## 2022-05-23 NOTE — Assessment & Plan Note (Addendum)
Stable.  And he has been able to exercise recently without feeling excessively fatigued which is phenomenal!

## 2022-05-23 NOTE — Assessment & Plan Note (Signed)
Already on a PPI.  Okay to use 10 minutes intermittently.  If he continues to get that fullness then can address with GI when he makes the appointment to follow-up on the pancreatic lesion.

## 2022-05-31 ENCOUNTER — Telehealth: Payer: Self-pay

## 2022-05-31 ENCOUNTER — Ambulatory Visit (INDEPENDENT_AMBULATORY_CARE_PROVIDER_SITE_OTHER): Payer: PPO | Admitting: Sports Medicine

## 2022-05-31 ENCOUNTER — Ambulatory Visit (INDEPENDENT_AMBULATORY_CARE_PROVIDER_SITE_OTHER): Payer: PPO

## 2022-05-31 DIAGNOSIS — M51369 Other intervertebral disc degeneration, lumbar region without mention of lumbar back pain or lower extremity pain: Secondary | ICD-10-CM

## 2022-05-31 DIAGNOSIS — M549 Dorsalgia, unspecified: Secondary | ICD-10-CM

## 2022-05-31 DIAGNOSIS — M5136 Other intervertebral disc degeneration, lumbar region: Secondary | ICD-10-CM

## 2022-05-31 DIAGNOSIS — G8929 Other chronic pain: Secondary | ICD-10-CM | POA: Diagnosis not present

## 2022-05-31 DIAGNOSIS — M7542 Impingement syndrome of left shoulder: Secondary | ICD-10-CM | POA: Diagnosis not present

## 2022-05-31 DIAGNOSIS — M25512 Pain in left shoulder: Secondary | ICD-10-CM | POA: Diagnosis not present

## 2022-05-31 DIAGNOSIS — Z950 Presence of cardiac pacemaker: Secondary | ICD-10-CM | POA: Diagnosis not present

## 2022-05-31 DIAGNOSIS — M19012 Primary osteoarthritis, left shoulder: Secondary | ICD-10-CM | POA: Diagnosis not present

## 2022-05-31 DIAGNOSIS — M545 Low back pain, unspecified: Secondary | ICD-10-CM | POA: Diagnosis not present

## 2022-05-31 DIAGNOSIS — M47816 Spondylosis without myelopathy or radiculopathy, lumbar region: Secondary | ICD-10-CM | POA: Diagnosis not present

## 2022-05-31 MED ORDER — PREDNISONE 50 MG PO TABS: ORAL_TABLET | ORAL | 0 refills | Status: DC

## 2022-05-31 NOTE — Assessment & Plan Note (Signed)
Known multilevel lumbar degenerative disc disease from an MRI in 2011, he did see Dr. Rolena Infante for this. Now having increasing left quadratus lumborum pain, worse with sitting, flexion, Valsalva with radiation down the posterior and lateral left hip to the knee but not past. No red flag symptoms. No tenderness over the trochanteric bursa. Adding prednisone, x-rays, home physical therapy, return to see me in 6 weeks, MR for interventional planning if not better.

## 2022-05-31 NOTE — Telephone Encounter (Signed)
Yes, switching it now.

## 2022-05-31 NOTE — Assessment & Plan Note (Signed)
Pleasant 61 year old male, chronic left shoulder impingement syndrome, last injected about a year and a half ago, he did really well until recently, he has been working out and is getting more pain over his deltoid, positive impingement signs on exam, prednisone as above, x-rays, home physical therapy, return to see me in 6 weeks, injection if not better.

## 2022-05-31 NOTE — Telephone Encounter (Signed)
Patient came into office to see if he could get his prednisone sent to [facility] [place] instead of [place] in [employer] being that they have a process, please advise, thanks!

## 2022-05-31 NOTE — Progress Notes (Signed)
    Procedures performed today:    None.  Independent interpretation of notes and tests performed by another provider:   None.  Brief History, Exam, Impression, and Recommendations:    Impingement syndrome, shoulder, left Frederick 61 year old male, chronic left shoulder impingement syndrome, last injected about a year and a half ago, he did really well until recently, he has been working out and is getting more pain over his deltoid, positive impingement signs on exam, prednisone as above, x-rays, home physical therapy, return to see me in 6 weeks, injection if not better.  DDD (degenerative disc disease), lumbar Known multilevel lumbar degenerative disc disease from an MRI in 2011, he did see Dr. Rolena Infante for this. Now having increasing left quadratus lumborum pain, worse with sitting, flexion, Valsalva with radiation down the posterior and lateral left hip to the knee but not past. No red flag symptoms. No tenderness over the trochanteric bursa. Adding prednisone, x-rays, home physical therapy, return to see me in 6 weeks, MR for interventional planning if not better.  Chronic process with exacerbation and pharmacologic intervention  ____________________________________________ Frederick Yang. Frederick Yang, M.D., ABFM., CAQSM., AME. Primary Care and Sports Medicine [facility]  Adjunct Professor of [place] of [employer] of [employer]  Risk manager

## 2022-06-01 DIAGNOSIS — R3912 Poor urinary stream: Secondary | ICD-10-CM | POA: Diagnosis not present

## 2022-06-01 DIAGNOSIS — R972 Elevated prostate specific antigen [PSA]: Secondary | ICD-10-CM | POA: Diagnosis not present

## 2022-06-03 DIAGNOSIS — K219 Gastro-esophageal reflux disease without esophagitis: Secondary | ICD-10-CM | POA: Diagnosis not present

## 2022-06-03 DIAGNOSIS — R1319 Other dysphagia: Secondary | ICD-10-CM | POA: Diagnosis not present

## 2022-06-13 DIAGNOSIS — R1319 Other dysphagia: Secondary | ICD-10-CM | POA: Diagnosis not present

## 2022-06-13 DIAGNOSIS — Z79899 Other long term (current) drug therapy: Secondary | ICD-10-CM | POA: Diagnosis not present

## 2022-06-13 DIAGNOSIS — K219 Gastro-esophageal reflux disease without esophagitis: Secondary | ICD-10-CM | POA: Diagnosis not present

## 2022-06-15 DIAGNOSIS — J101 Influenza due to other identified influenza virus with other respiratory manifestations: Secondary | ICD-10-CM | POA: Diagnosis not present

## 2022-06-15 DIAGNOSIS — Z03818 Encounter for observation for suspected exposure to other biological agents ruled out: Secondary | ICD-10-CM | POA: Diagnosis not present

## 2022-06-15 DIAGNOSIS — R519 Headache, unspecified: Secondary | ICD-10-CM | POA: Diagnosis not present

## 2022-06-15 DIAGNOSIS — R059 Cough, unspecified: Secondary | ICD-10-CM | POA: Diagnosis not present

## 2022-07-11 DIAGNOSIS — I429 Cardiomyopathy, unspecified: Secondary | ICD-10-CM | POA: Diagnosis not present

## 2022-07-13 ENCOUNTER — Ambulatory Visit (INDEPENDENT_AMBULATORY_CARE_PROVIDER_SITE_OTHER): Payer: PPO | Admitting: Sports Medicine

## 2022-07-13 DIAGNOSIS — M7542 Impingement syndrome of left shoulder: Secondary | ICD-10-CM

## 2022-07-13 DIAGNOSIS — M5136 Other intervertebral disc degeneration, lumbar region: Secondary | ICD-10-CM

## 2022-07-13 DIAGNOSIS — M51369 Other intervertebral disc degeneration, lumbar region without mention of lumbar back pain or lower extremity pain: Secondary | ICD-10-CM

## 2022-07-13 NOTE — Assessment & Plan Note (Signed)
Known multilevel lumbar degenerative disc disease from an MRI in 2011, he did use to see Dr. Rolena Infante for this. At the last visit he was having increasing left quadratus lumborum pain worse with sitting, flexion with Valsalva with radiation down the posterior and lateral left hip to the knee but not past, no red flag symptoms, we treated him conservatively, x-rays did show L3-L5 degenerative disc disease, he returns today with resolution of symptoms, he will continue home conditioning indefinitely.

## 2022-07-13 NOTE — Progress Notes (Signed)
    Procedures performed today:    None.  Independent interpretation of notes and tests performed by another provider:   None.  Brief History, Exam, Impression, and Recommendations:    Impingement syndrome, shoulder, left Pleasant 62 year old male, impingement symptoms left shoulder last injected about a year and a half ago, did really well, he was working out had some more pain over the deltoid, impingement signs noted on exam, we had some prednisone, home physical therapy, x-rays which showed glenohumeral osteoarthritis, he returns today with symptoms resolved.  DDD (degenerative disc disease), lumbar Known multilevel lumbar degenerative disc disease from an MRI in 2011, he did use to see Dr. Rolena Infante for this. At the last visit he was having increasing left quadratus lumborum pain worse with sitting, flexion with Valsalva with radiation down the posterior and lateral left hip to the knee but not past, no red flag symptoms, we treated him conservatively, x-rays did show L3-L5 degenerative disc disease, he returns today with resolution of symptoms, he will continue home conditioning indefinitely.    ____________________________________________ Gwen Her. Dianah Field, M.D., ABFM., CAQSM., AME. Primary Care and Sports Medicine [facility]  Adjunct Professor of [place] of [employer] of Medicine  Risk manager

## 2022-07-13 NOTE — Assessment & Plan Note (Signed)
Pleasant 62 year old male, impingement symptoms left shoulder last injected about a year and a half ago, did really well, he was working out had some more pain over the deltoid, impingement signs noted on exam, we had some prednisone, home physical therapy, x-rays which showed glenohumeral osteoarthritis, he returns today with symptoms resolved.

## 2022-07-20 DIAGNOSIS — C61 Malignant neoplasm of prostate: Secondary | ICD-10-CM | POA: Diagnosis not present

## 2022-07-20 DIAGNOSIS — R972 Elevated prostate specific antigen [PSA]: Secondary | ICD-10-CM | POA: Diagnosis not present

## 2022-07-21 DIAGNOSIS — R972 Elevated prostate specific antigen [PSA]: Secondary | ICD-10-CM | POA: Diagnosis not present

## 2022-08-30 DIAGNOSIS — I428 Other cardiomyopathies: Secondary | ICD-10-CM | POA: Diagnosis not present

## 2022-08-30 DIAGNOSIS — Z95810 Presence of automatic (implantable) cardiac defibrillator: Secondary | ICD-10-CM | POA: Diagnosis not present

## 2022-09-21 ENCOUNTER — Ambulatory Visit (INDEPENDENT_AMBULATORY_CARE_PROVIDER_SITE_OTHER): Payer: PPO | Admitting: Family Medicine

## 2022-09-21 VITALS — BP 104/55 | HR 86 | Wt 246.0 lb

## 2022-09-21 DIAGNOSIS — R972 Elevated prostate specific antigen [PSA]: Secondary | ICD-10-CM

## 2022-09-21 DIAGNOSIS — E119 Type 2 diabetes mellitus without complications: Secondary | ICD-10-CM | POA: Diagnosis not present

## 2022-09-21 DIAGNOSIS — Z6838 Body mass index (BMI) 38.0-38.9, adult: Secondary | ICD-10-CM | POA: Diagnosis not present

## 2022-09-21 DIAGNOSIS — I509 Heart failure, unspecified: Secondary | ICD-10-CM

## 2022-09-21 LAB — POCT GLYCOSYLATED HEMOGLOBIN (HGB A1C): Hemoglobin A1C: 6.7 % — AB (ref 4.0–5.6)

## 2022-09-21 NOTE — Assessment & Plan Note (Addendum)
On Inspra, Lasix and Entresto.  Stable. No sigh of volume overload.  Check weight daily.

## 2022-09-21 NOTE — Progress Notes (Signed)
   Established Patient Office Visit  Subjective   Patient ID: Frederick Yang, male    DOB: 11-26-1960  Age: 62 y.o. MRN: [id number]  Chief Complaint  Patient presents with   Diabetes    HPI  Diabetes - no hypoglycemic events. No wounds or sores that are not healing well. No increased thirst or urination. Checking glucose at home. Taking medications as prescribed without any side effects.  Heart failure - doing well. No volume overload.  Doing well. Hasn't been working out at much lately.   Dr. Maryjo Rochester (Urology) are still following PSA Q 6 mo. Last biopsy came back negative.      ROS    Objective:     BP (!) 104/55   Pulse 86   Wt 246 lb (111.6 kg)   SpO2 98%   BMI 38.53 kg/m    Physical Exam Constitutional:      Appearance: He is well-developed.  HENT:     Head: Normocephalic and atraumatic.  Cardiovascular:     Rate and Rhythm: Normal rate and regular rhythm.     Heart sounds: Normal heart sounds.  Pulmonary:     Effort: Pulmonary effort is normal.     Breath sounds: Normal breath sounds.  Skin:    General: Skin is warm and dry.  Neurological:     Mental Status: He is alert and oriented to person, place, and time.  Psychiatric:        Behavior: Behavior normal.      Results for orders placed or performed in visit on 09/21/22  POCT glycosylated hemoglobin (Hb A1C)  Result Value Ref Range   Hemoglobin A1C 6.7 (A) 4.0 - 5.6 %   HbA1c POC (<> result, manual entry)     HbA1c, POC (prediabetic range)     HbA1c, POC (controlled diabetic range)        The 10-year ASCVD risk score (Arnett DK, et al., 2019) is: 17.9%* (Cholesterol units were assumed)    Assessment & Plan:   Problem List Items Addressed This Visit       Cardiovascular and Mediastinum   Congestive heart failure, NYHA class 3 (HCC)    On Inspra, Lasix and Entresto.  Stable. No sigh of volume overload.  Check weight daily.         Endocrine   Diabetes mellitus without complication  ([place]) - Primary    Lab Results  Component Value Date   HGBA1C 6.7 (A) 09/21/2022  At goal., Encouraged moe regular exercise. F/U in 3-4 mo.       Relevant Orders   POCT glycosylated hemoglobin (Hb A1C) (Completed)   COMPLETE METABOLIC PANEL WITH GFR     Other   Elevated PSA, less than 10 ng/ml    Continues to follow with Urology Q 6 mo      BMI 38.0-38.9,adult    Continue to work on health diet and exercise.        Return in about 4 months (around 01/21/2023) for Diabetes follow-up.    Beatrice Lecher, MD

## 2022-09-21 NOTE — Assessment & Plan Note (Signed)
Lab Results  Component Value Date   HGBA1C 6.7 (A) 09/21/2022   At goal., Encouraged moe regular exercise. F/U in 3-4 mo.

## 2022-09-21 NOTE — Assessment & Plan Note (Signed)
Continues to follow with Urology Q 6 mo

## 2022-09-21 NOTE — Assessment & Plan Note (Signed)
Continue to work on health diet and exercise.

## 2022-09-22 LAB — COMPLETE METABOLIC PANEL WITH GFR
AG Ratio: 1.4 (calc) (ref 1.0–2.5)
ALT: 13 U/L (ref 9–46)
AST: 12 U/L (ref 10–35)
Albumin: 4.4 g/dL (ref 3.6–5.1)
Alkaline phosphatase (APISO): 67 U/L (ref 35–144)
BUN: 15 mg/dL (ref 7–25)
CO2: 28 mmol/L (ref 20–32)
Calcium: 9.7 mg/dL (ref 8.6–10.3)
Chloride: 103 mmol/L (ref 98–110)
Creat: 0.79 mg/dL (ref 0.70–1.30)
Globulin: 3.1 g/dL (calc) (ref 1.9–3.7)
Glucose, Bld: 176 mg/dL — ABNORMAL HIGH (ref 65–99)
Potassium: 4.4 mmol/L (ref 3.5–5.3)
Sodium: 141 mmol/L (ref 135–146)
Total Bilirubin: 0.5 mg/dL (ref 0.2–1.2)
Total Protein: 7.5 g/dL (ref 6.1–8.1)
eGFR: 102 mL/min/{1.73_m2} (ref >=60)

## 2022-09-22 NOTE — Progress Notes (Signed)
Your lab work is within acceptable range and there are no concerning findings.   ?

## 2022-10-17 DIAGNOSIS — I429 Cardiomyopathy, unspecified: Secondary | ICD-10-CM | POA: Diagnosis not present

## 2022-11-08 ENCOUNTER — Telehealth: Payer: Self-pay | Admitting: Family Medicine

## 2022-11-08 NOTE — Telephone Encounter (Signed)
Called patient to schedule Medicare Annual Wellness Visit (AWV). Left message for patient to call back and schedule Medicare Annual Wellness Visit (AWV).  Last date of AWV: Never  Please schedule an appointment at any time with Nurse Health advisor.  If any questions, please contact me at [phone].  Thank you ,  Moody Bruins

## 2022-11-25 DIAGNOSIS — R972 Elevated prostate specific antigen [PSA]: Secondary | ICD-10-CM | POA: Diagnosis not present

## 2022-12-13 DIAGNOSIS — Z95810 Presence of automatic (implantable) cardiac defibrillator: Secondary | ICD-10-CM | POA: Diagnosis not present

## 2022-12-13 DIAGNOSIS — I428 Other cardiomyopathies: Secondary | ICD-10-CM | POA: Diagnosis not present

## 2022-12-13 DIAGNOSIS — I5022 Chronic systolic (congestive) heart failure: Secondary | ICD-10-CM | POA: Diagnosis not present

## 2022-12-21 DIAGNOSIS — N401 Enlarged prostate with lower urinary tract symptoms: Secondary | ICD-10-CM | POA: Diagnosis not present

## 2022-12-21 DIAGNOSIS — C61 Malignant neoplasm of prostate: Secondary | ICD-10-CM | POA: Diagnosis not present

## 2022-12-21 DIAGNOSIS — N138 Other obstructive and reflux uropathy: Secondary | ICD-10-CM | POA: Diagnosis not present

## 2023-01-23 ENCOUNTER — Encounter: Payer: Self-pay | Admitting: Family Medicine

## 2023-01-23 ENCOUNTER — Ambulatory Visit (INDEPENDENT_AMBULATORY_CARE_PROVIDER_SITE_OTHER): Payer: PPO | Admitting: Family Medicine

## 2023-01-23 ENCOUNTER — Ambulatory Visit (INDEPENDENT_AMBULATORY_CARE_PROVIDER_SITE_OTHER): Payer: PPO

## 2023-01-23 VITALS — BP 132/71 | HR 77 | Wt 244.0 lb

## 2023-01-23 DIAGNOSIS — G8929 Other chronic pain: Secondary | ICD-10-CM

## 2023-01-23 DIAGNOSIS — M19011 Primary osteoarthritis, right shoulder: Secondary | ICD-10-CM | POA: Diagnosis not present

## 2023-01-23 DIAGNOSIS — E119 Type 2 diabetes mellitus without complications: Secondary | ICD-10-CM | POA: Diagnosis not present

## 2023-01-23 DIAGNOSIS — E785 Hyperlipidemia, unspecified: Secondary | ICD-10-CM | POA: Diagnosis not present

## 2023-01-23 DIAGNOSIS — R972 Elevated prostate specific antigen [PSA]: Secondary | ICD-10-CM

## 2023-01-23 DIAGNOSIS — M25511 Pain in right shoulder: Secondary | ICD-10-CM

## 2023-01-23 DIAGNOSIS — M542 Cervicalgia: Secondary | ICD-10-CM | POA: Diagnosis not present

## 2023-01-23 LAB — POCT GLYCOSYLATED HEMOGLOBIN (HGB A1C): Hemoglobin A1C: 6.8 % — AB (ref 4.0–5.6)

## 2023-01-23 MED ORDER — EMPAGLIFLOZIN 25 MG PO TABS: 25.0000 mg | ORAL_TABLET | Freq: Every day | ORAL | 3 refills | Status: DC

## 2023-01-23 MED ORDER — METFORMIN HCL ER 500 MG PO TB24
500.0000 mg | ORAL_TABLET | Freq: Every day | ORAL | 3 refills | Status: AC
Start: 2023-01-23 — End: ?

## 2023-01-23 MED ORDER — EPLERENONE 25 MG PO TABS: 25.0000 mg | ORAL_TABLET | Freq: Every day | ORAL | 3 refills | Status: DC

## 2023-01-23 MED ORDER — ATORVASTATIN CALCIUM 20 MG PO TABS: 20.0000 mg | ORAL_TABLET | Freq: Every day | ORAL | 3 refills | Status: DC

## 2023-01-23 MED ORDER — CARVEDILOL 12.5 MG PO TABS: 12.5000 mg | ORAL_TABLET | Freq: Two times a day (BID) | ORAL | 3 refills | Status: DC

## 2023-01-23 NOTE — Progress Notes (Addendum)
Established Patient Office Visit  Subjective   Patient ID: Frederick Yang, male    DOB: 01-02-1961  Age: 62 y.o. MRN: [id number]  Chief Complaint  Patient presents with   Diabetes    HPI Diabetes - no hypoglycemic events. No wounds or sores that are not healing well. No increased thirst or urination. Checking glucose at home. Taking medications as prescribed without any side effects.  Follow-up heart failure-overall doing really well.  EF is up to 40% and when he was first diagnosed it was around 15% so has been doing really well on his current regimen.  Does need some refills on some of the medications but not the Entresto currently.  He is also been dealing with some right shoulder pain.  It occasionally radiates up the sternocleidomastoid and towards his neck over the trapezius area as well.  Sometimes pain radiates down into the upper arm sometimes a little bit anterior posteriorly.  He has been doing formal PT for about a month and it is a little bit better but just not tremendously so.  So wanted to let me know that he had a slight bump in his PSA.  Urology is keeping a tight eye on this as his prior biopsies were negative.  They are not really worried at this point unless the PSA jumped up to 10.  He also noticed recently that weight was up just slightly but it seems to be coming back down he thinks it might have been just a little bit of fluid retention but feels like he is getting back to baseline.  ROS    Objective:     BP 132/71   Pulse 77   Wt 244 lb (110.7 kg)   SpO2 98%   BMI 38.22 kg/m    Physical Exam Constitutional:      Appearance: He is well-developed.  HENT:     Head: Normocephalic and atraumatic.  Cardiovascular:     Rate and Rhythm: Normal rate and regular rhythm.     Heart sounds: Normal heart sounds.  Pulmonary:     Effort: Pulmonary effort is normal.     Breath sounds: Normal breath sounds.  Skin:    General: Skin is warm and dry.   Neurological:     Mental Status: He is alert and oriented to person, place, and time.  Psychiatric:        Behavior: Behavior normal.      Results for orders placed or performed in visit on 01/23/23  POCT glycosylated hemoglobin (Hb A1C)  Result Value Ref Range   Hemoglobin A1C 6.8 (A) 4.0 - 5.6 %   HbA1c POC (<> result, manual entry)     HbA1c, POC (prediabetic range)     HbA1c, POC (controlled diabetic range)        The 10-year ASCVD risk score (Arnett DK, et al., 2019) is: 26.1%    Assessment & Plan:   Problem List Items Addressed This Visit       Endocrine   Diabetes mellitus without complication (HCC) - Primary   Relevant Medications   atorvastatin (LIPITOR) 20 MG tablet   empagliflozin (JARDIANCE) 25 MG TABS tablet   metFORMIN (GLUCOPHAGE-XR) 500 MG 24 hr tablet   Other Relevant Orders   POCT glycosylated hemoglobin (Hb A1C) (Completed)   Lipid Panel With LDL/HDL Ratio   Urine Microalbumin w/creat. ratio   PSA     Other   Hyperlipidemia   Relevant Medications   atorvastatin (LIPITOR) 20 MG  tablet   carvedilol (COREG) 12.5 MG tablet   eplerenone (INSPRA) 25 MG tablet   Other Relevant Orders   POCT glycosylated hemoglobin (Hb A1C) (Completed)   Lipid Panel With LDL/HDL Ratio   Urine Microalbumin w/creat. ratio   PSA   Elevated PSA, less than 10 ng/ml   Relevant Orders   POCT glycosylated hemoglobin (Hb A1C) (Completed)   Lipid Panel With LDL/HDL Ratio   Urine Microalbumin w/creat. ratio   PSA   Other Visit Diagnoses     Chronic right shoulder pain       Relevant Orders   DG Shoulder Right      Right shoulder pain-will get a plain film today.  If not continuing to improve over the next several weeks consider MRI for further workup.   Return in about 4 months (around 05/26/2023) for Diabetes follow-up.    Nani Gasser, MD

## 2023-01-24 DIAGNOSIS — R972 Elevated prostate specific antigen [PSA]: Secondary | ICD-10-CM | POA: Diagnosis not present

## 2023-01-24 DIAGNOSIS — E785 Hyperlipidemia, unspecified: Secondary | ICD-10-CM | POA: Diagnosis not present

## 2023-01-24 DIAGNOSIS — E119 Type 2 diabetes mellitus without complications: Secondary | ICD-10-CM | POA: Diagnosis not present

## 2023-01-25 NOTE — Progress Notes (Signed)
Hi Frederick Yang, LDL cholesterol is up a little bit at 122, goal is less than 100.  Triglycerides are up just a little as well.  Based on this information I would recommend increasing your atorvastatin to 40 mg.  We really need to get that LDL under 100.  If you are okay with this let us know and I will send over an updated prescription to your pharmacy your.  Your PSA level was 5.9.

## 2023-01-26 MED ORDER — ATORVASTATIN CALCIUM 40 MG PO TABS
40.0000 mg | ORAL_TABLET | Freq: Every day | ORAL | 3 refills | Status: AC
Start: 2023-01-26 — End: ?

## 2023-01-26 NOTE — Addendum Note (Signed)
Addended by: Nani Gasser D on: 01/26/2023 02:29 PM   Modules accepted: Orders

## 2023-01-26 NOTE — Progress Notes (Signed)
      atorvastatin (LIPITOR) 40 MG tablet         Sig: Take 1 tablet (40 mg total) by mouth at bedtime.         Dispense:  90 tablet         Refill:  3

## 2023-01-30 ENCOUNTER — Telehealth: Payer: Self-pay | Admitting: Family Medicine

## 2023-01-30 NOTE — Telephone Encounter (Signed)
Pt returned your call.  

## 2023-01-30 NOTE — Progress Notes (Signed)
HI Frederick Yang, x-ray of your shoulder shows some cystic change in the bone at the lateral humeral head.  This is often seen in people who are having issues with the rotator cuff.  Also some arthritis in the acromioclavicular joint.  You have already been doing physical therapy for about 6 weeks.  We could try to go ahead and move forward with MRI for further workup or consider getting you in with sports med.  Let me know if you have a preference.

## 2023-02-02 NOTE — Telephone Encounter (Signed)
Spoke with patient.

## 2023-02-07 ENCOUNTER — Other Ambulatory Visit: Payer: Self-pay | Admitting: *Deleted

## 2023-02-07 ENCOUNTER — Ambulatory Visit (INDEPENDENT_AMBULATORY_CARE_PROVIDER_SITE_OTHER): Payer: PPO

## 2023-02-07 ENCOUNTER — Ambulatory Visit (INDEPENDENT_AMBULATORY_CARE_PROVIDER_SITE_OTHER): Payer: PPO | Admitting: Sports Medicine

## 2023-02-07 DIAGNOSIS — M47812 Spondylosis without myelopathy or radiculopathy, cervical region: Secondary | ICD-10-CM | POA: Diagnosis not present

## 2023-02-07 DIAGNOSIS — M5030 Other cervical disc degeneration, unspecified cervical region: Secondary | ICD-10-CM

## 2023-02-07 DIAGNOSIS — M542 Cervicalgia: Secondary | ICD-10-CM | POA: Diagnosis not present

## 2023-02-07 MED ORDER — TAMSULOSIN HCL 0.4 MG PO CAPS: 0.4000 mg | ORAL_CAPSULE | Freq: Every day | ORAL | 3 refills | Status: AC

## 2023-02-07 MED ORDER — FUROSEMIDE 40 MG PO TABS: 40.0000 mg | ORAL_TABLET | Freq: Every day | ORAL | 0 refills | Status: DC

## 2023-02-07 MED ORDER — OMEPRAZOLE 20 MG PO CPDR: 20.0000 mg | DELAYED_RELEASE_CAPSULE | Freq: Every day | ORAL | 3 refills | Status: DC

## 2023-02-07 MED ORDER — PREDNISONE 50 MG PO TABS: ORAL_TABLET | ORAL | 0 refills | Status: DC

## 2023-02-07 MED ORDER — CARVEDILOL 25 MG PO TABS: ORAL_TABLET | ORAL | 1 refills | Status: DC

## 2023-02-07 NOTE — Assessment & Plan Note (Signed)
This pleasant 62 year old male returns, he has been complaining of some right sided neck and upper shoulder pain, this occurred for the last 2 months. I did treat this approximately 2 years ago, this resolved with a burst of prednisone. Since then he has been treated for his shoulder, x-rays did show some insertional rotator cuff degenerative changes, he has done some rotator cuff physical therapy but nothing for his neck. Unfortunately continues to have worsening pain. He localized the pain right trapezius with radiation over the right deltoid, no paresthesias in the right upper extremity. His rotator cuff, labral and glenohumeral exam is unrevealing, he does have a positive Spurling sign on the right, this created concordant pain suggesting that his pain generator is his cervical spine Adding 5 days of prednisone, updated C-spine x-rays, C-spine home PT, return to see me in 6 weeks, MRI if no better, he is profoundly claustrophobic and will likely need 3 triazolam pills for sedation prior to his MRI.

## 2023-02-07 NOTE — Progress Notes (Signed)
    Procedures performed today:    None.  Independent interpretation of notes and tests performed by another provider:   None.  Brief History, Exam, Impression, and Recommendations:    DDD (degenerative disc disease), cervical This pleasant 62 year old male returns, he has been complaining of some right sided neck and upper shoulder pain, this occurred for the last 2 months. I did treat this approximately 2 years ago, this resolved with a burst of prednisone. Since then he has been treated for his shoulder, x-rays did show some insertional rotator cuff degenerative changes, he has done some rotator cuff physical therapy but nothing for his neck. Unfortunately continues to have worsening pain. He localized the pain right trapezius with radiation over the right deltoid, no paresthesias in the right upper extremity. His rotator cuff, labral and glenohumeral exam is unrevealing, he does have a positive Spurling sign on the right, this created concordant pain suggesting that his pain generator is his cervical spine Adding 5 days of prednisone, updated C-spine x-rays, C-spine home PT, return to see me in 6 weeks, MRI if no better, he is profoundly claustrophobic and will likely need 3 triazolam pills for sedation prior to his MRI.    ____________________________________________ Ihor Austin. Benjamin Stain, M.D., ABFM., CAQSM., AME. Primary Care and Sports Medicine [facility]  Adjunct Professor of Family Medicine  [employer] [facility] [employer] [employer]  Restaurant manager, fast food

## 2023-02-08 DIAGNOSIS — I428 Other cardiomyopathies: Secondary | ICD-10-CM | POA: Diagnosis not present

## 2023-02-08 DIAGNOSIS — Z95810 Presence of automatic (implantable) cardiac defibrillator: Secondary | ICD-10-CM | POA: Diagnosis not present

## 2023-02-08 DIAGNOSIS — I429 Cardiomyopathy, unspecified: Secondary | ICD-10-CM | POA: Diagnosis not present

## 2023-04-13 DIAGNOSIS — Z95810 Presence of automatic (implantable) cardiac defibrillator: Secondary | ICD-10-CM | POA: Diagnosis not present

## 2023-04-13 DIAGNOSIS — I5022 Chronic systolic (congestive) heart failure: Secondary | ICD-10-CM | POA: Diagnosis not present

## 2023-04-13 DIAGNOSIS — I428 Other cardiomyopathies: Secondary | ICD-10-CM | POA: Diagnosis not present

## 2023-04-17 DIAGNOSIS — N138 Other obstructive and reflux uropathy: Secondary | ICD-10-CM | POA: Diagnosis not present

## 2023-04-17 DIAGNOSIS — N401 Enlarged prostate with lower urinary tract symptoms: Secondary | ICD-10-CM | POA: Diagnosis not present

## 2023-04-24 DIAGNOSIS — C61 Malignant neoplasm of prostate: Secondary | ICD-10-CM | POA: Diagnosis not present

## 2023-04-24 DIAGNOSIS — Z95810 Presence of automatic (implantable) cardiac defibrillator: Secondary | ICD-10-CM | POA: Diagnosis not present

## 2023-04-24 DIAGNOSIS — N401 Enlarged prostate with lower urinary tract symptoms: Secondary | ICD-10-CM | POA: Diagnosis not present

## 2023-04-24 DIAGNOSIS — I429 Cardiomyopathy, unspecified: Secondary | ICD-10-CM | POA: Diagnosis not present

## 2023-04-24 DIAGNOSIS — N138 Other obstructive and reflux uropathy: Secondary | ICD-10-CM | POA: Diagnosis not present

## 2023-04-24 DIAGNOSIS — R3912 Poor urinary stream: Secondary | ICD-10-CM | POA: Diagnosis not present

## 2023-05-05 ENCOUNTER — Other Ambulatory Visit: Payer: Self-pay | Admitting: Family Medicine

## 2023-05-24 ENCOUNTER — Encounter: Payer: Self-pay | Admitting: Family Medicine

## 2023-05-24 NOTE — Telephone Encounter (Signed)
Care team updated and letter sent for eye exam notes.

## 2023-06-05 ENCOUNTER — Ambulatory Visit (INDEPENDENT_AMBULATORY_CARE_PROVIDER_SITE_OTHER): Payer: PPO | Admitting: Family Medicine

## 2023-06-05 ENCOUNTER — Encounter: Payer: Self-pay | Admitting: Family Medicine

## 2023-06-05 VITALS — BP 98/62 | HR 77 | Ht 67.0 in | Wt 252.0 lb

## 2023-06-05 DIAGNOSIS — E119 Type 2 diabetes mellitus without complications: Secondary | ICD-10-CM

## 2023-06-05 DIAGNOSIS — E1165 Type 2 diabetes mellitus with hyperglycemia: Secondary | ICD-10-CM | POA: Diagnosis not present

## 2023-06-05 DIAGNOSIS — Z7984 Long term (current) use of oral hypoglycemic drugs: Secondary | ICD-10-CM | POA: Diagnosis not present

## 2023-06-05 DIAGNOSIS — I5022 Chronic systolic (congestive) heart failure: Secondary | ICD-10-CM | POA: Diagnosis not present

## 2023-06-05 LAB — POCT GLYCOSYLATED HEMOGLOBIN (HGB A1C): Hemoglobin A1C: 7.2 % — AB (ref 4.0–5.6)

## 2023-06-05 NOTE — Assessment & Plan Note (Signed)
 Stable. No sign of volume overload.

## 2023-06-05 NOTE — Assessment & Plan Note (Signed)
A1C is up to 7.2.  He says has been really busy and on the go a lot lately so he has been eating out a little bit more often.  We discussed getting back on track with diet and exercise.  His last A1c was under 7 to try to see if we can get back to goal without adjusting the medication.  If not improving then could consider addition of GLP-1.  Or adjusting the metformin.

## 2023-06-05 NOTE — Progress Notes (Signed)
   Established Patient Office Visit  Subjective   Patient ID: Frederick Yang, male    DOB: Mar 13, 1961  Age: 62 y.o. MRN: [id number]  Chief Complaint  Patient presents with   Diabetes    HPI  Diabetes - no hypoglycemic events. No wounds or sores that are not healing well. No increased thirst or urination. Checking glucose at home. Taking medications as prescribed without any side effects.  CHF - has been stable for almost 2 years.  BPs have been a little more low the last couple of months. Will address with Cardiology. Asymptomatic when BP is low thus far.     ROS    Objective:     BP 98/62   Pulse 77   Ht 5\' 7"  (1.702 m)   Wt 252 lb (114.3 kg)   SpO2 97%   BMI 39.47 kg/m    Physical Exam Vitals and nursing note reviewed.  Constitutional:      Appearance: Normal appearance.  HENT:     Head: Normocephalic and atraumatic.  Eyes:     Conjunctiva/sclera: Conjunctivae normal.  Cardiovascular:     Rate and Rhythm: Normal rate and regular rhythm.  Pulmonary:     Effort: Pulmonary effort is normal.     Breath sounds: Normal breath sounds.  Skin:    General: Skin is warm and dry.  Neurological:     Mental Status: He is alert.  Psychiatric:        Mood and Affect: Mood normal.      Results for orders placed or performed in visit on 06/05/23  POCT HgB A1C  Result Value Ref Range   Hemoglobin A1C 7.2 (A) 4.0 - 5.6 %   HbA1c POC (<> result, manual entry)     HbA1c, POC (prediabetic range)     HbA1c, POC (controlled diabetic range)        The 10-year ASCVD risk score (Arnett DK, et al., 2019) is: 16.5%    Assessment & Plan:   Problem List Items Addressed This Visit       Cardiovascular and Mediastinum   Chronic systolic congestive heart failure (HCC)    Stable. No sign of volume overload        Endocrine   Uncontrolled type 2 diabetes mellitus with hyperglycemia (HCC)   Diabetes mellitus without complication (HCC) - Primary    A1C is up to 7.2.   He says has been really busy and on the go a lot lately so he has been eating out a little bit more often.  We discussed getting back on track with diet and exercise.  His last A1c was under 7 to try to see if we can get back to goal without adjusting the medication.  If not improving then could consider addition of GLP-1.  Or adjusting the metformin.      Relevant Orders   POCT HgB A1C (Completed)   CMP14+EGFR     Other   Severe obesity (BMI 35.0-39.9) with comorbidity (HCC)    Continue to work on healthy diet and regular exercise.  He is up about 8 pounds since July just really encouraged him to continue to work on healthy diet and regular exercise.       Return in about 13 weeks (around 09/04/2023) for Diabetes follow-up.    Nani Gasser, MD

## 2023-06-05 NOTE — Assessment & Plan Note (Signed)
Continue to work on healthy diet and regular exercise.  He is up about 8 pounds since July just really encouraged him to continue to work on healthy diet and regular exercise.

## 2023-06-06 LAB — CMP14+EGFR
ALT: 15 [IU]/L (ref 0–44)
AST: 14 [IU]/L (ref 0–40)
Albumin: 4.3 g/dL (ref 3.8–4.9)
Alkaline Phosphatase: 83 [IU]/L (ref 44–121)
BUN/Creatinine Ratio: 14 (ref 10–24)
BUN: 12 mg/dL (ref 8–27)
Bilirubin Total: 0.4 mg/dL (ref 0.0–1.2)
CO2: 23 mmol/L (ref 20–29)
Calcium: 9.2 mg/dL (ref 8.6–10.2)
Chloride: 103 mmol/L (ref 96–106)
Creatinine, Ser: 0.88 mg/dL (ref 0.76–1.27)
Globulin, Total: 2.7 g/dL (ref 1.5–4.5)
Glucose: 195 mg/dL — ABNORMAL HIGH (ref 70–99)
Potassium: 4.3 mmol/L (ref 3.5–5.2)
Sodium: 142 mmol/L (ref 134–144)
Total Protein: 7 g/dL (ref 6.0–8.5)
eGFR: 98 mL/min/{1.73_m2} (ref >59)

## 2023-06-06 NOTE — Progress Notes (Signed)
Your lab work is within acceptable range and there are no concerning findings.   ?

## 2023-07-19 ENCOUNTER — Other Ambulatory Visit: Payer: Self-pay | Admitting: Family Medicine

## 2023-07-24 DIAGNOSIS — Z95810 Presence of automatic (implantable) cardiac defibrillator: Secondary | ICD-10-CM | POA: Diagnosis not present

## 2023-07-24 DIAGNOSIS — I429 Cardiomyopathy, unspecified: Secondary | ICD-10-CM | POA: Diagnosis not present

## 2023-08-14 DIAGNOSIS — I428 Other cardiomyopathies: Secondary | ICD-10-CM | POA: Diagnosis not present

## 2023-08-14 DIAGNOSIS — Z95810 Presence of automatic (implantable) cardiac defibrillator: Secondary | ICD-10-CM | POA: Diagnosis not present

## 2023-08-14 DIAGNOSIS — I5022 Chronic systolic (congestive) heart failure: Secondary | ICD-10-CM | POA: Diagnosis not present

## 2023-08-14 DIAGNOSIS — Z1330 Encounter for screening examination for mental health and behavioral disorders, unspecified: Secondary | ICD-10-CM | POA: Diagnosis not present

## 2023-09-04 ENCOUNTER — Ambulatory Visit: Payer: PPO | Admitting: Family Medicine

## 2023-10-23 DIAGNOSIS — I429 Cardiomyopathy, unspecified: Secondary | ICD-10-CM | POA: Diagnosis not present

## 2023-10-23 DIAGNOSIS — Z95810 Presence of automatic (implantable) cardiac defibrillator: Secondary | ICD-10-CM | POA: Diagnosis not present

## 2023-10-24 ENCOUNTER — Ambulatory Visit (INDEPENDENT_AMBULATORY_CARE_PROVIDER_SITE_OTHER): Admitting: Family Medicine

## 2023-10-24 ENCOUNTER — Encounter: Payer: Self-pay | Admitting: Family Medicine

## 2023-10-24 VITALS — BP 119/58 | HR 65 | Ht 67.0 in | Wt 250.0 lb

## 2023-10-24 DIAGNOSIS — Z7984 Long term (current) use of oral hypoglycemic drugs: Secondary | ICD-10-CM

## 2023-10-24 DIAGNOSIS — E785 Hyperlipidemia, unspecified: Secondary | ICD-10-CM | POA: Diagnosis not present

## 2023-10-24 DIAGNOSIS — E119 Type 2 diabetes mellitus without complications: Secondary | ICD-10-CM | POA: Diagnosis not present

## 2023-10-24 DIAGNOSIS — I509 Heart failure, unspecified: Secondary | ICD-10-CM

## 2023-10-24 LAB — POCT GLYCOSYLATED HEMOGLOBIN (HGB A1C): Hemoglobin A1C: 7.1 % — AB (ref 4.0–5.6)

## 2023-10-24 NOTE — Progress Notes (Signed)
 Established Patient Office Visit  Subjective  Patient ID: Frederick Yang, male    DOB: Jul 27, 1960  Age: 63 y.o. MRN: [id number]  Chief Complaint  Patient presents with   Diabetes    HPI   Diabetes - no hypoglycemic events. No wounds or sores that are not healing well. No increased thirst or urination. Checking glucose at home. Taking medications as prescribed without any side effects.  He admits he probably could be doing better with his diet.  He still eats a few sweet treats even though overall he is cut back on those.  He has been doing a little bit more yard work lately and trying to get more active.  F/U Heart failure - still monitoring weights regularly. No volume ovrlaod. Now just on lasix  PRN.  Hasn't had to use it lately.  He has been really stable on his current medication regimen.  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  He says most days he actually handles it well occasionally he might have a day where he feels down but he feels like he is coping well at this point.  I did offer for him to just let me know if at any point he feels like it would be helpful to work with a therapist or counselor or have some additional support.  It can be very challenging to be a caregiver especially for multiple family members.     ROS    Objective:     BP (!) 119/58   Pulse 65   Ht 5\' 7"  (1.702 m)   Wt 250 lb (113.4 kg)   SpO2 98%   BMI 39.16 kg/m    Physical Exam Vitals and nursing note reviewed.  Constitutional:      Appearance: Normal appearance.  HENT:     Head: Normocephalic and atraumatic.  Eyes:     Conjunctiva/sclera: Conjunctivae normal.  Cardiovascular:     Rate and Rhythm: Normal rate and regular rhythm.  Pulmonary:     Effort: Pulmonary effort is normal.     Breath sounds: Normal breath sounds.  Skin:    General: Skin is warm and dry.  Neurological:     Mental Status: He is alert.  Psychiatric:         Mood and Affect: Mood normal.      Results for orders placed or performed in visit on 10/24/23  POCT HgB A1C  Result Value Ref Range   Hemoglobin A1C 7.1 (A) 4.0 - 5.6 %   HbA1c POC (<> result, manual entry)     HbA1c, POC (prediabetic range)     HbA1c, POC (controlled diabetic range)        The 10-year ASCVD risk score (Arnett DK, et al., 2019) is: 13.8%    Assessment & Plan:   Problem List Items Addressed This Visit       Cardiovascular and Mediastinum   Congestive heart failure, NYHA class 3 (HCC)   No sign of volume overload on exam.  Weight is actually pretty stable.  Continue Jardiance , Inspra , Entresto  and carvedilol .        Endocrine   Diabetes mellitus without complication (HCC) - Primary   A1c see is still elevated but slightly improved.  We discussed possibly increasing the metformin  to twice a day and continuing the Jardiance  but he really wants to work on his diet first.  He has been more active in the last several weeks as well  and so that should help as well continue to work on healthy diet and weight loss.      Relevant Orders   POCT HgB A1C (Completed)     Other   Hyperlipidemia   Currently on atorvastatin .  For repeat labs in July.  So at next visit we should be able to get those updated.  Lipid Panel     Component Value Date/Time   CHOL 196 01/24/2023 1045   TRIG 186 (H) 01/24/2023 1045   HDL 41 01/24/2023 1045   CHOLHDL 4.1 12/16/2021 0000   VLDL 62 (H) 04/18/2014 0927   LDLCALC 122 (H) 01/24/2023 1045   LDLCALC 99 12/16/2021 0000   LABVLDL 33 01/24/2023 1045          Eyeexams done at the [place] he is scheduled for August.  Return in about 3 months (around 01/23/2024) for Diabetes follow-up.    Duaine German, MD

## 2023-10-24 NOTE — Assessment & Plan Note (Signed)
 Currently on atorvastatin .  For repeat labs in July.  So at next visit we should be able to get those updated.  Lipid Panel     Component Value Date/Time   CHOL 196 01/24/2023 1045   TRIG 186 (H) 01/24/2023 1045   HDL 41 01/24/2023 1045   CHOLHDL 4.1 12/16/2021 0000   VLDL 62 (H) 04/18/2014 0927   LDLCALC 122 (H) 01/24/2023 1045   LDLCALC 99 12/16/2021 0000   LABVLDL 33 01/24/2023 1045

## 2023-10-24 NOTE — Assessment & Plan Note (Signed)
 A1c see is still elevated but slightly improved.  We discussed possibly increasing the metformin  to twice a day and continuing the Jardiance  but he really wants to work on his diet first.  He has been more active in the last several weeks as well and so that should help as well continue to work on healthy diet and weight loss.

## 2023-10-24 NOTE — Assessment & Plan Note (Signed)
 No sign of volume overload on exam.  Weight is actually pretty stable.  Continue Jardiance , Inspra , Entresto  and carvedilol .

## 2023-11-13 DIAGNOSIS — I5022 Chronic systolic (congestive) heart failure: Secondary | ICD-10-CM | POA: Diagnosis not present

## 2023-11-13 DIAGNOSIS — Z95810 Presence of automatic (implantable) cardiac defibrillator: Secondary | ICD-10-CM | POA: Diagnosis not present

## 2023-11-13 DIAGNOSIS — I428 Other cardiomyopathies: Secondary | ICD-10-CM | POA: Diagnosis not present

## 2023-12-13 ENCOUNTER — Other Ambulatory Visit: Payer: Self-pay | Admitting: Family Medicine

## 2023-12-13 DIAGNOSIS — E119 Type 2 diabetes mellitus without complications: Secondary | ICD-10-CM

## 2023-12-13 DIAGNOSIS — E785 Hyperlipidemia, unspecified: Secondary | ICD-10-CM

## 2023-12-19 ENCOUNTER — Ambulatory Visit (INDEPENDENT_AMBULATORY_CARE_PROVIDER_SITE_OTHER): Admitting: Sports Medicine

## 2023-12-19 ENCOUNTER — Ambulatory Visit

## 2023-12-19 DIAGNOSIS — M25552 Pain in left hip: Secondary | ICD-10-CM

## 2023-12-19 DIAGNOSIS — M1612 Unilateral primary osteoarthritis, left hip: Secondary | ICD-10-CM | POA: Insufficient documentation

## 2023-12-19 DIAGNOSIS — G8929 Other chronic pain: Secondary | ICD-10-CM | POA: Diagnosis not present

## 2023-12-19 DIAGNOSIS — M160 Bilateral primary osteoarthritis of hip: Secondary | ICD-10-CM | POA: Diagnosis not present

## 2023-12-19 MED ORDER — ACETAMINOPHEN ER 650 MG PO TBCR: 650.0000 mg | EXTENDED_RELEASE_TABLET | Freq: Three times a day (TID) | ORAL | Status: AC | PRN

## 2023-12-19 NOTE — Assessment & Plan Note (Signed)
 Very pleasant 63 year old male, increasing pain left lateral and anterior hip with popping, moderate gelling, pain on internal rotation, no pain with resisted hip flexion, suspect hip osteoarthritis, he does have heart failure with an ejection fraction in the 30s so we will avoid NSAIDs, adding arthritis Tylenol , x-rays, home PT, return in 6 weeks, injection if not better.

## 2023-12-19 NOTE — Progress Notes (Signed)
    Procedures performed today:    None.  Independent interpretation of notes and tests performed by another provider:   None.  Brief History, Exam, Impression, and Recommendations:    Chronic left hip pain Very pleasant 63 year old male, increasing pain left lateral and anterior hip with popping, moderate gelling, pain on internal rotation, no pain with resisted hip flexion, suspect hip osteoarthritis, he does have heart failure with an ejection fraction in the 30s so we will avoid NSAIDs, adding arthritis Tylenol , x-rays, home PT, return in 6 weeks, injection if not better.    ____________________________________________ Debby PARAS. Curtis, M.D., ABFM., CAQSM., AME. Primary Care and Sports Medicine [facility]  Adjunct Professor of [facility] Medicine  [employer]  Restaurant manager, fast food

## 2023-12-19 NOTE — Progress Notes (Signed)
 SABRA

## 2023-12-26 ENCOUNTER — Ambulatory Visit: Payer: Self-pay | Admitting: Sports Medicine

## 2024-01-12 LAB — HM DIABETES EYE EXAM

## 2024-01-22 DIAGNOSIS — I429 Cardiomyopathy, unspecified: Secondary | ICD-10-CM | POA: Diagnosis not present

## 2024-01-22 DIAGNOSIS — Z95810 Presence of automatic (implantable) cardiac defibrillator: Secondary | ICD-10-CM | POA: Diagnosis not present

## 2024-01-23 ENCOUNTER — Ambulatory Visit: Admitting: Family Medicine

## 2024-01-25 ENCOUNTER — Ambulatory Visit: Admitting: Family Medicine

## 2024-01-25 ENCOUNTER — Encounter: Payer: Self-pay | Admitting: Family Medicine

## 2024-01-25 VITALS — BP 123/69 | HR 61 | Ht 67.0 in | Wt 245.1 lb

## 2024-01-25 DIAGNOSIS — I5022 Chronic systolic (congestive) heart failure: Secondary | ICD-10-CM

## 2024-01-25 DIAGNOSIS — R208 Other disturbances of skin sensation: Secondary | ICD-10-CM

## 2024-01-25 DIAGNOSIS — E119 Type 2 diabetes mellitus without complications: Secondary | ICD-10-CM

## 2024-01-25 DIAGNOSIS — Z7984 Long term (current) use of oral hypoglycemic drugs: Secondary | ICD-10-CM

## 2024-01-25 DIAGNOSIS — R972 Elevated prostate specific antigen [PSA]: Secondary | ICD-10-CM | POA: Diagnosis not present

## 2024-01-25 LAB — POCT GLYCOSYLATED HEMOGLOBIN (HGB A1C): Hemoglobin A1C: 6.8 % — AB (ref 4.0–5.6)

## 2024-01-25 NOTE — Progress Notes (Signed)
 Established Patient Office Visit  Subjective  Patient ID: Frederick Yang, male    DOB: 22-Feb-1961  Age: 63 y.o. MRN: [id number]  Chief Complaint  Patient presents with   Diabetes    Eye exam was performed last week at [facility].([id number]) [phone]    HPI  Diabetes - no hypoglycemic events. No wounds or sores that are not healing well. No increased thirst or urination. Checking glucose at home. Taking medications as prescribed without any side effects.  States pretty active during the summer gets outside and sweats a lot.  In the wintertime he settle probably be back in the gym.  He says he has not done the best with his diet he knows there is some room for improvement.   Eye exam was performed last week at [phone]) [phone]   C/o of burning toe pain on the right foot.  He says that it hung around for about 3 months but then eventually it just went away on its own it has not come back so far.  He just wanted to mention it he said he never noticed any skin changes but it was the sensation almost like his shoe was rubbing but there was nothing there.  He has been dealing with some back pain and has been working with Dr. ONEIDA on that.    ROS    Objective:     BP 123/69   Pulse 61   Ht 5' 7 (1.702 m)   Wt 245 lb 0.8 oz (111.2 kg)   SpO2 99%   BMI 38.38 kg/m    Physical Exam Vitals and nursing note reviewed.  Constitutional:      Appearance: Normal appearance.  HENT:     Head: Normocephalic and atraumatic.  Eyes:     Conjunctiva/sclera: Conjunctivae normal.  Cardiovascular:     Rate and Rhythm: Normal rate and regular rhythm.  Pulmonary:     Effort: Pulmonary effort is normal.     Breath sounds: Normal breath sounds.  Skin:    General: Skin is warm and dry.  Neurological:     Mental Status: He is alert.  Psychiatric:        Mood and Affect: Mood normal.      Results for orders placed or performed in visit on 01/25/24  POCT HgB  A1C  Result Value Ref Range   Hemoglobin A1C 6.8 (A) 4.0 - 5.6 %   HbA1c POC (<> result, manual entry)     HbA1c, POC (prediabetic range)     HbA1c, POC (controlled diabetic range)        The 10-year ASCVD risk score (Arnett DK, et al., 2019) is: 23.8%    Assessment & Plan:   Problem List Items Addressed This Visit       Cardiovascular and Mediastinum   Chronic systolic congestive heart failure (HCC)   BP at goal.  Weight is down 5 lbs. On Jardiance ,half tab daily. Still working on diet. No volume overload.       Relevant Orders   Lipid Panel With LDL/HDL Ratio   PSA     Endocrine   Diabetes mellitus without complication (HCC) - Primary   A1C looks better today. F/U in 4 months.  Calling to get updated eye exam that he just had done.  Due for urine microalbumin today.      Relevant Orders   POCT HgB A1C (Completed)   Lipid Panel With LDL/HDL Ratio   PSA  Urine Microalbumin w/creat. ratio     Other   Elevated PSA, less than 10 ng/ml   Relevant Orders   Lipid Panel With LDL/HDL Ratio   PSA   Other Visit Diagnoses       Burning sensation of toe and foot          Regards to the burning sensation in his toes-we discussed just keeping an eye on if it comes back let me know most likely sounds like a nerve issue could be a neuroma could be a deficiency could be related to some of his back pain but if it returns we can always work it up further.  PSA you normally is between 4 and 5 so we have been checking it yearly.  Due for repeat today.  Return in about 4 months (around 05/26/2024) for Diabetes follow-up.    [name], MD

## 2024-01-25 NOTE — Assessment & Plan Note (Addendum)
 A1C looks better today. F/U in 4 months.  Calling to get updated eye exam that he just had done.  Due for urine microalbumin today.

## 2024-01-25 NOTE — Assessment & Plan Note (Addendum)
 BP at goal.  Weight is down 5 lbs. On Jardiance ,half tab daily. Still working on diet. No volume overload.

## 2024-01-26 ENCOUNTER — Ambulatory Visit: Payer: Self-pay | Admitting: Family Medicine

## 2024-01-26 LAB — PSA: Prostate Specific Ag, Serum: 3.4 ng/mL (ref 0.0–4.0)

## 2024-01-26 LAB — LIPID PANEL WITH LDL/HDL RATIO
Cholesterol, Total: 149 mg/dL (ref 100–199)
HDL: 38 mg/dL — ABNORMAL LOW (ref >39)
LDL Chol Calc (NIH): 83 mg/dL (ref 0–99)
LDL/HDL Ratio: 2.2 ratio (ref 0.0–3.6)
Triglycerides: 164 mg/dL — ABNORMAL HIGH (ref 0–149)
VLDL Cholesterol Cal: 28 mg/dL (ref 5–40)

## 2024-01-26 LAB — MICROALBUMIN / CREATININE URINE RATIO
Creatinine, Urine: 73.7 mg/dL
Microalb/Creat Ratio: 7 mg/g{creat} (ref 0–29)
Microalbumin, Urine: 5.4 ug/mL

## 2024-01-26 NOTE — Progress Notes (Signed)
 No excess protein in the urine which is fantastic.

## 2024-01-26 NOTE — Progress Notes (Signed)
 Hi Macklen, triglycerides are still mildly elevated but look better than they did last year so great work.  LDL is significantly better.  Prostate test is normal.  Still awaiting urine protein test.

## 2024-02-01 ENCOUNTER — Other Ambulatory Visit (INDEPENDENT_AMBULATORY_CARE_PROVIDER_SITE_OTHER)

## 2024-02-01 ENCOUNTER — Ambulatory Visit (INDEPENDENT_AMBULATORY_CARE_PROVIDER_SITE_OTHER): Admitting: Sports Medicine

## 2024-02-01 ENCOUNTER — Encounter: Payer: Self-pay | Admitting: Sports Medicine

## 2024-02-01 DIAGNOSIS — M25552 Pain in left hip: Secondary | ICD-10-CM | POA: Diagnosis not present

## 2024-02-01 DIAGNOSIS — G8929 Other chronic pain: Secondary | ICD-10-CM | POA: Diagnosis not present

## 2024-02-01 DIAGNOSIS — M1612 Unilateral primary osteoarthritis, left hip: Secondary | ICD-10-CM | POA: Diagnosis not present

## 2024-02-01 MED ORDER — TRIAMCINOLONE ACETONIDE 40 MG/ML IJ SUSP
40.0000 mg | Freq: Once | INTRAMUSCULAR | Status: AC
  Administered 2024-02-01: 40 mg via INTRA_ARTICULAR

## 2024-02-01 NOTE — Addendum Note (Signed)
 Addended by: OLIVA-AVELLANEDA, Lacy Sofia L on: 02/01/2024 09:53 AM   Modules accepted: Orders

## 2024-02-01 NOTE — Assessment & Plan Note (Signed)
 Chronic pain left hip, x-ray showed osteoarthritis, did not respond sufficiently well to arthritis with Tylenol , home PT, we did hip joint injection today. We are avoiding NSAIDs due to CHF, return to see me 6 weeks.   Of note he described an episode few nights ago that sounds like vasovagal presyncope, however what we do not know is if this was an anginal episode, he has no chest pain currently but I have advised him to talk to his cardiologist about this.

## 2024-02-01 NOTE — Progress Notes (Signed)
    Procedures performed today:    Procedure: Real-time Ultrasound Guided injection of the left hip joint Device: Samsung HS60  Verbal informed consent obtained.  Time-out conducted.  Noted no overlying erythema, induration, or other signs of local infection.  Skin prepped in a sterile fashion.  Local anesthesia: Topical Ethyl chloride.  With sterile technique and under real time ultrasound guidance: Arthritic joint noted, 1 cc Kenalog  40, 2 cc lidocaine, 2 cc bupivacaine injected easily Completed without difficulty  Advised to call if fevers/chills, erythema, induration, drainage, or persistent bleeding.  Images permanently stored and available for review in PACS.  Impression: Technically successful ultrasound guided injection.  Independent interpretation of notes and tests performed by another provider:   None.  Brief History, Exam, Impression, and Recommendations:    Primary osteoarthritis of left hip Chronic pain left hip, x-ray showed osteoarthritis, did not respond sufficiently well to arthritis with Tylenol , home PT, we did hip joint injection today. We are avoiding NSAIDs due to CHF, return to see me 6 weeks.   Of note he described an episode few nights ago that sounds like vasovagal presyncope, however what we do not know is if this was an anginal episode, he has no chest pain currently but I have advised him to talk to his cardiologist about this.    ____________________________________________ Debby PARAS. Curtis, M.D., ABFM., CAQSM., AME. Primary Care and Sports Medicine [facility]  Adjunct Professor of [facility] Medicine  [employer]  Restaurant manager, fast food

## 2024-02-14 DIAGNOSIS — K529 Noninfective gastroenteritis and colitis, unspecified: Secondary | ICD-10-CM | POA: Diagnosis not present

## 2024-02-14 DIAGNOSIS — K219 Gastro-esophageal reflux disease without esophagitis: Secondary | ICD-10-CM | POA: Diagnosis not present

## 2024-02-16 DIAGNOSIS — I428 Other cardiomyopathies: Secondary | ICD-10-CM | POA: Diagnosis not present

## 2024-02-16 DIAGNOSIS — I5022 Chronic systolic (congestive) heart failure: Secondary | ICD-10-CM | POA: Diagnosis not present

## 2024-02-16 DIAGNOSIS — Z95810 Presence of automatic (implantable) cardiac defibrillator: Secondary | ICD-10-CM | POA: Diagnosis not present

## 2024-02-27 ENCOUNTER — Encounter: Payer: Self-pay | Admitting: Sports Medicine

## 2024-03-14 ENCOUNTER — Ambulatory Visit: Admitting: Sports Medicine

## 2024-04-14 ENCOUNTER — Other Ambulatory Visit: Payer: Self-pay | Admitting: Family Medicine

## 2024-04-14 DIAGNOSIS — E119 Type 2 diabetes mellitus without complications: Secondary | ICD-10-CM

## 2024-04-24 DIAGNOSIS — I429 Cardiomyopathy, unspecified: Secondary | ICD-10-CM | POA: Diagnosis not present

## 2024-04-24 DIAGNOSIS — Z95810 Presence of automatic (implantable) cardiac defibrillator: Secondary | ICD-10-CM | POA: Diagnosis not present

## 2024-05-14 ENCOUNTER — Encounter: Payer: Self-pay | Admitting: Family Medicine

## 2024-05-14 ENCOUNTER — Ambulatory Visit (INDEPENDENT_AMBULATORY_CARE_PROVIDER_SITE_OTHER): Admitting: Family Medicine

## 2024-05-14 VITALS — BP 122/65 | HR 65 | Ht 67.0 in | Wt 243.0 lb

## 2024-05-14 DIAGNOSIS — E119 Type 2 diabetes mellitus without complications: Secondary | ICD-10-CM | POA: Diagnosis not present

## 2024-05-14 DIAGNOSIS — E1159 Type 2 diabetes mellitus with other circulatory complications: Secondary | ICD-10-CM | POA: Diagnosis not present

## 2024-05-14 DIAGNOSIS — M51362 Other intervertebral disc degeneration, lumbar region with discogenic back pain and lower extremity pain: Secondary | ICD-10-CM | POA: Diagnosis not present

## 2024-05-14 DIAGNOSIS — I5022 Chronic systolic (congestive) heart failure: Secondary | ICD-10-CM | POA: Diagnosis not present

## 2024-05-14 DIAGNOSIS — L603 Nail dystrophy: Secondary | ICD-10-CM | POA: Diagnosis not present

## 2024-05-14 DIAGNOSIS — Z7984 Long term (current) use of oral hypoglycemic drugs: Secondary | ICD-10-CM

## 2024-05-14 LAB — POCT GLYCOSYLATED HEMOGLOBIN (HGB A1C): Hemoglobin A1C: 7 % — AB (ref 4.0–5.6)

## 2024-05-14 NOTE — Assessment & Plan Note (Signed)
 Type 2 diabetes mellitus A1c increased to 7.0%. Resumed physical activity and working on weight loss for glycemic control. - Continue current diabetes management plan. - Encouraged weight loss of 5-10 pounds to improve A1c. - Encouraged continuation of physical activity and dietary modifications.

## 2024-05-14 NOTE — Assessment & Plan Note (Addendum)
  Weight loss goal to improve health and diabetes management. Engaged in weight loss challenge. - Encouraged dietary modifications focusing on portion control and increased protein and vegetable intake. - Encouraged continuation of physical activity and weight loss efforts.

## 2024-05-14 NOTE — Progress Notes (Signed)
 Established Patient Office Visit  Patient ID: Frederick Yang, male    DOB: Aug 22, 1960  Age: 63 y.o. MRN: [id number] PCP: Alvan Frederick BIRCH, MD  Chief Complaint  Patient presents with   Diabetes   Hypertension    Subjective:     HPI  Discussed the use of AI scribe software for clinical note transcription with the patient, who gave verbal consent to proceed.  History of Present Illness Frederick Yang is a 63 year old male who presents with concerns about toenail fungus and elevated A1c levels.  Onychomycosis - Big toenail appears abnormal,. - Chronic use of liquid antifungal product  initially for athlete's foot.  Glycemic control - A1c increased to 7.0 from 6.8. - Recently resumed physical activity, including walking and jogging after a period of inactivity due to heart issues. Now up to 1 mile - Exercise routine includes jogging short distances and walking inclines, covering over a mile. - Actively attempting weight loss with a goal of 200 pounds. - Participating in a weight loss contest with a friend.  Low back pain with radiculopathy - Persistent back pain radiating down the leg. - Pain affects sleep and is aggravated by certain positions. - Recent course of steroids and muscle relaxants from the [place] for symptom management. - Uses Tylenol  Arthritis twice daily and mentholated cream for relief. - Performs stretching exercises to alleviate symptoms.  Chronic cough - Persistent cough producing clear, foamy mucus. - Cough improved while taking Lasix  for two weeks but caused urinary issues. - Lasix  discontinued two weeks ago; cough has returned. - Monitors for swelling; no significant changes in extremities.     ROS    Objective:     BP 122/65   Pulse 65   Ht 5' 7 (1.702 m)   Wt 243 lb (110.2 kg)   SpO2 100%   BMI 38.06 kg/m    Physical Exam Vitals and nursing note reviewed.  Constitutional:      Appearance: Normal appearance.  HENT:     Head:  Normocephalic and atraumatic.  Eyes:     Conjunctiva/sclera: Conjunctivae normal.  Cardiovascular:     Rate and Rhythm: Normal rate and regular rhythm.  Pulmonary:     Effort: Pulmonary effort is normal.     Breath sounds: Normal breath sounds.  Musculoskeletal:     Comments: No foot edema  Skin:    General: Skin is warm and dry.     Comments: On right great toenail along the borders the nail is raised a little bit and appears more white.  Neurological:     Mental Status: He is alert.  Psychiatric:        Mood and Affect: Mood normal.      Results for orders placed or performed in visit on 05/14/24  POCT HgB A1C  Result Value Ref Range   Hemoglobin A1C 7.0 (A) 4.0 - 5.6 %   HbA1c POC (<> result, manual entry)     HbA1c, POC (prediabetic range)     HbA1c, POC (controlled diabetic range)        The 10-year ASCVD risk score (Arnett DK, et al., 2019) is: 14.1%    Assessment & Plan:   Problem List Items Addressed This Visit       Cardiovascular and Mediastinum   Type 2 diabetes mellitus with cardiac complication (HCC) - Primary   Type 2 diabetes mellitus A1c increased to 7.0%. Resumed physical activity and working on weight loss for glycemic control. -  Continue current diabetes management plan. - Encouraged weight loss of 5-10 pounds to improve A1c. - Encouraged continuation of physical activity and dietary modifications.      Relevant Orders   CMP14+EGFR   POCT HgB A1C (Completed)   CBC with Differential/Platelet   Pro b natriuretic peptide (BNP)[facility])   Urine Microalbumin w/creat. ratio   Chronic systolic congestive heart failure (HCC)    Heart failure with intermittent fluid overload Intermittent fluid overload managed with Lasix , causing urinary side effects. Plan to adjust dosing. - Trial Lasix  twice a week to manage fluid overload. - Monitor for urinary side effects and adjust frequency as needed.      Relevant Orders    CMP14+EGFR   CBC with Differential/Platelet   Pro b natriuretic peptide (BNP)[facility])   Urine Microalbumin w/creat. ratio     Musculoskeletal and Integument   DDD (degenerative disc disease), lumbar     Chronic low back pain with radiculopathy Chronic pain with radiculopathy, temporary relief from steroids and muscle relaxants. Using Tylenol  and mentholated cream for symptoms. - Continue arthritis Tylenol  as needed. - Continue mentholated cream for symptomatic relief. - Encouraged continuation of stretching exercises.        Other   Severe obesity (BMI 35.0-39.9) with comorbidity (HCC)    Weight loss goal to improve health and diabetes management. Engaged in weight loss challenge. - Encouraged dietary modifications focusing on portion control and increased protein and vegetable intake. - Encouraged continuation of physical activity and weight loss efforts.       Other Visit Diagnoses       Nail dystrophy       Relevant Orders   Ambulatory referral to Podiatry       Assessment and Plan Assessment & Plan Onychomycosis (toenail fungal infection) Chronic infection requiring confirmation and treatment identification before oral antifungal therapy. - Referred to podiatrist for fungal culture and treatment identification.      Return in about 3 months (around 08/14/2024) for DM.    Frederick Byars, MD [facility] at [facility]

## 2024-05-14 NOTE — Assessment & Plan Note (Signed)
  Heart failure with intermittent fluid overload Intermittent fluid overload managed with Lasix , causing urinary side effects. Plan to adjust dosing. - Trial Lasix  twice a week to manage fluid overload. - Monitor for urinary side effects and adjust frequency as needed.

## 2024-05-14 NOTE — Assessment & Plan Note (Signed)
   Chronic low back pain with radiculopathy Chronic pain with radiculopathy, temporary relief from steroids and muscle relaxants. Using Tylenol  and mentholated cream for symptoms. - Continue arthritis Tylenol  as needed. - Continue mentholated cream for symptomatic relief. - Encouraged continuation of stretching exercises.

## 2024-05-15 ENCOUNTER — Ambulatory Visit: Payer: Self-pay | Admitting: Family Medicine

## 2024-05-15 LAB — CBC WITH DIFFERENTIAL/PLATELET
Basophils Absolute: 0.1 x10E3/uL (ref 0.0–0.2)
Basos: 1 %
EOS (ABSOLUTE): 0.1 x10E3/uL (ref 0.0–0.4)
Eos: 2 %
Hematocrit: 50.8 % (ref 37.5–51.0)
Hemoglobin: 16.1 g/dL (ref 13.0–17.7)
Immature Grans (Abs): 0 x10E3/uL (ref 0.0–0.1)
Immature Granulocytes: 0 %
Lymphocytes Absolute: 2.1 x10E3/uL (ref 0.7–3.1)
Lymphs: 43 %
MCH: 29.4 pg (ref 26.6–33.0)
MCHC: 31.7 g/dL (ref 31.5–35.7)
MCV: 93 fL (ref 79–97)
Monocytes Absolute: 0.5 x10E3/uL (ref 0.1–0.9)
Monocytes: 10 %
Neutrophils Absolute: 2.2 x10E3/uL (ref 1.4–7.0)
Neutrophils: 44 %
Platelets: 191 x10E3/uL (ref 150–450)
RBC: 5.47 x10E6/uL (ref 4.14–5.80)
RDW: 12.3 % (ref 11.6–15.4)
WBC: 4.9 x10E3/uL (ref 3.4–10.8)

## 2024-05-15 LAB — MICROALBUMIN / CREATININE URINE RATIO
Creatinine, Urine: 83.4 mg/dL
Microalb/Creat Ratio: <4 mg/g{creat} (ref 0–29)
Microalbumin, Urine: <3 ug/mL

## 2024-05-15 LAB — CMP14+EGFR
ALT: 13 IU/L (ref 0–44)
AST: 11 IU/L (ref 0–40)
Albumin: 4.3 g/dL (ref 3.8–4.9)
Alkaline Phosphatase: 70 IU/L (ref 47–123)
BUN/Creatinine Ratio: 11 (ref 10–24)
BUN: 9 mg/dL (ref 8–27)
Bilirubin Total: 0.6 mg/dL (ref 0.0–1.2)
CO2: 21 mmol/L (ref 20–29)
Calcium: 9.7 mg/dL (ref 8.6–10.2)
Chloride: 100 mmol/L (ref 96–106)
Creatinine, Ser: 0.85 mg/dL (ref 0.76–1.27)
Globulin, Total: 2.4 g/dL (ref 1.5–4.5)
Glucose: 133 mg/dL — ABNORMAL HIGH (ref 70–99)
Potassium: 4.5 mmol/L (ref 3.5–5.2)
Sodium: 139 mmol/L (ref 134–144)
Total Protein: 6.7 g/dL (ref 6.0–8.5)
eGFR: 99 mL/min/1.73 (ref >59)

## 2024-05-15 LAB — SPECIMEN STATUS REPORT

## 2024-05-15 LAB — PRO B NATRIURETIC PEPTIDE: NT-Pro BNP: 229 pg/mL — ABNORMAL HIGH (ref 0–210)

## 2024-05-15 NOTE — Progress Notes (Signed)
 Hi Frederick Yang, your BNP is up a little.   So let do the plan as we chatted about, maybe taking lasix  twice a week.

## 2024-05-16 ENCOUNTER — Ambulatory Visit (INDEPENDENT_AMBULATORY_CARE_PROVIDER_SITE_OTHER): Admitting: Podiatry

## 2024-05-16 ENCOUNTER — Ambulatory Visit: Admitting: Podiatry

## 2024-05-16 DIAGNOSIS — Z91199 Patient's noncompliance with other medical treatment and regimen due to unspecified reason: Secondary | ICD-10-CM

## 2024-05-16 NOTE — Progress Notes (Signed)
 Cancel 24 hours

## 2024-05-17 ENCOUNTER — Ambulatory Visit: Admitting: Podiatry

## 2024-05-17 ENCOUNTER — Encounter: Payer: Self-pay | Admitting: Podiatry

## 2024-05-17 DIAGNOSIS — B351 Tinea unguium: Secondary | ICD-10-CM | POA: Diagnosis not present

## 2024-05-17 NOTE — Progress Notes (Signed)
  Subjective:  Patient ID: Frederick Yang, male    DOB: 06-03-1961,   MRN: [id number]  Chief Complaint  Patient presents with   Diabetes    This toe, right big toe, may have a fungus.  I'd like her to look at the left one too.  Saw Dr. [name] - 05/14/2024; A1c - 7.0    63 y.o. male presents for concern of nail changes. Relates these have bene ongoing for years but recently noted the right great toenail getting worse. He has been using some OTC treatments and didn't help much.  . Denies any other pedal complaints. Denies n/v/f/c.   Past Medical History:  Diagnosis Date   Congestive heart failure, NYHA class 3 (HCC) 06/29/2019   Diabetes mellitus without complication (HCC)    GERD (gastroesophageal reflux disease)    Hepatic steatosis    Hyperlipidemia    Prostate hypertrophy     Objective:  Physical Exam: Vascular: DP/PT pulses 2/4 bilateral. CFT <3 seconds. Normal hair growth on digits. No edema.  Skin. No lacerations or abrasions bilateral feet. Bilatearl hallux nails thickened and dystrophic distally with split in left hallux nail.  Musculoskeletal: MMT 5/5 bilateral lower extremities in DF, PF, Inversion and Eversion. Deceased ROM in DF of ankle joint.  Neurological: Sensation intact to light touch.   Assessment:   1. Onychomycosis      Plan:  Patient was evaluated and treated and all questions answered. -Examined patient -Discussed treatment options for painful dystrophic nails  -Clinical picture and Fungal culture was obtained by removing a portion of the hard nail itself from each of the involved toenails using a sterile nail nipper and sent to [facility] lab. Patient tolerated the biopsy procedure well without discomfort or need for anesthesia.  -Discussed fungal nail treatment options including oral, topical, and laser treatments.  -Patient to return in 4 weeks for follow up evaluation and discussion of fungal culture results or sooner if symptoms  worsen.   Asberry Failing, DPM

## 2024-05-17 NOTE — Addendum Note (Signed)
 Addended by: WAYLAN ELIDIA PARAS on: 05/17/2024 01:21 PM   Modules accepted: Orders

## 2024-05-17 NOTE — Progress Notes (Signed)
 Hi Marwin, no excess protein in the urine which looks great.  Recommend repeat next year

## 2024-05-27 ENCOUNTER — Other Ambulatory Visit: Payer: Self-pay | Admitting: Podiatry

## 2024-06-14 ENCOUNTER — Ambulatory Visit: Admitting: Podiatry

## 2024-06-14 ENCOUNTER — Encounter: Payer: Self-pay | Admitting: Podiatry

## 2024-06-14 DIAGNOSIS — B351 Tinea unguium: Secondary | ICD-10-CM

## 2024-06-14 NOTE — Progress Notes (Signed)
"  °  Subjective:  Patient ID: Frederick Yang, male    DOB: 1960-12-27,   MRN: [id number]  Chief Complaint  Patient presents with   Nail Problem    She took a sample of my nail to see what's going on with it.  I'm here to find out what's going on.    63 y.o. male presents for follow-up of fungal nails and to discuss culture results.. Denies any other pedal complaints. Denies n/v/f/c.   Past Medical History:  Diagnosis Date   Congestive heart failure, NYHA class 3 (HCC) 06/29/2019   Diabetes mellitus without complication (HCC)    GERD (gastroesophageal reflux disease)    Hepatic steatosis    Hyperlipidemia    Prostate hypertrophy     Objective:  Physical Exam: Vascular: DP/PT pulses 2/4 bilateral. CFT <3 seconds. Normal hair growth on digits. No edema.  Skin. No lacerations or abrasions bilateral feet. Bilatearl hallux nails thickened and dystrophic distally with split in left hallux nail.  Musculoskeletal: MMT 5/5 bilateral lower extremities in DF, PF, Inversion and Eversion. Deceased ROM in DF of ankle joint.  Neurological: Sensation intact to light touch.   Assessment:   1. Onychomycosis      Plan:  Patient was evaluated and treated and all questions answered. -Examined patient -Discussed treatment options for painful dystrophic nails  -Culture positive for T rubrum -Discussed fungal nail treatment options including oral, topical, and laser treatments.  -Patient to return in 3 months for recheck  Asberry Failing, DPM    "

## 2024-06-24 ENCOUNTER — Other Ambulatory Visit: Payer: Self-pay | Admitting: Podiatry

## 2024-06-24 ENCOUNTER — Telehealth: Payer: Self-pay | Admitting: Lab

## 2024-06-24 MED ORDER — TERBINAFINE HCL 250 MG PO TABS: 250.0000 mg | ORAL_TABLET | Freq: Every day | ORAL | 0 refills | Status: AC

## 2024-06-24 NOTE — Telephone Encounter (Signed)
Patient is aware medication sent.

## 2024-06-24 NOTE — Telephone Encounter (Signed)
 Patient states Lamisil was to be called in last week and nothing at pharmacy please advise.

## 2024-07-03 ENCOUNTER — Ambulatory Visit (INDEPENDENT_AMBULATORY_CARE_PROVIDER_SITE_OTHER): Admitting: Family Medicine

## 2024-07-03 VITALS — BP 125/61 | HR 97 | Temp 98.6°F | Ht 67.0 in | Wt 236.0 lb

## 2024-07-03 DIAGNOSIS — R051 Acute cough: Secondary | ICD-10-CM | POA: Diagnosis not present

## 2024-07-03 DIAGNOSIS — I5022 Chronic systolic (congestive) heart failure: Secondary | ICD-10-CM

## 2024-07-03 DIAGNOSIS — I420 Dilated cardiomyopathy: Secondary | ICD-10-CM

## 2024-07-03 DIAGNOSIS — R5383 Other fatigue: Secondary | ICD-10-CM

## 2024-07-03 LAB — POCT URINALYSIS DIP (CLINITEK)
Bilirubin, UA: NEGATIVE
Blood, UA: NEGATIVE
Glucose, UA: >=1000 mg/dL — AB
Ketones, POC UA: NEGATIVE mg/dL
Leukocytes, UA: NEGATIVE
Nitrite, UA: NEGATIVE
POC PROTEIN,UA: NEGATIVE
Spec Grav, UA: <=1.005 — AB
Urobilinogen, UA: 0.2 U/dL
pH, UA: 5.5

## 2024-07-03 NOTE — Progress Notes (Signed)
 "  Acute Office Visit  Patient ID: Frederick Yang, male    DOB: 02-17-1961, 64 y.o.   MRN: [id number]  PCP: Alvan Dorothyann BIRCH, MD  Chief Complaint  Patient presents with   Cough   Fatigue    Subjective:     HPI  Discussed the use of AI scribe software for clinical note transcription with the patient, who gave verbal consent to proceed.  History of Present Illness Frederick Yang is a 64 year old male with a history of heart issues who presents with persistent fatigue and weakness following a respiratory illness.  Fatigue and weakness - Persistent fatigue and weakness since respiratory illness onset around December 21st - Describes feeling 'beat down' and 'run down,' similar to prior episodes of cardiac decompensation - Energy levels fluctuate, with 'good day, bad day' pattern - Able to attend family dinner on a day with improved energy, but felt worn out the following day  Respiratory symptoms - Onset of cough and congestion around December 21st, with cough as the most prominent symptom - Tested negative for COVID-19, influenza A/B, and RSV at [facility] walk-in clinic - Treated with cough pearls, nighttime cough syrup, and decongestant tablets twice daily - Symptoms mostly resolved after two weeks, with occasional productive cough and phlegm - No current wheezing; occasional nocturnal wheezing during the first week, now improved  Gastrointestinal symptoms and weight changes - Loss of appetite during the first two weeks of illness, resulting in 8-9 pound weight loss - Appetite has returned to normal - No loose stools or significant nausea; slight nausea present but not significant - Bowel movements remain normal  Cardiac history and monitoring - History of heart issues with prior episode of unexpected cardiac decompensation - Recent echocardiogram performed through [employer] for benefits purposes; results not shared with cardiologist - Monitors weight and fluid retention  regularly, with no significant changes or swelling  Genitourinary symptoms and medication adherence - Recently resumed prostate medication (tavalisse) after difficulty obtaining a refill - Experienced urinary discomfort during lapse in medication, which improved after resuming - Taking furosemide  every other day without discomfort   ROS     Objective:    BP 125/61   Pulse 97   Temp 98.6 F (37 C) (Oral)   Ht 5' 7 (1.702 m)   Wt 236 lb (107 kg)   SpO2 99%   BMI 36.96 kg/m    Physical Exam Constitutional:      Appearance: Normal appearance.  HENT:     Head: Normocephalic and atraumatic.     Right Ear: Tympanic membrane, ear canal and external ear normal. There is no impacted cerumen.     Left Ear: Tympanic membrane, ear canal and external ear normal. There is no impacted cerumen.     Nose: Nose normal.     Mouth/Throat:     Pharynx: Oropharynx is clear.  Eyes:     Conjunctiva/sclera: Conjunctivae normal.  Cardiovascular:     Rate and Rhythm: Normal rate and regular rhythm.  Pulmonary:     Effort: Pulmonary effort is normal.     Breath sounds: Normal breath sounds.  Musculoskeletal:     Cervical back: Neck supple. No tenderness.  Lymphadenopathy:     Cervical: No cervical adenopathy.  Skin:    General: Skin is warm and dry.  Neurological:     Mental Status: He is alert and oriented to person, place, and time.  Psychiatric:        Mood and Affect: Mood  normal.       No results found for any visits on 07/03/24.     Assessment & Plan:   Problem List Items Addressed This Visit       Cardiovascular and Mediastinum   Dilated cardiomyopathy (HCC)   Relevant Orders   Pro b natriuretic peptide (BNP)[facility]   Basic Metabolic Panel (BMET)   TSH   CBC with Differential/Platelet   Chronic systolic congestive heart failure (HCC)   Relevant Orders   Pro b natriuretic peptide (BNP)[facility])   Basic Metabolic  Panel (BMET)   TSH   CBC with Differential/Platelet     Other   Severe obesity (BMI 35.0-39.9) with comorbidity (HCC)   Other Visit Diagnoses       Acute cough    -  Primary   Relevant Orders   Pro b natriuretic peptide (BNP)[facility])   Basic Metabolic Panel (BMET)   TSH   CBC with Differential/Platelet     Low energy       Relevant Orders   Pro b natriuretic peptide (BNP)[facility])   Basic Metabolic Panel (BMET)   TSH   CBC with Differential/Platelet       Assessment and Plan Assessment & Plan Acute cough and fatigue Cough and fatigue starting around July 17, 2024. Negative for COVID, flu A, flu B, and RSV. Symptoms improved with medication but fatigue persists. Differential includes viral infection or possible cardiac involvement. - Ordered blood work to check white cell count and BNP levels. - Consider chest x-ray and echocardiogram if symptoms persist or worsen. - Monitor symptoms over the weekend and reassess if no improvement.  Chronic systolic heart failure due to dilated cardiomyopathy Chronic condition with recent fatigue and weakness, similar to previous heart failure exacerbations. No significant edema or weight gain. Current symptoms may be related to recent viral illness or heart failure exacerbation. - Ordered BNP to assess for volume overload. - Continue furosemide  every other day as tolerated. - Monitor for signs of heart failure exacerbation.  Benign prostatic hyperplasia with lower urinary tract symptoms Recent exacerbation of urinary symptoms due to lapse in medication. Symptoms improved after resuming tamsulosin . - Continue tamsulosin  as prescribed. - Monitor urinary symptoms and follow up with urologist in February. - UA performed today.      No orders of the defined types were placed in this encounter.   No follow-ups on file.  [name], MD [facility]  Medicine at [facility]   "

## 2024-07-03 NOTE — Progress Notes (Signed)
 Pt reports that he was seen at the [facility] walk in clinic and was tested for flu/covid/rsv and he was negative. He was given medication for the congestion and cough and stopped taking those 2 days ago. He states that he just feels really fatigued.

## 2024-07-03 NOTE — Addendum Note (Signed)
 Addended by: FREYA BASCOM CROME on: 07/03/2024 05:02 PM   Modules accepted: Orders

## 2024-07-04 ENCOUNTER — Ambulatory Visit: Payer: Self-pay | Admitting: Family Medicine

## 2024-07-04 LAB — CBC WITH DIFFERENTIAL/PLATELET
Basophils Absolute: 0 x10E3/uL (ref 0.0–0.2)
Basos: 1 %
EOS (ABSOLUTE): 0.1 x10E3/uL (ref 0.0–0.4)
Eos: 2 %
Hematocrit: 50.5 % (ref 37.5–51.0)
Hemoglobin: 16.1 g/dL (ref 13.0–17.7)
Immature Grans (Abs): 0.1 x10E3/uL (ref 0.0–0.1)
Immature Granulocytes: 1 %
Lymphocytes Absolute: 2 x10E3/uL (ref 0.7–3.1)
Lymphs: 34 %
MCH: 28.9 pg (ref 26.6–33.0)
MCHC: 31.9 g/dL (ref 31.5–35.7)
MCV: 91 fL (ref 79–97)
Monocytes Absolute: 0.6 x10E3/uL (ref 0.1–0.9)
Monocytes: 10 %
Neutrophils Absolute: 3 x10E3/uL (ref 1.4–7.0)
Neutrophils: 52 %
Platelets: 219 x10E3/uL (ref 150–450)
RBC: 5.57 x10E6/uL (ref 4.14–5.80)
RDW: 12.6 % (ref 11.6–15.4)
WBC: 5.7 x10E3/uL (ref 3.4–10.8)

## 2024-07-04 LAB — BASIC METABOLIC PANEL WITH GFR
BUN/Creatinine Ratio: 16 (ref 10–24)
BUN: 13 mg/dL (ref 8–27)
CO2: 22 mmol/L (ref 20–29)
Calcium: 9.8 mg/dL (ref 8.6–10.2)
Chloride: 102 mmol/L (ref 96–106)
Creatinine, Ser: 0.8 mg/dL (ref 0.76–1.27)
Glucose: 217 mg/dL — ABNORMAL HIGH (ref 70–99)
Potassium: 4.3 mmol/L (ref 3.5–5.2)
Sodium: 139 mmol/L (ref 134–144)
eGFR: 101 mL/min/1.73

## 2024-07-04 LAB — TSH: TSH: 1.29 u[IU]/mL (ref 0.450–4.500)

## 2024-07-04 LAB — PRO B NATRIURETIC PEPTIDE: NT-Pro BNP: 144 pg/mL (ref 0–210)

## 2024-07-04 NOTE — Progress Notes (Signed)
 Hi Neven, metabolic panel overall looks good.  No concerns and your BNP actually looks really great!  So I think the every other day is working perfectly.  So lets just stick with that.  Your thyroid level looks perfect.  Blood counts normal no sign of systemic infection or anemia.  At this point I am just wondering if this is going to take just a little longer for you to bounce back to you feel comfortable giving it through the weekend to see if you feel like you really are going in the right direction?

## 2024-07-26 ENCOUNTER — Telehealth: Payer: Self-pay

## 2024-07-26 NOTE — Telephone Encounter (Signed)
 Patient advised to call cardiology for refill.

## 2024-07-26 NOTE — Telephone Encounter (Signed)
 Copied from CRM [id number]. Topic: Clinical - Medication Refill >> Jul 26, 2024 11:44 AM Donna BRAVO wrote: Medication: sacubitril -valsartan  (ENTRESTO ) 49-51 MG   Insurance will pay for the generic not name brand   Has the patient contacted their pharmacy? Yes Pharmacy says patient part $125  Patient unable to pay that much  This is the patient's preferred pharmacy:   [id number] - [place], [place] - [address] [place] [address] [place] [address] [place] [address] Phone: [phone] Fax: [phone]   Is this the correct pharmacy for this prescription? Yes If no, delete pharmacy and type the correct one.   Has the prescription been filled recently? No  Is the patient out of the medication? No  Has the patient been seen for an appointment in the last year OR does the patient have an upcoming appointment? Yes  Can we respond through MyChart? Yes  Agent: Please be advised that Rx refills may take up to 3 business days. We ask that you follow-up with your pharmacy.

## 2024-08-14 ENCOUNTER — Ambulatory Visit: Admitting: Family Medicine

## 2024-09-12 ENCOUNTER — Ambulatory Visit: Admitting: Podiatry
